# Patient Record
Sex: Female | Born: 1935 | Race: Black or African American | Hispanic: No | Smoking: Never smoker
Health system: Southern US, Community
[De-identification: ages and names within clinical notes are randomized; demographics above are authoritative.]

## PROBLEM LIST (undated history)

## (undated) DIAGNOSIS — Z8719 Personal history of other diseases of the digestive system: Secondary | ICD-10-CM

## (undated) DIAGNOSIS — J40 Bronchitis, not specified as acute or chronic: Secondary | ICD-10-CM

## (undated) DIAGNOSIS — R51 Headache: Secondary | ICD-10-CM

## (undated) DIAGNOSIS — F419 Anxiety disorder, unspecified: Secondary | ICD-10-CM

## (undated) DIAGNOSIS — J45909 Unspecified asthma, uncomplicated: Secondary | ICD-10-CM

## (undated) DIAGNOSIS — E119 Type 2 diabetes mellitus without complications: Secondary | ICD-10-CM

## (undated) DIAGNOSIS — D649 Anemia, unspecified: Secondary | ICD-10-CM

## (undated) DIAGNOSIS — I10 Essential (primary) hypertension: Secondary | ICD-10-CM

## (undated) DIAGNOSIS — I499 Cardiac arrhythmia, unspecified: Secondary | ICD-10-CM

## (undated) DIAGNOSIS — M1990 Unspecified osteoarthritis, unspecified site: Secondary | ICD-10-CM

## (undated) DIAGNOSIS — K219 Gastro-esophageal reflux disease without esophagitis: Secondary | ICD-10-CM

## (undated) DIAGNOSIS — E785 Hyperlipidemia, unspecified: Secondary | ICD-10-CM

## (undated) DIAGNOSIS — H269 Unspecified cataract: Secondary | ICD-10-CM

## (undated) DIAGNOSIS — G709 Myoneural disorder, unspecified: Secondary | ICD-10-CM

## (undated) HISTORY — PX: TUBAL LIGATION: SHX77

## (undated) HISTORY — PX: NECK SURGERY: SHX720

## (undated) HISTORY — PX: CARDIAC CATHETERIZATION: SHX172

## (undated) HISTORY — PX: DOPPLER ECHOCARDIOGRAPHY: SHX263

## (undated) HISTORY — PX: OTHER SURGICAL HISTORY: SHX169

---

## 1999-08-11 ENCOUNTER — Encounter: Payer: Self-pay | Admitting: Cardiology

## 1999-08-11 ENCOUNTER — Encounter: Admission: RE | Admit: 1999-08-11 | Discharge: 1999-08-11 | Payer: Self-pay | Admitting: Cardiology

## 1999-08-14 ENCOUNTER — Encounter: Payer: Self-pay | Admitting: Cardiology

## 1999-08-14 ENCOUNTER — Encounter: Admission: RE | Admit: 1999-08-14 | Discharge: 1999-08-14 | Payer: Self-pay | Admitting: Cardiology

## 1999-12-30 ENCOUNTER — Encounter (INDEPENDENT_AMBULATORY_CARE_PROVIDER_SITE_OTHER): Payer: Self-pay | Admitting: *Deleted

## 1999-12-30 ENCOUNTER — Ambulatory Visit (HOSPITAL_COMMUNITY): Admission: RE | Admit: 1999-12-30 | Discharge: 1999-12-30 | Payer: Self-pay | Admitting: Gastroenterology

## 2000-10-11 ENCOUNTER — Encounter: Payer: Self-pay | Admitting: Otolaryngology

## 2000-10-13 ENCOUNTER — Inpatient Hospital Stay (HOSPITAL_COMMUNITY): Admission: RE | Admit: 2000-10-13 | Discharge: 2000-10-14 | Payer: Self-pay | Admitting: Otolaryngology

## 2000-10-13 ENCOUNTER — Encounter (INDEPENDENT_AMBULATORY_CARE_PROVIDER_SITE_OTHER): Payer: Self-pay | Admitting: *Deleted

## 2001-05-10 ENCOUNTER — Encounter: Payer: Self-pay | Admitting: Cardiology

## 2001-05-10 ENCOUNTER — Encounter: Admission: RE | Admit: 2001-05-10 | Discharge: 2001-05-10 | Payer: Self-pay | Admitting: Cardiology

## 2002-10-06 ENCOUNTER — Ambulatory Visit: Admission: RE | Admit: 2002-10-06 | Discharge: 2002-10-06 | Payer: Self-pay | Admitting: Cardiology

## 2002-12-22 ENCOUNTER — Other Ambulatory Visit: Admission: RE | Admit: 2002-12-22 | Discharge: 2002-12-22 | Payer: Self-pay | Admitting: Obstetrics and Gynecology

## 2003-05-01 ENCOUNTER — Encounter: Admission: RE | Admit: 2003-05-01 | Discharge: 2003-05-01 | Payer: Self-pay | Admitting: Cardiology

## 2003-07-31 ENCOUNTER — Emergency Department (HOSPITAL_COMMUNITY): Admission: EM | Admit: 2003-07-31 | Discharge: 2003-07-31 | Payer: Self-pay | Admitting: Emergency Medicine

## 2003-08-13 ENCOUNTER — Ambulatory Visit (HOSPITAL_COMMUNITY): Admission: RE | Admit: 2003-08-13 | Discharge: 2003-08-13 | Payer: Self-pay | Admitting: Gastroenterology

## 2003-08-13 ENCOUNTER — Encounter (INDEPENDENT_AMBULATORY_CARE_PROVIDER_SITE_OTHER): Payer: Self-pay | Admitting: *Deleted

## 2003-08-30 ENCOUNTER — Ambulatory Visit (HOSPITAL_COMMUNITY): Admission: RE | Admit: 2003-08-30 | Discharge: 2003-08-30 | Payer: Self-pay | Admitting: Cardiology

## 2004-02-22 ENCOUNTER — Ambulatory Visit (HOSPITAL_COMMUNITY): Admission: RE | Admit: 2004-02-22 | Discharge: 2004-02-22 | Payer: Self-pay | Admitting: Gastroenterology

## 2004-09-17 ENCOUNTER — Ambulatory Visit (HOSPITAL_COMMUNITY): Admission: RE | Admit: 2004-09-17 | Discharge: 2004-09-17 | Payer: Self-pay | Admitting: Cardiology

## 2006-07-12 ENCOUNTER — Encounter: Admission: RE | Admit: 2006-07-12 | Discharge: 2006-07-12 | Payer: Self-pay | Admitting: Cardiology

## 2006-11-17 ENCOUNTER — Ambulatory Visit (HOSPITAL_COMMUNITY): Admission: RE | Admit: 2006-11-17 | Discharge: 2006-11-17 | Payer: Self-pay | Admitting: Gastroenterology

## 2007-02-10 ENCOUNTER — Encounter: Admission: RE | Admit: 2007-02-10 | Discharge: 2007-02-10 | Payer: Self-pay | Admitting: Cardiology

## 2007-06-08 ENCOUNTER — Ambulatory Visit (HOSPITAL_COMMUNITY): Admission: RE | Admit: 2007-06-08 | Discharge: 2007-06-08 | Payer: Self-pay | Admitting: Cardiology

## 2007-10-25 ENCOUNTER — Ambulatory Visit (HOSPITAL_COMMUNITY): Admission: RE | Admit: 2007-10-25 | Discharge: 2007-10-25 | Payer: Self-pay | Admitting: Cardiology

## 2007-10-27 ENCOUNTER — Encounter (INDEPENDENT_AMBULATORY_CARE_PROVIDER_SITE_OTHER): Payer: Self-pay | Admitting: Cardiology

## 2007-10-27 ENCOUNTER — Ambulatory Visit: Payer: Self-pay | Admitting: Vascular Surgery

## 2007-10-27 ENCOUNTER — Ambulatory Visit (HOSPITAL_COMMUNITY): Admission: RE | Admit: 2007-10-27 | Discharge: 2007-10-27 | Payer: Self-pay | Admitting: Cardiology

## 2009-07-12 ENCOUNTER — Encounter: Admission: RE | Admit: 2009-07-12 | Discharge: 2009-07-12 | Payer: Self-pay | Admitting: Cardiology

## 2010-03-13 ENCOUNTER — Encounter (INDEPENDENT_AMBULATORY_CARE_PROVIDER_SITE_OTHER): Payer: Self-pay | Admitting: Cardiology

## 2010-03-13 ENCOUNTER — Ambulatory Visit
Admission: RE | Admit: 2010-03-13 | Discharge: 2010-03-13 | Payer: Self-pay | Source: Home / Self Care | Admitting: Cardiology

## 2010-03-13 ENCOUNTER — Ambulatory Visit: Payer: Self-pay | Admitting: Vascular Surgery

## 2010-04-03 ENCOUNTER — Ambulatory Visit (HOSPITAL_COMMUNITY): Admission: RE | Admit: 2010-04-03 | Discharge: 2010-04-03 | Payer: Self-pay | Admitting: Gastroenterology

## 2010-06-29 ENCOUNTER — Encounter: Payer: Self-pay | Admitting: Cardiology

## 2010-10-09 ENCOUNTER — Inpatient Hospital Stay (HOSPITAL_BASED_OUTPATIENT_CLINIC_OR_DEPARTMENT_OTHER)
Admission: RE | Admit: 2010-10-09 | Discharge: 2010-10-09 | Disposition: A | Discharge status: Home / Self Care | Payer: Medicare Other | Source: Ambulatory Visit | Attending: Cardiology | Admitting: Cardiology

## 2010-10-09 DIAGNOSIS — I10 Essential (primary) hypertension: Secondary | ICD-10-CM | POA: Insufficient documentation

## 2010-10-09 DIAGNOSIS — K219 Gastro-esophageal reflux disease without esophagitis: Secondary | ICD-10-CM | POA: Insufficient documentation

## 2010-10-09 DIAGNOSIS — R42 Dizziness and giddiness: Secondary | ICD-10-CM | POA: Insufficient documentation

## 2010-10-09 DIAGNOSIS — I2510 Atherosclerotic heart disease of native coronary artery without angina pectoris: Secondary | ICD-10-CM | POA: Insufficient documentation

## 2010-10-09 DIAGNOSIS — E7800 Pure hypercholesterolemia, unspecified: Secondary | ICD-10-CM | POA: Insufficient documentation

## 2010-10-09 DIAGNOSIS — Z8673 Personal history of transient ischemic attack (TIA), and cerebral infarction without residual deficits: Secondary | ICD-10-CM | POA: Insufficient documentation

## 2010-10-09 DIAGNOSIS — R079 Chest pain, unspecified: Secondary | ICD-10-CM | POA: Insufficient documentation

## 2010-10-21 NOTE — Op Note (Signed)
Paula Pollard, Paula Pollard             ACCOUNT NO.:  [id number]   MEDICAL RECORD NO.:  [id number]          PATIENT TYPE:  AMB   LOCATION:  ENDO                         FACILITY:  [facility]   PHYSICIAN:  Anselmo Rod, M.D.  DATE OF BIRTH:  [birth year]   DATE OF PROCEDURE:  11/17/2006  DATE OF DISCHARGE:                               OPERATIVE REPORT   PROCEDURE PERFORMED:  Flexible sigmoidoscopy up to 90 cm.   ENDOSCOPIST:  Anselmo Rod, M.D.   INSTRUMENT USED:  Pentax video colonoscope.   INDICATION FOR PROCEDURE:  A 77 year old African-American female  undergoing a flexible sigmoidoscopy for acute abdominal pain.  Rule out  ischemic colitis, masses, polyps, etc.  The patient also has a family  history of colon cancer in her mother.   PREPROCEDURE PREPARATION:  Informed consent was procured from the  patient.  The patient was fasted for 8 hours prior to the procedure  after being prepped with a bottle of Miralax the night prior to the  procedure.  The risks and benefits of the procedure were discussed with  the patient in great detail.   PREPROCEDURE PHYSICAL:  VITAL SIGNS:  The patient had stable vital  signs.  NECK:  Supple.  CHEST:  Clear to auscultation.  S1, S2 regular.  ABDOMEN:  Soft with normal bowel sounds.   DESCRIPTION OF PROCEDURE:  The patient was placed in the left lateral  decubitus position and sedated with an additional 25 mcg of fentanyl and  2.5 mg of Versed given intravenously in slow incremental doses.  Once  the patient was adequately sedate and maintained on low-flow oxygen and  continuous cardiac monitoring, the Pentax video colonoscope was advanced  from the rectum to 90 cm without difficulty.  The patient had a fairly  good prep.  A few scattered diverticula were seen.  Small internal  hemorrhoids were appreciated.  No masses or polyps were identified.  The  procedure was done up to the mid-transverse colon.  The patient  tolerated the procedure well  without immediate complication.   IMPRESSION:  1. Few scattered diverticula seen up to 90 cm.  No other masses or      polyps were identified.  2. No evidence of ischemic colitis.  3. Small internal hemorrhoids seen on retroflexion in the rectum.   RECOMMENDATIONS:  1. Continue a high-fiber diet.  2. Avoid all nonsteroidals for now.  3. Outpatient follow-up as the need arises in the future.      Anselmo Rod, M.D.  Electronically Signed     JNM/MEDQ  D:  11/18/2006  T:  11/19/2006  Job:  [id number]   cc:   Osvaldo Shipper. Spruill, M.D.  Fax: [phone]

## 2010-10-24 NOTE — Op Note (Signed)
[place]  Patient:    Paula Pollard, Paula Pollard                    MRN: [id number] Proc. Date: 10/13/00 Adm. Date:  [id number] Attending:  Barbee Cough                           Operative Report  PREOPERATIVE DIAGNOSIS:  Right deep lobe parotid mass.  POSTOPERATIVE DIAGNOSIS:  Right deep lobe parotid mass.  OPERATION PERFORMED:  Right deep lobe parotidectomy with facial nerve dissection and continuous facial nerve monitoring.  SURGEON:  Kinnie Scales. Annalee Genta, M.D.  ASSISTANT:  Veverly Fells. Arletha Grippe, M.D.  ANESTHESIA:  General endotracheal.  COMPLICATIONS:  None.  ESTIMATED BLOOD LOSS:  Approximately 100 cc.  The patient was transferred from the operating room to the recovery room in stable condition.  INDICATIONS FOR PROCEDURE:  Ms. Hackley is a 77 year old white female who was referred for evaluation of a gradually enlarging mass in the right superior neck.  Evaluation revealed a firm, mobile mass posterior to the angle of the mandible.  MRI scanning was performed.  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  The patient had a past medical history which included mild hypertension and coronary artery disease.  Prior to surgery she was cleared for general anesthesia by her primary care physician, Dr. Shana Chute, given the patients findings of a gradually enlarging mass involving the right parotid gland and the MRI scan, I recommended to undertake parotidectomy under general anesthesia with nerve monitoring.  The risks, benefits and possible complications of this procedure were discussed in detail with the patient and family prior to surgery.  They understood and concurred with our plan for surgery which was scheduled as above.  DESCRIPTION OF PROCEDURE:  The patient was brought to the operating room at [facility] on Oct 13, 2000 and placed in supine position on the operating table.  General endotracheal anesthesia was established without difficulty.  When the patient had adequately been anesthetized, she was injected with 4 cc of 1% lidocaine, 1:100,000 epinephrine injected in the pre-existing skin crease along the anterior preauricular sulcus and extending into the superior aspect of the neck.  The patient was then prepped and draped in a sterile fashion and the nerve integrity monitoring system (NIMS) was placed and tested.  The procedure was begun by using a 15 scalpel to incise the skin along the preauricular sulcus extending below the right ear lobe and into the superior aspect of the right neck in a horizontal fashion in the pre-existing skin crease.  A musculocutaneous flap was then elevated over the anterior superficial periparotid fascia with skin and muscle reflected anteriorly.  The parotid gland was then delineated from the preauricular sulcus and auricular tragus.  Dissection was carried from superficial to deep along the tragal cartilage and then extended into the superior aspect of the neck.  The anterior belly of the digastric muscle was identified and mobilized posteriorly.  The gland was mobilized anterior and superficially.  This allowed access to the deep structures of the neck including the anterior and posterior bellies of the digastric muscle which were identified.  The overlying soft tissue was divided and elevated.  The greater auricular nerve was identified and divided and the common facial vein was divided in the deep  aspect of the neck.  The tumorous mass was identified in the deep lobe of the parotid extending from the deep lobe and elevating the inferior division of the facial nerve superiorly.  Attention was then turned to the preauricular sulcus and the common trunk of the facial nerve was identified as it exited the thyromastoid foramen.  Dissection was then carried out into the  right parotid gland.  Superior inferior divisions of the facial nerve were identified and the inferior division was followed throughout its course. Multiple branches were identified and preserved including cervical, marginal mandibular and buccal branches.  Using the NIMS monitor throughout this portion of the case, nerves were stimulated, followed and preserved.  The overlying parotid gland was dissected out laterally and large bulky posterior deep lobe parotid tumor was reflected inferiorly.  The mass was then resected from the deep aspect of the parotid gland and sent to pathology for gross and microscopic evaluation, common facial vein was again identified at the superior aspect of the dissection, divided and suture ligated, inferior and posterior aspects of the parotid gland were resected along with the deep lobe tumor.  The wound was then thoroughly irrigated with saline solution and the wound was then approximated in multiple layers after placement of a 7 mm Blake drain at the base of the incision which was carried out through a separate stab incision and sutured in position with a 2-0 silk suture.  Closure was achieved by reapproximating the periparotid fascia with the anterior aspect of the sternocleidomastoid muscle and preauricular fascia.  The deep skin closure was achieved with a 4-0 and 5-0 Vicryl suture in interrupted fashion.  Final skin closure achieved with a 4-0 and 5-0 Ethilon suture in a running locked fashion.  The patients wound was then cleaned and dressed with bacitracin ointment.  She was then awakened from her anesthetic, extubated without difficulty and was transferred from the operating room to the recovery room in stable condition.  There were no complications.  Estimated blood loss was 100 cc. DD:  10/13/00 TD:  10/13/00 Job: [id number]

## 2010-10-24 NOTE — Op Note (Signed)
Paula Pollard, Paula Pollard             ACCOUNT NO.:  [id number]   MEDICAL RECORD NO.:  [id number]          PATIENT TYPE:  AMB   LOCATION:  ENDO                         FACILITY:  [facility]   PHYSICIAN:  Anselmo Rod, M.D.  DATE OF BIRTH:  07-15-1929   DATE OF PROCEDURE:  11/18/2006  DATE OF DISCHARGE:                               OPERATIVE REPORT   PROCEDURE PERFORMED:  Esophagogastroduodenoscopy with biopsies.   ENDOSCOPIST:  Anselmo Rod, M.D.   INSTRUMENT USED:  Pentax video panendoscope.   INDICATIONS FOR PROCEDURE:  Guaiac positive stool and abdominal pain in  a 77 year old African American female, rule out peptic ulcer disease,  esophagitis, gastritis, gastroparesis.   PREPROCEDURE PREPARATION:  Informed consent was procured from the  patient.  The patient fasted for eight hours prior to the procedure.  Risks and benefits of the procedure were discussed with her in great  detail.   PREPROCEDURE PHYSICAL EXAMINATION:  VITAL SIGNS:  Stable.  NECK:  Supple.  CHEST:  Clear to auscultation.  CARDIOVASCULAR:  S1 and S2 regular.  ABDOMEN:  Soft with normal bowel sounds.   DESCRIPTION OF PROCEDURE:  The patient was placed in left lateral  decubitus position, sedated with 50 mcg of Fentanyl and 5 mg of Versed  given intravenously in slow incremental doses.  Once the patient was  adequately sedated and maintained on low flow oxygen and continuous  cardiac monitoring, the Pentax video panendoscope was advanced through  the mouthpiece, over the tongue and into the esophagus under direct  vision.  The entire esophagus was widely patent with no evidence of  ring, stricture, masses, esophagitis or Barrett's mucosa.  A small  hiatal hernia was seen on high retroflexion.  The entire gastric mucosa  of the proximal small-bowel appeared normal.   IMPRESSION:  Normal esophagogastroduodenoscopy except for a small hiatal  hernia.  No ulcers, erosions, masses or polyps seen.   RECOMMENDATIONS:  Proceed with flexible sigmoidoscopy at this time.  Further recommendations will be made in follow-up.  Avoid all  nonsteroidals for now.      Anselmo Rod, M.D.  Electronically Signed     JNM/MEDQ  D:  11/18/2006  T:  11/19/2006  Job:  [id number]   cc:   Osvaldo Shipper. Spruill, M.D.  Fax: [phone]

## 2010-10-24 NOTE — Op Note (Signed)
NAME:  Paula Pollard, Paula Pollard                       ACCOUNT NO.:  [id number]   MEDICAL RECORD NO.:  [id number]                   PATIENT TYPE:  AMB   LOCATION:  ENDO                                 FACILITY:  [facility]   PHYSICIAN:  Anselmo Rod, M.D.               DATE OF BIRTH:  12-26-1929   DATE OF PROCEDURE:  02/22/2004  DATE OF DISCHARGE:                                 OPERATIVE REPORT   PROCEDURE:  Esophagogastroduodenoscopy.   ENDOSCOPIST:  Anselmo Rod, M.D.   INSTRUMENT USED:  Olympus video pan endoscope.   INDICATIONS FOR PROCEDURE:  This is a 77 year old African-American female  with a history of epigastric pain and rectal bleeding.  Rule out peptic  ulcer disease, esophagitis, gastritis, etc.   PRE-PROCEDURE PREPARATION:  Informed consent was procured from the patient.  The patient fasted for eight hours prior to the procedure and prepped with a  bottle of magnesium citrate and a gallon of GoLYTELY the night prior to the  procedure.   PRE-PROCEDURE PHYSICAL:  VITAL SIGNS: Stable.  NECK: Supple.  CHEST:  Clear to auscultation.  CARDIOVASCULAR:  S1 and S2 are regular.  ABDOMEN:  Soft with normal bowel sounds.   DESCRIPTION OF PROCEDURE:  The patient was placed in the left lateral  decubitus position and sedated with 50 mg of Demerol and 5 mg of Versed in  slow incremental doses.  Once the patient was adequately sedated and  maintained on low flow oxygen and continuous cardiac monitoring, the Olympus  pan endoscope was advanced through the mouth piece, over the tongue and into  the esophagus under direct vision.  The entire esophagus appeared normal  with no evidence of ring, stricture, masses, esophagitis or Barrett's  mucosa.  The scope was then advanced into the stomach.  A small hiatal  hernia was seen on high retroflexion.  The entire gastric mucosa of the  proximal small bowel appeared normal.   IMPRESSION:  Normal esophagogastroduodenoscopy except for a small  hiatal  hernia.  No ulcers, masses or polyps seen.   RECOMMENDATIONS:  1.  Continue proton pump inhibitor.  2.  Avoid non-steroidals including aspirin for now.  3.  Outpatient follow-up within the next two weeks for further      recommendations.      JNM/MEDQ  D:  02/22/2004  T:  02/23/2004  Job:  [id number]   cc:   Osvaldo Shipper. Spruill, M.D.  [address] [address]  [place]  [place] [address]  Fax: [phone]

## 2010-10-24 NOTE — Op Note (Signed)
NAME:  Paula Pollard, Paula Pollard                       ACCOUNT NO.:  [id number]   MEDICAL RECORD NO.:  [id number]                   PATIENT TYPE:  AMB   LOCATION:  ENDO                                 FACILITY:  [facility]   PHYSICIAN:  Anselmo Rod, M.D.               DATE OF BIRTH:  1930-05-06   DATE OF PROCEDURE:  08/13/2003  DATE OF DISCHARGE:                                 OPERATIVE REPORT   PROCEDURE PERFORMED:  Colonoscopy with snare polypectomy x1.   ENDOSCOPIST:  Anselmo Rod, M.D.   INSTRUMENT USED:  Olympus video colonoscope.   INDICATION FOR PROCEDURE:  A 77 year old African-American female with a  history of colon cancer in her mother and a personal history of adenomatous  polyps with occasional rectal bleeding, undergoing a repeat colonoscopy to  rule out adenomas, masses, etc.   PREPROCEDURE PREPARATION:  Informed consent was procured from the patient.  The patient was fasted for eight hours prior to the procedure and prepped  with a bottle of magnesium citrate and a gallon of GoLYTELY the night prior  to the procedure.   PREPROCEDURE PHYSICAL:  VITAL SIGNS:  The patient had stable vital signs.  NECK:  Supple.  CHEST:  Clear to auscultation.  S1, S2 regular.  ABDOMEN:  Soft with normal bowel sounds.   DESCRIPTION OF PROCEDURE:  The patient was placed in the left lateral  decubitus position and sedated with 50 mg of Demerol and 5 mg of Versed  intravenously.  Once the patient was adequately sedate and maintained on low-  flow oxygen and continuous cardiac monitoring, the Olympus video colonoscope  was advanced from the rectum to the cecum.  A flat polyp was snared from the  proximal right colon.  There were scattered diverticula seen throughout the  colon.  The patient had small internal hemorrhoids seen on retroflexion.  There was a large amount of residual stool in the colon.  Multiple washes  were done.  The patient's position was changed from the left lateral to  the  supine and the right lateral position with gentle application of abdominal  pressure to reach the cecum.  The patient tolerated the procedure well  without complications.  Small lesions could have been missed.   IMPRESSION:  1. Flat polyp snared from proximal right colon.  2. Scattered diverticulosis.  3. Small internal hemorrhoids.   RECOMMENDATIONS:  1. Await pathology results.  2. Avoid nonsteroidals including aspirin for the next four weeks.  3. Outpatient follow-up in the next two weeks for further recommendations.                                               Anselmo Rod, M.D.    JNM/MEDQ  D:  08/13/2003  T:  08/14/2003  Job:  [id number]  cc:   Osvaldo Shipper. Spruill, M.D.  [address] [address]  [place]  [place] [address]  Fax: [phone]

## 2010-10-24 NOTE — Procedures (Signed)
[place]. [facility]  Patient:    Paula Pollard, Paula Pollard                    MRN: [id number] Proc. Date: 12/30/99 Attending:  Anselmo Rod, M.D. Dictator:   Anselmo Rod, M.D. CC:         Osvaldo Shipper. Spruill, M.D.                           Procedure Report  DATE OF BIRTH: June 28, 2029  PROCEDURE PERFORMED:  Colonoscopy with hot biopsies.  ENDOSCOPIST:  Anselmo Rod, M.D.  INSTRUMENT USED:  Olympus video colonoscope.  INDICATIONS:  Personal history of polyps, and a family history of colon cancer in a 77 year old black female, rule out recurrent polyps.  PREPROCEDURE PREPARATION:  Informed consent was received from the patient. The patient was fasted for eight hours prior to the procedure and prepped with a bottle of magnesium citrate and a gallon of NuLYTELY the night prior to the procedure.  PREPROCEDURE PHYSICAL EXAMINATION:  VITAL SIGNS:  Stable.  NECK:  Supple.  CHEST:  Clear to auscultation.  S1 and S2 regular.  ABDOMEN:  Soft, normal abdominal bowel sounds.  DESCRIPTION OF PROCEDURE:  The patient was placed in the left lateral decubitus position and sedated with 70 mg of Demerol and 7 mg of Versed intravenously.  Once the patient was adequately sedated and maintained on low flow oxygen and continuous cardiac monitoring, the Olympus video colonoscope was advanced in the rectum to the cecum without difficulty.  There were two small flat polyps seen in the right colon just distal to the cecum.  These were biopsied with by hot biopsy forceps for pathology.  No other masses or polyps were present.  There was some red stool still in the colon, and some left-sided diverticulosis noticed on examination.  The patient tolerated the procedure well without complication.  IMPRESSION: 1. Left-sided diverticulosis. 2. No large masses or polyps present. 3. Flat polyps present at right colon just distal to the cecum, biopsied with    hot biopsy forceps  for pathology. 4. Some residual stool in the colon, very small lesion.  RECOMMENDATIONS: 1. Avoid nonsteroidals. 2. Await pathology results. 3. Outpatient follow up in the next two weeks. DD:  12/30/99 TD:  12/31/99 Job: [id number]

## 2010-10-24 NOTE — Discharge Summary (Signed)
. [facility]  Patient:    Paula Pollard, Paula Pollard                    MRN: [id number] Adm. Date:  [id number] Disc. Date: [id number] Attending:  Barbee Cough CC:         Osvaldo Shipper. Spruill, M.D.   Discharge Summary  CONDITION AT DISCHARGE:  The patient is discharged to home in stable condition.  ADMISSION DIAGNOSIS:  Right parotid tumor.  DISCHARGE DIAGNOSIS:  Right parotid tumor.  SURGICAL PROCEDURE:  Right total parotidectomy with facial nerve dissection on Oct 13, 2000.  DISPOSITION:  The patient is discharged to home in stable condition in the company of her family.  DISCHARGE MEDICATIONS: Include her preoperative medications: 1. Dyazide one tablet p.o. q.d. 2. Tenormin 50 mg p.o. q.d. 3. Ultram one p.o. q.p.m. p.r.n. 4. Elavil 25 mg p.o. q.h.s. 5. Prevacid 30 mg p.o. q.d. 6. Prevacid 10 mg p.o. q.d.  Additional discharge medications include: 1. Augmentin 500 mg p.o. b.i.d. 2. Percocet 5/325 one to two tablets q.4-6h. p.r.n. for pain control,    dispensed #30 without refills.  DISCHARGE INSTRUCTIONS:  Activity:  Limited.  No lifting or straining.  Diet: No restrictions.  Wound care:  Half strength hydrogen peroxide followed by bacitracin ointment b.i.d.  The patient may bathe on Oct 16, 2000.  DISCHARGE FOLLOWUP:  She will follow up in my office on Oct 19, 2000, for additional postoperative care or sooner if warranted.  BRIEF ADMISSION HISTORY:  Paula Pollard is a 77 year old black female who was referred for evaluation of a gradually enlarging right superior neck mass. Evaluation including MRI scan showed an intraparotid tumor measuring approximately 2 x 3 cm in the deep posterior aspect of the right parotid gland.  Given the progressive nature of the patients parotid tumor, I recommended that we undertake right total parotidectomy.  The risks, benefits, and possible complications of this procedure were discussed in detail with  Paula Pollard and her family, they understood, and concurred with our plan.  Prior to surgery, medical evaluation and preoperative clearance was undertaken by the patients primary care physician, Dr. Donia Guiles.  HOSPITAL COURSE:  The patient was admitted to the ENT service on Oct 13, 2000. She was taken to the main operating room at [facility] and underwent a right total parotidectomy with facial nerve dissection under general anesthesia.  The patient was transferred from the operating room to the recovery room and from recovery to unit 5700 for postoperative care.  There were no intraoperative or postoperative complications or problems, and the patient tolerated her surgical procedure and general anesthesia without difficulty.  She was monitored for the first 23 hours after surgery and doing very well.  The patients JP drain output was monitored, and this fell rapidly after the first 8-hour period.  JP is removed on the first postoperative morning, Oct 14, 2000, and the patient is discharged to home in stable condition.  Examination reveals the parotid suture line to be intact with minimal crusting; no erythema, swelling, or discharge; and the patients facial nerve function is normal.  Vital signs are stable.  The patient has normal bowel and bladder function.  Pain control is adequate with Percocet, and she is tolerating a soft oral diet without difficulty.  She is discharged to home in stable condition with the above Discharge Instructions.  She is comfortable with this discharge planning and will follow up in my office in approximately  one week for further postoperative care. DD:  10/14/00 TD:  10/15/00 Job: [id number]

## 2010-10-26 NOTE — Cardiovascular Report (Signed)
Paula Pollard, Paula Pollard              ACCOUNT NO.:  [id number]  MEDICAL RECORD NO.:  [id number]          PATIENT TYPE:  LOCATION:                                 FACILITY:  PHYSICIAN:  Halina Asano N. Sharyn Lull, M.D. DATE OF BIRTH:  [birth year]  DATE OF PROCEDURE:  10/09/2010 DATE OF DISCHARGE:                           CARDIAC CATHETERIZATION   __________ left and right coronary angiography, LV graphy via right groin using Judkins technique.  INDICATIONS FOR PROCEDURE:  Paula Pollard is a 77 year old black female with past medical history significant for hypertension, hypercholesteremia, GERD, history of lacunar infarct in the past, complains of vague retrosternal chest pain radiating to the throat associated with feeling tired and weak and dizzy.  Denies any palpitation, lightheadedness, or syncope.  Denies nausea, vomiting, or diaphoresis.  States chest pain relieves with rest.  Denies relation of chest pain to food, breathing, or movement.  Denies any recent cardiac workup.  PAST MEDICAL HISTORY:  As above.  PAST SURGICAL HISTORY: 1. She had neck surgery in the past. 2. She had tubal ligation in the past.  MEDICATIONS AT HOME:  She is on: 1. Triamterene/hydrochlorothiazide 37.5/25 mg p.o. daily. 2. Losartan 100 mg p.o. daily. 3. Crestor 10 mg half tablet daily. 4. Enteric-coated aspirin 81 mg p.o. daily. 5. Dexilant 60 mg p.o. daily. 6. Vitamin D3, 1000 units daily.  ALLERGIES:  She is allergic to CODEINE.  SOCIAL HISTORY:  She is divorced, 4 children.  No history of alcohol or tobacco abuse.  She is retired.  FAMILY HISTORY:  Positive for coronary artery disease.  Father died of MI at the age of 87.  Mother died of old age at the age of 25, she also had MI.  One brother had cancer.  PHYSICAL EXAMINATION:  GENERAL:  She is alert, awake, oriented x3, in no acute distress. VITAL SIGNS:  Blood pressure was 136/90, pulse was 72 and regular. HEENT:  Conjunctivae were pink. NECK:   Supple.  No JVD, no bruit. LUNGS:  Clear to auscultation without rhonchi or rales. CARDIOVASCULAR:  S1, S2 was normal.  There was soft systolic murmur. ABDOMEN:  Soft.  Bowel sounds were present, nontender. EXTREMITIES:  There was no clubbing, cyanosis, or edema.  IMPRESSION:  Chest pain, rule out coronary insufficiency, hypertension, hypercholesteremia, gastroesophageal reflux disease, history of lacunar infarct.  Discussed with the patient regarding noninvasive stress testing versus left cath, its risks and benefits, i.e., death, MI, stroke, need for emergency coronary artery bypass graft, local vascular complications, etc., and consented for left catheterization.  PROCEDURE:  After obtaining the informed consent, the patient was brought to the cath lab and was placed on fluoroscopy table.  Right groin was prepped and draped in usual fashion.  Xylocaine 1% was used for local anesthesia in the right groin.  With the help of thin-wall needle, 4-French arterial sheath was placed.  The sheath was aspirated and flushed.  Next, 4-French left Judkins catheter was advanced over the wire under fluoroscopic guidance into the ascending aorta.  Wire was pulled out, the catheter was aspirated and connected to the manifold. Catheter was further advanced and engaged into left  coronary ostium. Multiple views of the left system were taken.  Next, catheter was disengaged and was pulled out over the wire and was replaced with 4- [place] 3-D right diagnostic catheter which was advanced over the wire under fluoroscopic guidance up to the ascending aorta.  Wire was pulled out, the catheter was aspirated and connected to the manifold.  Catheter was further advanced across the aortic valve into the LV.  LV pressures were recorded.  Next, LV graphy was done in 30-degree RAO position. Postangiographic pressures were recorded from LV and then pullback pressures were recorded from the aorta.  There was no  gradient across the aortic valve.  Next, the pigtail catheter was pulled out over the wire.  Sheaths were aspirated and flushed.  FINDINGS:  LV showed good LV systolic function, EF of 55-60%.  Left main was patent.  LAD has 10-15% proximal and mid stenosis.  Diagonal 1 is small which is patent.  Diagonal 2 is very very small.  Ramus is long which is patent.  Left circumflex is small which is patent.  OM1 is small which is patent.  RCA is patent.  PDA and PLV branches were also patent.  The patient tolerated the procedure well.  There were no complications.  The patient was transferred to recovery room in stable condition.     Eduardo Osier. Sharyn Lull, M.D.     MNH/MEDQ  D:  10/09/2010  T:  10/09/2010  Job:  [id number]  Electronically Signed by Rinaldo Cloud M.D. on 10/26/2010 08:56:10 PM

## 2011-05-22 ENCOUNTER — Other Ambulatory Visit: Payer: Self-pay | Admitting: Cardiology

## 2011-07-28 DIAGNOSIS — I10 Essential (primary) hypertension: Secondary | ICD-10-CM | POA: Diagnosis not present

## 2011-07-28 DIAGNOSIS — E7800 Pure hypercholesterolemia, unspecified: Secondary | ICD-10-CM | POA: Diagnosis not present

## 2011-07-28 DIAGNOSIS — I2510 Atherosclerotic heart disease of native coronary artery without angina pectoris: Secondary | ICD-10-CM | POA: Diagnosis not present

## 2011-09-17 DIAGNOSIS — Z01419 Encounter for gynecological examination (general) (routine) without abnormal findings: Secondary | ICD-10-CM | POA: Diagnosis not present

## 2011-09-17 DIAGNOSIS — Z124 Encounter for screening for malignant neoplasm of cervix: Secondary | ICD-10-CM | POA: Diagnosis not present

## 2011-10-23 DIAGNOSIS — H26019 Infantile and juvenile cortical, lamellar, or zonular cataract, unspecified eye: Secondary | ICD-10-CM | POA: Diagnosis not present

## 2011-10-23 DIAGNOSIS — H2510 Age-related nuclear cataract, unspecified eye: Secondary | ICD-10-CM | POA: Diagnosis not present

## 2011-10-27 DIAGNOSIS — K219 Gastro-esophageal reflux disease without esophagitis: Secondary | ICD-10-CM | POA: Diagnosis not present

## 2011-10-27 DIAGNOSIS — I2510 Atherosclerotic heart disease of native coronary artery without angina pectoris: Secondary | ICD-10-CM | POA: Diagnosis not present

## 2011-10-27 DIAGNOSIS — I10 Essential (primary) hypertension: Secondary | ICD-10-CM | POA: Diagnosis not present

## 2011-10-27 DIAGNOSIS — E7800 Pure hypercholesterolemia, unspecified: Secondary | ICD-10-CM | POA: Diagnosis not present

## 2011-11-23 DIAGNOSIS — M949 Disorder of cartilage, unspecified: Secondary | ICD-10-CM | POA: Diagnosis not present

## 2011-11-23 DIAGNOSIS — M899 Disorder of bone, unspecified: Secondary | ICD-10-CM | POA: Diagnosis not present

## 2011-11-23 DIAGNOSIS — Z1231 Encounter for screening mammogram for malignant neoplasm of breast: Secondary | ICD-10-CM | POA: Diagnosis not present

## 2011-12-07 DIAGNOSIS — E559 Vitamin D deficiency, unspecified: Secondary | ICD-10-CM | POA: Diagnosis not present

## 2012-01-26 DIAGNOSIS — I10 Essential (primary) hypertension: Secondary | ICD-10-CM | POA: Diagnosis not present

## 2012-01-26 DIAGNOSIS — E7800 Pure hypercholesterolemia, unspecified: Secondary | ICD-10-CM | POA: Diagnosis not present

## 2012-01-26 DIAGNOSIS — I2510 Atherosclerotic heart disease of native coronary artery without angina pectoris: Secondary | ICD-10-CM | POA: Diagnosis not present

## 2012-02-22 DIAGNOSIS — M545 Low back pain, unspecified: Secondary | ICD-10-CM | POA: Diagnosis not present

## 2012-02-22 DIAGNOSIS — M169 Osteoarthritis of hip, unspecified: Secondary | ICD-10-CM | POA: Diagnosis not present

## 2012-02-22 DIAGNOSIS — M25569 Pain in unspecified knee: Secondary | ICD-10-CM | POA: Diagnosis not present

## 2012-02-29 DIAGNOSIS — M25569 Pain in unspecified knee: Secondary | ICD-10-CM | POA: Diagnosis not present

## 2012-03-07 DIAGNOSIS — M169 Osteoarthritis of hip, unspecified: Secondary | ICD-10-CM | POA: Diagnosis not present

## 2012-03-07 DIAGNOSIS — M25569 Pain in unspecified knee: Secondary | ICD-10-CM | POA: Diagnosis not present

## 2012-03-09 DIAGNOSIS — M545 Low back pain, unspecified: Secondary | ICD-10-CM | POA: Diagnosis not present

## 2012-03-14 DIAGNOSIS — M545 Low back pain, unspecified: Secondary | ICD-10-CM | POA: Diagnosis not present

## 2012-03-16 DIAGNOSIS — M545 Low back pain, unspecified: Secondary | ICD-10-CM | POA: Diagnosis not present

## 2012-03-21 DIAGNOSIS — M25569 Pain in unspecified knee: Secondary | ICD-10-CM | POA: Diagnosis not present

## 2012-03-21 DIAGNOSIS — M169 Osteoarthritis of hip, unspecified: Secondary | ICD-10-CM | POA: Diagnosis not present

## 2012-03-21 DIAGNOSIS — M545 Low back pain, unspecified: Secondary | ICD-10-CM | POA: Diagnosis not present

## 2012-04-26 DIAGNOSIS — E7800 Pure hypercholesterolemia, unspecified: Secondary | ICD-10-CM | POA: Diagnosis not present

## 2012-04-26 DIAGNOSIS — I10 Essential (primary) hypertension: Secondary | ICD-10-CM | POA: Diagnosis not present

## 2012-04-26 DIAGNOSIS — I2510 Atherosclerotic heart disease of native coronary artery without angina pectoris: Secondary | ICD-10-CM | POA: Diagnosis not present

## 2012-05-09 DIAGNOSIS — E7800 Pure hypercholesterolemia, unspecified: Secondary | ICD-10-CM | POA: Diagnosis not present

## 2012-05-09 DIAGNOSIS — I10 Essential (primary) hypertension: Secondary | ICD-10-CM | POA: Diagnosis not present

## 2012-05-09 DIAGNOSIS — I2510 Atherosclerotic heart disease of native coronary artery without angina pectoris: Secondary | ICD-10-CM | POA: Diagnosis not present

## 2012-05-13 DIAGNOSIS — M19079 Primary osteoarthritis, unspecified ankle and foot: Secondary | ICD-10-CM | POA: Diagnosis not present

## 2012-05-13 DIAGNOSIS — M25579 Pain in unspecified ankle and joints of unspecified foot: Secondary | ICD-10-CM | POA: Diagnosis not present

## 2012-05-13 DIAGNOSIS — M1710 Unilateral primary osteoarthritis, unspecified knee: Secondary | ICD-10-CM | POA: Diagnosis not present

## 2012-05-13 DIAGNOSIS — M25569 Pain in unspecified knee: Secondary | ICD-10-CM | POA: Diagnosis not present

## 2012-06-06 DIAGNOSIS — M1710 Unilateral primary osteoarthritis, unspecified knee: Secondary | ICD-10-CM | POA: Diagnosis not present

## 2012-06-16 DIAGNOSIS — M169 Osteoarthritis of hip, unspecified: Secondary | ICD-10-CM | POA: Diagnosis not present

## 2012-06-16 DIAGNOSIS — M1710 Unilateral primary osteoarthritis, unspecified knee: Secondary | ICD-10-CM | POA: Diagnosis not present

## 2012-07-22 ENCOUNTER — Encounter (HOSPITAL_COMMUNITY): Payer: Self-pay | Admitting: Pharmacy Technician

## 2012-07-25 ENCOUNTER — Other Ambulatory Visit: Payer: Self-pay | Admitting: Orthopedic Surgery

## 2012-07-25 ENCOUNTER — Encounter (HOSPITAL_COMMUNITY): Payer: Self-pay

## 2012-07-25 ENCOUNTER — Ambulatory Visit (HOSPITAL_COMMUNITY)
Admission: RE | Admit: 2012-07-25 | Discharge: 2012-07-25 | Disposition: A | Discharge status: Home / Self Care | Payer: Medicare Other | Source: Ambulatory Visit | Attending: Anesthesiology | Admitting: Anesthesiology

## 2012-07-25 ENCOUNTER — Encounter (HOSPITAL_COMMUNITY)
Admission: RE | Admit: 2012-07-25 | Discharge: 2012-07-25 | Disposition: A | Discharge status: Home / Self Care | Payer: Medicare Other | Source: Ambulatory Visit | Attending: Orthopedic Surgery | Admitting: Orthopedic Surgery

## 2012-07-25 DIAGNOSIS — I4460 Unspecified fascicular block: Secondary | ICD-10-CM | POA: Diagnosis not present

## 2012-07-25 DIAGNOSIS — Z01810 Encounter for preprocedural cardiovascular examination: Secondary | ICD-10-CM | POA: Insufficient documentation

## 2012-07-25 DIAGNOSIS — R9431 Abnormal electrocardiogram [ECG] [EKG]: Secondary | ICD-10-CM | POA: Insufficient documentation

## 2012-07-25 DIAGNOSIS — Z01812 Encounter for preprocedural laboratory examination: Secondary | ICD-10-CM | POA: Diagnosis not present

## 2012-07-25 DIAGNOSIS — Z01818 Encounter for other preprocedural examination: Secondary | ICD-10-CM | POA: Diagnosis not present

## 2012-07-25 DIAGNOSIS — I10 Essential (primary) hypertension: Secondary | ICD-10-CM | POA: Insufficient documentation

## 2012-07-25 HISTORY — DX: Bronchitis, not specified as acute or chronic: J40

## 2012-07-25 HISTORY — DX: Essential (primary) hypertension: I10

## 2012-07-25 HISTORY — DX: Anemia, unspecified: D64.9

## 2012-07-25 HISTORY — DX: Cardiac arrhythmia, unspecified: I49.9

## 2012-07-25 HISTORY — DX: Hyperlipidemia, unspecified: E78.5

## 2012-07-25 HISTORY — DX: Headache: R51

## 2012-07-25 HISTORY — DX: Anxiety disorder, unspecified: F41.9

## 2012-07-25 HISTORY — DX: Myoneural disorder, unspecified: G70.9

## 2012-07-25 HISTORY — DX: Unspecified cataract: H26.9

## 2012-07-25 HISTORY — DX: Gastro-esophageal reflux disease without esophagitis: K21.9

## 2012-07-25 HISTORY — DX: Unspecified osteoarthritis, unspecified site: M19.90

## 2012-07-25 LAB — BASIC METABOLIC PANEL
CO2: 30 mEq/L (ref 19–32)
Chloride: 96 mEq/L (ref 96–112)
Creatinine, Ser: 0.8 mg/dL (ref 0.50–1.10)
Potassium: 3.3 mEq/L — ABNORMAL LOW (ref 3.5–5.1)
Sodium: 137 mEq/L (ref 135–145)

## 2012-07-25 LAB — CBC
HCT: 39.6 % (ref 36.0–46.0)
Hemoglobin: 13 g/dL (ref 12.0–15.0)
MCV: 89.6 fL (ref 78.0–100.0)
RBC: 4.42 MIL/uL (ref 3.87–5.11)
WBC: 4.9 10*3/uL (ref 4.0–10.5)

## 2012-07-25 LAB — SURGICAL PCR SCREEN: MRSA, PCR: NEGATIVE

## 2012-07-25 LAB — PROTIME-INR: Prothrombin Time: 12.9 seconds (ref 11.6–15.2)

## 2012-07-25 LAB — TYPE AND SCREEN: Antibody Screen: NEGATIVE

## 2012-07-25 LAB — APTT: aPTT: 32 seconds (ref 24–37)

## 2012-07-25 NOTE — Progress Notes (Signed)
Faxed request to Dr. Annitta Jersey office, requesting EKG, Echo, Stress and consult note for upcoming visit with patient on 07/26/12.  Forwarded to anesthesia for review.

## 2012-07-25 NOTE — Pre-Procedure Instructions (Signed)
Paula Pollard  07/25/2012   Your procedure is scheduled on:  Monday August 08, 2012  Report to [facility] at8:10AM.  Call this number if you have problems the morning of surgery: [phone]   Remember:   Do not eat food or drink liquids after midnight.   Take these medicines the morning of surgery with A SIP OF WATER: bystolic, tramadol meclizine, omeprazole, pain medicine(if needed), lorazepam   Do not wear jewelry, make-up or nail polish.  Do not wear lotions, powders, or perfumes.  Do not shave 48 hours prior to surgery.  Do not bring valuables to the hospital.  Contacts, dentures or bridgework may not be worn into surgery.  Leave suitcase in the car. After surgery it may be brought to your room.  For patients admitted to the hospital, checkout time is 11:00 AM the day of  discharge.   Patients discharged the day of surgery will not be allowed to drive  home.  Name and phone number of your driver: family / friend  Special Instructions: Shower using CHG 2 nights before surgery and the night before surgery.  If you shower the day of surgery use CHG.  Use special wash - you have one bottle of CHG for all showers.  You should use approximately 1/3 of the bottle for each shower.   Please read over the following fact sheets that you were given: Pain Booklet, Coughing and Deep Breathing, Blood Transfusion Information, Total Joint Packet, MRSA Information and Surgical Site Infection Prevention

## 2012-07-26 DIAGNOSIS — E7800 Pure hypercholesterolemia, unspecified: Secondary | ICD-10-CM | POA: Diagnosis not present

## 2012-07-26 DIAGNOSIS — K219 Gastro-esophageal reflux disease without esophagitis: Secondary | ICD-10-CM | POA: Diagnosis not present

## 2012-07-26 DIAGNOSIS — I10 Essential (primary) hypertension: Secondary | ICD-10-CM | POA: Diagnosis not present

## 2012-07-26 DIAGNOSIS — I2510 Atherosclerotic heart disease of native coronary artery without angina pectoris: Secondary | ICD-10-CM | POA: Diagnosis not present

## 2012-07-26 NOTE — Consult Note (Addendum)
Anesthesia chart review: Patient is an 79 year old female scheduled for right THA by Dr. Turner Daniels on 08/08/12.  History includes non-smoker, dysrhythmia (not specified), HTN, HLD, anxiety, GERD, remote history of anemia, OA, migraines, neck surgery for cyst removal '02.  BMI 31.  She had only mild CAD by cath in 2012 (10-15% LAD).  PAT RN notes indicate patient is seeing Dr. Sharyn Lull on 07/26/12, records requested to be faxed when available. .   EKG on 07/25/12 showed NSR, LAFB, minimal voltage criteria for LVH.  Cardiac cath on 10/09/10 showed: LV showed good LV systolic function, EF of 55-60%. Left main was patent. LAD has 10-15% proximal and mid stenosis. Diagonal 1 is  small which is patent. Diagonal 2 is very very small. Ramus is long which is patent. Left circumflex is small which is patent. OM1 is small which is patent. RCA is patent. PDA and PLV branches were also patent. (Performed by Dr. Sharyn Lull due to complaints of chest pain.)  CXR on 07/25/12 showed no acute cardiopulmonary abnormality.  Labs from 07/25/12 noted.  Orders were not available at her PAT visit, so any additional labs ordered will have to be done on the day of surgery.  I'll follow-up cardiology records when available.  Shonna Chock, PA-C 07/26/12 1600  Addendum: 07/27/12 1100 Received a copy of Dr. Annitta Jersey office note from yesterday.  He felt patient was "acceptable risk for surgery."

## 2012-08-06 NOTE — H&P (Signed)
Paula Pollard is an 79 y.o. female.   Chief Complaint: Right Hip Pain HPI: Patient Paula Pollard who is here to discuss the advanced arthritis she has in her right hip and both knees.  Her worse pain is in her hip along the groin region traveling down the leg.  Previous x-rays of shown her to be bone-on-bone and she is here to discuss hip replacement and possible knee replacement in the future.  X-rays of her knees and shown windswept deformity, valgus on the right varus on the left.  She's been treated with exercise, anti-inflammatory medicines and most recently got Percocet, which really did not touch the right hip pain at all.  Pain wakes her up at night, makes her feel a little unstable when she is walking and at age 4.  This is certainly a concern.  Past Medical History  Diagnosis Date  . Dysrhythmia   . Anxiety   . Bronchitis     hx of  . GERD (gastroesophageal reflux disease)   . Headache     hx of migraines  . Neuromuscular disorder     hx of carpal tunnel "never had surgery for"  . Arthritis   . Anemia     "in past as a young girl"  . Cataract     bilaterally, "no surgery at this time"  . Hyperlipidemia   . Hypertension     sees Paula Pollard    Past Surgical History  Procedure Laterality Date  . Cardiac catheterization    . Doppler echocardiography      hx of  . Neck surgery      2002, cyst removed,   . Tubal ligation      1970    No family history on file. Social History:  reports that she has never smoked. She does not have any smokeless tobacco history on file. She reports that she does not drink alcohol or use illicit drugs.  Allergies:  Allergies  Allergen Reactions  . Codeine Nausea Only and Other (See Comments)    Reaction:Dizziness and hallucinations "makes me climb walls"    No prescriptions prior to admission    No results found for this or any previous visit (from the past 48 hour(s)). No results found.  Review of Systems  Constitutional:  Negative.   Eyes: Negative.   Respiratory: Negative.   Cardiovascular: Negative.   Gastrointestinal: Negative.   Genitourinary: Negative.   Musculoskeletal: Positive for joint pain and falls.  Skin: Negative.   Neurological: Positive for headaches.  Endo/Heme/Allergies: Negative.   Psychiatric/Behavioral: Negative.     There were no vitals taken for this visit. Physical Exam  Constitutional: She is oriented to person, place, and time. She appears well-developed and well-nourished.  HENT:  Head: Normocephalic.  Eyes: Pupils are equal, round, and reactive to light.  Cardiovascular: Normal heart sounds.   Respiratory: Breath sounds normal.  GI: Bowel sounds are normal.  Musculoskeletal:       Right hip: She exhibits decreased range of motion and tenderness.  Neurological: She is alert and oriented to person, place, and time.     Assessment/Plan Assess: End-stage arthritis of right hip, right knee and left knee  Plan:.  Options were discussed at length with Paula Pollard we'll get her set up for right total up arthroplasty using DePuy hybrid design with a Summit basic stem, cemented, Pinnacle cup polyethylene liner and metal head.  I will see her back at the time of surgical or mentioned.  She is otherwise relatively healthy.  She does not have diabetes, but does have a little bit of high blood pressure.  She wants to try to go home after surgery, but admits she may have trouble getting help at the house.  If so, we'll have to her set up for a stay in rehabilitation.  Percocet 5 mg by mouth every 6-8 hours when necessary spent 60 no refills.  Paula Fruge M. 08/06/2012, 12:40 PM

## 2012-08-07 MED ORDER — CHLORHEXIDINE GLUCONATE 4 % EX LIQD: 60.0000 mL | Freq: Once | CUTANEOUS | Status: DC

## 2012-08-07 MED ORDER — CEFAZOLIN SODIUM-DEXTROSE 2-3 GM-% IV SOLR
2.0000 g | INTRAVENOUS | Status: AC
  Administered 2012-08-08: 2 g via INTRAVENOUS
  Filled 2012-08-07: qty 50

## 2012-08-08 ENCOUNTER — Inpatient Hospital Stay (HOSPITAL_COMMUNITY)
Admission: RE | Admit: 2012-08-08 | Discharge: 2012-08-11 | DRG: 470 | Disposition: A | Discharge status: Skilled Nursing Facility | Payer: Medicare Other | Source: Ambulatory Visit | Attending: Orthopedic Surgery | Admitting: Orthopedic Surgery

## 2012-08-08 ENCOUNTER — Encounter (HOSPITAL_COMMUNITY): Payer: Self-pay | Admitting: Vascular Surgery

## 2012-08-08 ENCOUNTER — Inpatient Hospital Stay (HOSPITAL_COMMUNITY): Payer: Medicare Other

## 2012-08-08 ENCOUNTER — Encounter (HOSPITAL_COMMUNITY): Payer: Self-pay | Admitting: *Deleted

## 2012-08-08 ENCOUNTER — Encounter (HOSPITAL_COMMUNITY)
Admission: RE | Disposition: A | Discharge status: Skilled Nursing Facility | Payer: Self-pay | Source: Ambulatory Visit | Attending: Orthopedic Surgery

## 2012-08-08 ENCOUNTER — Inpatient Hospital Stay (HOSPITAL_COMMUNITY): Payer: Medicare Other | Admitting: Certified Registered Nurse Anesthetist

## 2012-08-08 DIAGNOSIS — M1710 Unilateral primary osteoarthritis, unspecified knee: Secondary | ICD-10-CM | POA: Diagnosis present

## 2012-08-08 DIAGNOSIS — E785 Hyperlipidemia, unspecified: Secondary | ICD-10-CM | POA: Diagnosis present

## 2012-08-08 DIAGNOSIS — M1610 Unilateral primary osteoarthritis, unspecified hip: Secondary | ICD-10-CM | POA: Diagnosis not present

## 2012-08-08 DIAGNOSIS — M6281 Muscle weakness (generalized): Secondary | ICD-10-CM | POA: Diagnosis not present

## 2012-08-08 DIAGNOSIS — G5600 Carpal tunnel syndrome, unspecified upper limb: Secondary | ICD-10-CM | POA: Diagnosis present

## 2012-08-08 DIAGNOSIS — R42 Dizziness and giddiness: Secondary | ICD-10-CM | POA: Diagnosis not present

## 2012-08-08 DIAGNOSIS — Z7982 Long term (current) use of aspirin: Secondary | ICD-10-CM

## 2012-08-08 DIAGNOSIS — D62 Acute posthemorrhagic anemia: Secondary | ICD-10-CM | POA: Diagnosis not present

## 2012-08-08 DIAGNOSIS — M169 Osteoarthritis of hip, unspecified: Secondary | ICD-10-CM | POA: Diagnosis not present

## 2012-08-08 DIAGNOSIS — S79919A Unspecified injury of unspecified hip, initial encounter: Secondary | ICD-10-CM | POA: Diagnosis not present

## 2012-08-08 DIAGNOSIS — M25559 Pain in unspecified hip: Secondary | ICD-10-CM | POA: Diagnosis not present

## 2012-08-08 DIAGNOSIS — Z471 Aftercare following joint replacement surgery: Secondary | ICD-10-CM | POA: Diagnosis not present

## 2012-08-08 DIAGNOSIS — I10 Essential (primary) hypertension: Secondary | ICD-10-CM | POA: Diagnosis present

## 2012-08-08 DIAGNOSIS — M1990 Unspecified osteoarthritis, unspecified site: Secondary | ICD-10-CM | POA: Diagnosis not present

## 2012-08-08 DIAGNOSIS — K219 Gastro-esophageal reflux disease without esophagitis: Secondary | ICD-10-CM | POA: Diagnosis present

## 2012-08-08 DIAGNOSIS — R269 Unspecified abnormalities of gait and mobility: Secondary | ICD-10-CM | POA: Diagnosis not present

## 2012-08-08 DIAGNOSIS — M1611 Unilateral primary osteoarthritis, right hip: Secondary | ICD-10-CM | POA: Diagnosis present

## 2012-08-08 DIAGNOSIS — R279 Unspecified lack of coordination: Secondary | ICD-10-CM | POA: Diagnosis not present

## 2012-08-08 DIAGNOSIS — Z79899 Other long term (current) drug therapy: Secondary | ICD-10-CM | POA: Diagnosis not present

## 2012-08-08 DIAGNOSIS — F411 Generalized anxiety disorder: Secondary | ICD-10-CM | POA: Diagnosis present

## 2012-08-08 DIAGNOSIS — Z96649 Presence of unspecified artificial hip joint: Secondary | ICD-10-CM | POA: Diagnosis not present

## 2012-08-08 DIAGNOSIS — K2100 Gastro-esophageal reflux disease with esophagitis, without bleeding: Secondary | ICD-10-CM | POA: Diagnosis not present

## 2012-08-08 DIAGNOSIS — H269 Unspecified cataract: Secondary | ICD-10-CM | POA: Diagnosis present

## 2012-08-08 HISTORY — PX: TOTAL HIP ARTHROPLASTY: SHX124

## 2012-08-08 LAB — CBC WITH DIFFERENTIAL/PLATELET
Basophils Absolute: 0 10*3/uL (ref 0.0–0.1)
Basophils Relative: 1 % (ref 0–1)
Eosinophils Absolute: 0.1 10*3/uL (ref 0.0–0.7)
HCT: 34.7 % — ABNORMAL LOW (ref 36.0–46.0)
MCH: 29.5 pg (ref 26.0–34.0)
MCHC: 33.1 g/dL (ref 30.0–36.0)
Monocytes Absolute: 0.4 10*3/uL (ref 0.1–1.0)
Monocytes Relative: 9 % (ref 3–12)
Neutro Abs: 2.3 10*3/uL (ref 1.7–7.7)
Neutrophils Relative %: 54 % (ref 43–77)
RDW: 12.8 % (ref 11.5–15.5)

## 2012-08-08 LAB — URINALYSIS, ROUTINE W REFLEX MICROSCOPIC
Bilirubin Urine: NEGATIVE
Hgb urine dipstick: NEGATIVE
Ketones, ur: NEGATIVE mg/dL
Nitrite: NEGATIVE
Protein, ur: NEGATIVE mg/dL
Specific Gravity, Urine: 1.017 (ref 1.005–1.030)
Urobilinogen, UA: 0.2 mg/dL (ref 0.0–1.0)

## 2012-08-08 LAB — URINE MICROSCOPIC-ADD ON

## 2012-08-08 SURGERY — ARTHROPLASTY, HIP, TOTAL,POSTERIOR APPROACH
Anesthesia: General | Site: Hip | Laterality: Right | Wound class: Clean

## 2012-08-08 MED ORDER — ONDANSETRON HCL 4 MG PO TABS: 4.0000 mg | ORAL_TABLET | Freq: Four times a day (QID) | ORAL | Status: DC | PRN

## 2012-08-08 MED ORDER — PHENYLEPHRINE HCL 10 MG/ML IJ SOLN
INTRAMUSCULAR | Status: DC | PRN
  Administered 2012-08-08: 80 ug via INTRAVENOUS
  Administered 2012-08-08: 40 ug via INTRAVENOUS

## 2012-08-08 MED ORDER — OXYCODONE HCL 5 MG PO TABS
ORAL_TABLET | ORAL | Status: AC
  Filled 2012-08-08: qty 1

## 2012-08-08 MED ORDER — KCL IN DEXTROSE-NACL 20-5-0.45 MEQ/L-%-% IV SOLN
INTRAVENOUS | Status: DC
  Administered 2012-08-08 – 2012-08-09 (×2): via INTRAVENOUS
  Filled 2012-08-08 (×12): qty 1000

## 2012-08-08 MED ORDER — OXYCODONE HCL 5 MG PO TABS
5.0000 mg | ORAL_TABLET | Freq: Once | ORAL | Status: AC | PRN
  Administered 2012-08-08: 5 mg via ORAL

## 2012-08-08 MED ORDER — NEOSTIGMINE METHYLSULFATE 1 MG/ML IJ SOLN
INTRAMUSCULAR | Status: DC | PRN
  Administered 2012-08-08: 4 mg via INTRAVENOUS

## 2012-08-08 MED ORDER — ACETAMINOPHEN 650 MG RE SUPP: 650.0000 mg | Freq: Four times a day (QID) | RECTAL | Status: DC | PRN

## 2012-08-08 MED ORDER — METHOCARBAMOL 100 MG/ML IJ SOLN
500.0000 mg | INTRAVENOUS | Status: DC
  Filled 2012-08-08: qty 5

## 2012-08-08 MED ORDER — ATORVASTATIN CALCIUM 40 MG PO TABS
40.0000 mg | ORAL_TABLET | Freq: Every day | ORAL | Status: DC
  Administered 2012-08-08 – 2012-08-10 (×3): 40 mg via ORAL
  Filled 2012-08-08 (×4): qty 1

## 2012-08-08 MED ORDER — MIDAZOLAM HCL 5 MG/5ML IJ SOLN
INTRAMUSCULAR | Status: DC | PRN
  Administered 2012-08-08: 1 mg via INTRAVENOUS

## 2012-08-08 MED ORDER — MECLIZINE HCL 12.5 MG PO TABS
12.5000 mg | ORAL_TABLET | Freq: Every morning | ORAL | Status: DC
  Administered 2012-08-09 – 2012-08-11 (×3): 12.5 mg via ORAL
  Filled 2012-08-08 (×3): qty 1

## 2012-08-08 MED ORDER — PROPOFOL 10 MG/ML IV BOLUS
INTRAVENOUS | Status: DC | PRN
  Administered 2012-08-08: 100 mg via INTRAVENOUS

## 2012-08-08 MED ORDER — DEXTROSE-NACL 5-0.45 % IV SOLN: INTRAVENOUS | Status: DC

## 2012-08-08 MED ORDER — FENTANYL CITRATE 0.05 MG/ML IJ SOLN
INTRAMUSCULAR | Status: DC | PRN
  Administered 2012-08-08: 100 ug via INTRAVENOUS

## 2012-08-08 MED ORDER — LACTATED RINGERS IV SOLN
INTRAVENOUS | Status: DC | PRN
  Administered 2012-08-08 (×2): via INTRAVENOUS

## 2012-08-08 MED ORDER — METOCLOPRAMIDE HCL 5 MG/ML IJ SOLN: 5.0000 mg | Freq: Three times a day (TID) | INTRAMUSCULAR | Status: DC | PRN

## 2012-08-08 MED ORDER — PHENOL 1.4 % MT LIQD: 1.0000 | OROMUCOSAL | Status: DC | PRN

## 2012-08-08 MED ORDER — PANTOPRAZOLE SODIUM 40 MG PO TBEC
40.0000 mg | DELAYED_RELEASE_TABLET | Freq: Every day | ORAL | Status: DC
  Administered 2012-08-09 – 2012-08-11 (×3): 40 mg via ORAL
  Filled 2012-08-08 (×3): qty 1

## 2012-08-08 MED ORDER — TRIAMTERENE-HCTZ 37.5-25 MG PO TABS
1.0000 | ORAL_TABLET | Freq: Every morning | ORAL | Status: DC
  Administered 2012-08-10: 1 via ORAL
  Filled 2012-08-08 (×3): qty 1

## 2012-08-08 MED ORDER — HYDROMORPHONE HCL PF 1 MG/ML IJ SOLN
0.2500 mg | INTRAMUSCULAR | Status: DC | PRN
  Administered 2012-08-08 (×3): 0.5 mg via INTRAVENOUS

## 2012-08-08 MED ORDER — FLEET ENEMA 7-19 GM/118ML RE ENEM: 1.0000 | ENEMA | Freq: Once | RECTAL | Status: AC | PRN

## 2012-08-08 MED ORDER — MENTHOL 3 MG MT LOZG
1.0000 | LOZENGE | OROMUCOSAL | Status: DC | PRN
  Filled 2012-08-08: qty 9

## 2012-08-08 MED ORDER — DOCUSATE SODIUM 100 MG PO CAPS: 200.0000 mg | ORAL_CAPSULE | Freq: Every day | ORAL | Status: DC | PRN

## 2012-08-08 MED ORDER — ACETAMINOPHEN 10 MG/ML IV SOLN
1000.0000 mg | Freq: Four times a day (QID) | INTRAVENOUS | Status: AC
  Administered 2012-08-08 – 2012-08-09 (×4): 1000 mg via INTRAVENOUS
  Filled 2012-08-08 (×4): qty 100

## 2012-08-08 MED ORDER — GLYCOPYRROLATE 0.2 MG/ML IJ SOLN
INTRAMUSCULAR | Status: DC | PRN
  Administered 2012-08-08: 0.6 mg via INTRAVENOUS

## 2012-08-08 MED ORDER — ROCURONIUM BROMIDE 100 MG/10ML IV SOLN
INTRAVENOUS | Status: DC | PRN
  Administered 2012-08-08: 50 mg via INTRAVENOUS

## 2012-08-08 MED ORDER — LIDOCAINE HCL (CARDIAC) 20 MG/ML IV SOLN
INTRAVENOUS | Status: DC | PRN
  Administered 2012-08-08: 40 mg via INTRAVENOUS

## 2012-08-08 MED ORDER — BISACODYL 5 MG PO TBEC: 5.0000 mg | DELAYED_RELEASE_TABLET | Freq: Every day | ORAL | Status: DC | PRN

## 2012-08-08 MED ORDER — METOCLOPRAMIDE HCL 10 MG PO TABS: 5.0000 mg | ORAL_TABLET | Freq: Three times a day (TID) | ORAL | Status: DC | PRN

## 2012-08-08 MED ORDER — BUPIVACAINE-EPINEPHRINE 0.5% -1:200000 IJ SOLN
INTRAMUSCULAR | Status: DC | PRN
  Administered 2012-08-08: 19 mL

## 2012-08-08 MED ORDER — LORAZEPAM 0.5 MG PO TABS
0.5000 mg | ORAL_TABLET | Freq: Three times a day (TID) | ORAL | Status: DC | PRN
  Administered 2012-08-09 – 2012-08-11 (×3): 0.5 mg via ORAL
  Filled 2012-08-08 (×3): qty 1

## 2012-08-08 MED ORDER — OXYCODONE HCL 5 MG/5ML PO SOLN: 5.0000 mg | Freq: Once | ORAL | Status: AC | PRN

## 2012-08-08 MED ORDER — BUPIVACAINE-EPINEPHRINE PF 0.5-1:200000 % IJ SOLN
INTRAMUSCULAR | Status: AC
  Filled 2012-08-08: qty 30

## 2012-08-08 MED ORDER — METHOCARBAMOL 500 MG PO TABS
500.0000 mg | ORAL_TABLET | Freq: Four times a day (QID) | ORAL | Status: DC | PRN
  Administered 2012-08-08 – 2012-08-11 (×6): 500 mg via ORAL
  Filled 2012-08-08 (×5): qty 1

## 2012-08-08 MED ORDER — ASPIRIN EC 325 MG PO TBEC
325.0000 mg | DELAYED_RELEASE_TABLET | Freq: Two times a day (BID) | ORAL | Status: DC
  Administered 2012-08-08 – 2012-08-11 (×6): 325 mg via ORAL
  Filled 2012-08-08 (×7): qty 1

## 2012-08-08 MED ORDER — HYDROMORPHONE HCL PF 1 MG/ML IJ SOLN
INTRAMUSCULAR | Status: AC
  Filled 2012-08-08: qty 1

## 2012-08-08 MED ORDER — LOSARTAN POTASSIUM 50 MG PO TABS
50.0000 mg | ORAL_TABLET | Freq: Every morning | ORAL | Status: DC
  Administered 2012-08-10: 50 mg via ORAL
  Filled 2012-08-08 (×3): qty 1

## 2012-08-08 MED ORDER — HYDROMORPHONE HCL PF 1 MG/ML IJ SOLN
1.0000 mg | INTRAMUSCULAR | Status: DC | PRN
  Administered 2012-08-10: 0.5 mg via INTRAVENOUS
  Filled 2012-08-08: qty 1

## 2012-08-08 MED ORDER — SODIUM CHLORIDE 0.9 % IR SOLN
Status: DC | PRN
  Administered 2012-08-08: 1000 mL

## 2012-08-08 MED ORDER — METHOCARBAMOL 100 MG/ML IJ SOLN
500.0000 mg | Freq: Four times a day (QID) | INTRAVENOUS | Status: DC | PRN
  Filled 2012-08-08: qty 5

## 2012-08-08 MED ORDER — OXYCODONE HCL 5 MG PO TABS
5.0000 mg | ORAL_TABLET | ORAL | Status: DC | PRN
  Administered 2012-08-08 – 2012-08-10 (×7): 10 mg via ORAL
  Administered 2012-08-10: 5 mg via ORAL
  Administered 2012-08-10 – 2012-08-11 (×5): 10 mg via ORAL
  Filled 2012-08-08 (×13): qty 2

## 2012-08-08 MED ORDER — ALUM & MAG HYDROXIDE-SIMETH 200-200-20 MG/5ML PO SUSP: 30.0000 mL | ORAL | Status: DC | PRN

## 2012-08-08 MED ORDER — ACETAMINOPHEN 10 MG/ML IV SOLN
1000.0000 mg | Freq: Once | INTRAVENOUS | Status: AC
  Administered 2012-08-08: 1000 mg via INTRAVENOUS

## 2012-08-08 MED ORDER — ONDANSETRON HCL 4 MG/2ML IJ SOLN
4.0000 mg | Freq: Four times a day (QID) | INTRAMUSCULAR | Status: DC | PRN
  Administered 2012-08-09: 4 mg via INTRAVENOUS
  Filled 2012-08-08: qty 2

## 2012-08-08 MED ORDER — EPHEDRINE SULFATE 50 MG/ML IJ SOLN
INTRAMUSCULAR | Status: DC | PRN
  Administered 2012-08-08: 5 mg via INTRAVENOUS
  Administered 2012-08-08: 15 mg via INTRAVENOUS
  Administered 2012-08-08 (×3): 10 mg via INTRAVENOUS

## 2012-08-08 MED ORDER — DIPHENHYDRAMINE HCL 12.5 MG/5ML PO ELIX: 12.5000 mg | ORAL_SOLUTION | ORAL | Status: DC | PRN

## 2012-08-08 MED ORDER — ONDANSETRON HCL 4 MG/2ML IJ SOLN
INTRAMUSCULAR | Status: DC | PRN
  Administered 2012-08-08: 4 mg via INTRAVENOUS

## 2012-08-08 MED ORDER — CELECOXIB 200 MG PO CAPS
200.0000 mg | ORAL_CAPSULE | Freq: Two times a day (BID) | ORAL | Status: DC
  Administered 2012-08-08 – 2012-08-11 (×7): 200 mg via ORAL
  Filled 2012-08-08 (×8): qty 1

## 2012-08-08 MED ORDER — CELECOXIB 200 MG PO CAPS
ORAL_CAPSULE | ORAL | Status: AC
  Filled 2012-08-08: qty 1

## 2012-08-08 MED ORDER — ACETAMINOPHEN 10 MG/ML IV SOLN
INTRAVENOUS | Status: AC
  Filled 2012-08-08: qty 100

## 2012-08-08 MED ORDER — MAGNESIUM HYDROXIDE 400 MG/5ML PO SUSP: 30.0000 mL | Freq: Every day | ORAL | Status: DC | PRN

## 2012-08-08 MED ORDER — METHOCARBAMOL 500 MG PO TABS
ORAL_TABLET | ORAL | Status: AC
  Filled 2012-08-08: qty 1

## 2012-08-08 MED ORDER — NEBIVOLOL HCL 5 MG PO TABS
5.0000 mg | ORAL_TABLET | Freq: Every day | ORAL | Status: DC
  Administered 2012-08-10: 5 mg via ORAL
  Filled 2012-08-08 (×3): qty 1

## 2012-08-08 MED ORDER — ACETAMINOPHEN 325 MG PO TABS: 650.0000 mg | ORAL_TABLET | Freq: Four times a day (QID) | ORAL | Status: DC | PRN

## 2012-08-08 SURGICAL SUPPLY — 53 items
BLADE SAW SAG 73X25 THK (BLADE) ×1
BLADE SAW SGTL 18X1.27X75 (BLADE) IMPLANT
BLADE SAW SGTL 73X25 THK (BLADE) ×1 IMPLANT
BLADE SAW SGTL MED 73X18.5 STR (BLADE) IMPLANT
BRUSH FEMORAL CANAL (MISCELLANEOUS) IMPLANT
CLOTH BEACON ORANGE TIMEOUT ST (SAFETY) ×2 IMPLANT
COVER BACK TABLE 24X17X13 BIG (DRAPES) IMPLANT
COVER SURGICAL LIGHT HANDLE (MISCELLANEOUS) ×4 IMPLANT
DRAPE ORTHO SPLIT 77X108 STRL (DRAPES) ×2
DRAPE PROXIMA HALF (DRAPES) ×2 IMPLANT
DRAPE SURG ORHT 6 SPLT 77X108 (DRAPES) ×1 IMPLANT
DRAPE U-SHAPE 47X51 STRL (DRAPES) ×2 IMPLANT
DRILL BIT 7/64X5 (BIT) ×2 IMPLANT
DRSG MEPILEX BORDER 4X12 (GAUZE/BANDAGES/DRESSINGS) ×2 IMPLANT
DRSG MEPILEX BORDER 4X8 (GAUZE/BANDAGES/DRESSINGS) ×2 IMPLANT
DURAPREP 26ML APPLICATOR (WOUND CARE) ×2 IMPLANT
ELECT BLADE 4.0 EZ CLEAN MEGAD (MISCELLANEOUS) ×2
ELECT REM PT RETURN 9FT ADLT (ELECTROSURGICAL) ×2
ELECTRODE BLDE 4.0 EZ CLN MEGD (MISCELLANEOUS) ×1 IMPLANT
ELECTRODE REM PT RTRN 9FT ADLT (ELECTROSURGICAL) ×1 IMPLANT
GAUZE XEROFORM 1X8 LF (GAUZE/BANDAGES/DRESSINGS) ×2 IMPLANT
GLOVE BIO SURGEON STRL SZ7 (GLOVE) ×2 IMPLANT
GLOVE BIO SURGEON STRL SZ7.5 (GLOVE) ×2 IMPLANT
GLOVE BIOGEL PI IND STRL 7.0 (GLOVE) ×1 IMPLANT
GLOVE BIOGEL PI IND STRL 8 (GLOVE) ×1 IMPLANT
GLOVE BIOGEL PI INDICATOR 7.0 (GLOVE) ×1
GLOVE BIOGEL PI INDICATOR 8 (GLOVE) ×1
GOWN PREVENTION PLUS XLARGE (GOWN DISPOSABLE) ×2 IMPLANT
GOWN STRL NON-REIN LRG LVL3 (GOWN DISPOSABLE) ×4 IMPLANT
HANDPIECE INTERPULSE COAX TIP (DISPOSABLE)
HOOD PEEL AWAY FACE SHEILD DIS (HOOD) ×4 IMPLANT
KIT BASIN OR (CUSTOM PROCEDURE TRAY) ×2 IMPLANT
KIT ROOM TURNOVER OR (KITS) ×2 IMPLANT
MANIFOLD NEPTUNE II (INSTRUMENTS) ×2 IMPLANT
NEEDLE 22X1 1/2 (OR ONLY) (NEEDLE) ×2 IMPLANT
NS IRRIG 1000ML POUR BTL (IV SOLUTION) ×2 IMPLANT
PACK TOTAL JOINT (CUSTOM PROCEDURE TRAY) ×2 IMPLANT
PAD ARMBOARD 7.5X6 YLW CONV (MISCELLANEOUS) ×4 IMPLANT
PASSER SUT SWANSON 36MM LOOP (INSTRUMENTS) ×2 IMPLANT
PRESSURIZER FEMORAL UNIV (MISCELLANEOUS) IMPLANT
SET HNDPC FAN SPRY TIP SCT (DISPOSABLE) IMPLANT
SUT ETHIBOND 2 V 37 (SUTURE) ×2 IMPLANT
SUT ETHILON 3 0 FSL (SUTURE) ×2 IMPLANT
SUT VIC AB 0 CTB1 27 (SUTURE) ×2 IMPLANT
SUT VIC AB 1 CTX 36 (SUTURE) ×1
SUT VIC AB 1 CTX36XBRD ANBCTR (SUTURE) ×1 IMPLANT
SUT VIC AB 2-0 CTB1 (SUTURE) ×2 IMPLANT
SYR CONTROL 10ML LL (SYRINGE) ×2 IMPLANT
TOWEL OR 17X24 6PK STRL BLUE (TOWEL DISPOSABLE) ×2 IMPLANT
TOWEL OR 17X26 10 PK STRL BLUE (TOWEL DISPOSABLE) ×2 IMPLANT
TOWER CARTRIDGE SMART MIX (DISPOSABLE) IMPLANT
TRAY FOLEY CATH 14FR (SET/KITS/TRAYS/PACK) ×2 IMPLANT
WATER STERILE IRR 1000ML POUR (IV SOLUTION) ×2 IMPLANT

## 2012-08-08 NOTE — Anesthesia Postprocedure Evaluation (Signed)
  Anesthesia Post-op Note  Patient: Paula Pollard  Procedure(s) Performed: Procedure(s): TOTAL HIP ARTHROPLASTY (Right)  Patient Location: PACU  Anesthesia Type:General  Level of Consciousness: awake  Airway and Oxygen Therapy: Patient Spontanous Breathing and Patient connected to nasal cannula oxygen  Post-op Pain: mild  Post-op Assessment: Post-op Vital signs reviewed, Patient's Cardiovascular Status Stable, Respiratory Function Stable, Patent Airway and No signs of Nausea or vomiting  Post-op Vital Signs: Reviewed and stable  Complications: No apparent anesthesia complications

## 2012-08-08 NOTE — Transfer of Care (Signed)
Immediate Anesthesia Transfer of Care Note  Patient: Paula Pollard  Procedure(s) Performed: Procedure(s): TOTAL HIP ARTHROPLASTY (Right)  Patient Location: PACU  Anesthesia Type:General  Level of Consciousness: awake, alert , oriented and patient cooperative  Airway & Oxygen Therapy: Patient Spontanous Breathing and Patient connected to nasal cannula oxygen  Post-op Assessment: Report given to PACU RN, Post -op Vital signs reviewed and stable and Patient moving all extremities X 4  Post vital signs: Reviewed and stable  Complications: No apparent anesthesia complications

## 2012-08-08 NOTE — Preoperative (Signed)
Beta Blockers   Reason not to administer Beta Blockers:Not Applicable 

## 2012-08-08 NOTE — Anesthesia Preprocedure Evaluation (Addendum)
Anesthesia Evaluation  Patient identified by MRN, date of birth, ID band Patient awake    Reviewed: Allergy & Precautions, H&P , NPO status , Patient's Chart, lab work & pertinent test results, reviewed documented beta blocker date and time   Airway Mallampati: II TM Distance: >3 FB Neck ROM: Full    Dental no notable dental hx. (+) Teeth Intact, Edentulous Lower and Edentulous Upper   Pulmonary neg pulmonary ROS,  breath sounds clear to auscultation  Pulmonary exam normal       Cardiovascular hypertension, On Home Beta Blockers and On Medications Rhythm:Regular Rate:Normal     Neuro/Psych Anxiety negative neurological ROS  negative psych ROS   GI/Hepatic Neg liver ROS, GERD-  Medicated and Controlled,  Endo/Other  negative endocrine ROS  Renal/GU negative Renal ROS  negative genitourinary   Musculoskeletal  (+) Arthritis -, Osteoarthritis,    Abdominal   Peds  Hematology negative hematology ROS (+)   Anesthesia Other Findings   Reproductive/Obstetrics negative OB ROS                          Anesthesia Physical Anesthesia Plan  ASA: II  Anesthesia Plan: General   Post-op Pain Management:    Induction: Intravenous  Airway Management Planned: Oral ETT  Additional Equipment:   Intra-op Plan:   Post-operative Plan: Extubation in OR  Informed Consent: I have reviewed the patients History and Physical, chart, labs and discussed the procedure including the risks, benefits and alternatives for the proposed anesthesia with the patient or authorized representative who has indicated his/her understanding and acceptance.   Dental advisory given  Plan Discussed with: CRNA and Anesthesiologist  Anesthesia Plan Comments:        Anesthesia Quick Evaluation

## 2012-08-08 NOTE — Interval H&P Note (Signed)
History and Physical Interval Note:  08/08/2012 9:37 AM  Paula Pollard  has presented today for surgery, with the diagnosis of RIGHT HIP OSTEOARTHRITIS   The various methods of treatment have been discussed with the patient and family. After consideration of risks, benefits and other options for treatment, the patient has consented to  Procedure(s): TOTAL HIP ARTHROPLASTY (Right) as a surgical intervention .  The patient's history has been reviewed, patient examined, no change in status, stable for surgery.  I have reviewed the patient's chart and labs.  Questions were answered to the patient's satisfaction.     Nestor Lewandowsky
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
## 2012-08-08 NOTE — Plan of Care (Signed)
Problem: Consults Goal: Diagnosis- Total Joint Replacement Primary Total Hip RIGHT

## 2012-08-08 NOTE — Progress Notes (Signed)
0910   Pt had to have UA ( which we are attempting to get at this moment) and additional  CBC with Diff (last cbc without diff drawn 2/17) .Marland Kitchen Had new order for differential.   DA

## 2012-08-08 NOTE — Progress Notes (Signed)
UR COMPLETED  

## 2012-08-08 NOTE — Evaluation (Signed)
Physical Therapy Evaluation Patient Details Name: Paula Pollard MRN: [id number] DOB: 1930-01-09 Today's Date: 08/08/2012 Time: 7829-5621 PT Time Calculation (min): 28 min  PT Assessment / Plan / Recommendation Clinical Impression  Patient is an 79 yo female s/p Rt. THA.  Will benefit from acute PT to maximize independence prior to discharge.  Patient lives alone.  Recommend ST-SNF for continued therapy at discharge.    PT Assessment  Patient needs continued PT services    Follow Up Recommendations  SNF    Does the patient have the potential to tolerate intense rehabilitation      Barriers to Discharge Decreased caregiver support Lives alone    Equipment Recommendations  None recommended by PT    Recommendations for Other Services     Frequency 7X/week    Precautions / Restrictions Precautions Precautions: Posterior Hip Precaution Booklet Issued: Yes (comment) Precaution Comments: Reviewed precautions with patient. Restrictions Weight Bearing Restrictions: Yes RLE Weight Bearing: Weight bearing as tolerated LLE Weight Bearing: Weight bearing as tolerated   Pertinent Vitals/Pain       Mobility  Bed Mobility Bed Mobility: Supine to Sit;Sitting - Scoot to Edge of Bed;Sit to Supine Supine to Sit: 3: Mod assist;With rails;HOB elevated Sitting - Scoot to Edge of Bed: 4: Min assist;With rail Sit to Supine: 3: Mod assist;HOB elevated Details for Bed Mobility Assistance: Verbal cues for technique to maintain hip precautions.  Assist to move RLE off of bed, and steady trunk.  Patient able to sit at EOB x 10 minutes with good balance.  Returned to supine with assist to bring LE's onto bed. Transfers Transfers: Not assessed    Exercises     PT Diagnosis: Difficulty walking;Acute pain  PT Problem List: Decreased strength;Decreased range of motion;Decreased activity tolerance;Decreased balance;Decreased mobility;Decreased knowledge of use of DME;Decreased knowledge of  precautions;Pain PT Treatment Interventions: DME instruction;Gait training;Functional mobility training;Therapeutic exercise;Patient/family education   PT Goals Acute Rehab PT Goals PT Goal Formulation: With patient Time For Goal Achievement: 08/15/12 Potential to Achieve Goals: Good Pt will go Supine/Side to Sit: with supervision;with HOB 0 degrees PT Goal: Supine/Side to Sit - Progress: Goal set today Pt will go Sit to Supine/Side: with supervision;with HOB 0 degrees PT Goal: Sit to Supine/Side - Progress: Goal set today Pt will go Sit to Stand: with min assist;with upper extremity assist PT Goal: Sit to Stand - Progress: Goal set today Pt will go Stand to Sit: with supervision;with upper extremity assist PT Goal: Stand to Sit - Progress: Goal set today Pt will Ambulate: 51 - 150 feet;with supervision;with rolling walker PT Goal: Ambulate - Progress: Goal set today Pt will Perform Home Exercise Program: with supervision, verbal cues required/provided PT Goal: Perform Home Exercise Program - Progress: Goal set today  Visit Information  Last PT Received On: 08/08/12 Assistance Needed: +2    Subjective Data  Subjective: "I live by myself, so I'm going to rehab for a while" Patient Stated Goal: To walk   Prior Functioning  Home Living Lives With: Alone Available Help at Discharge: Skilled Nursing Facility Home Adaptive Equipment: Walker - rolling;Bedside commode/3-in-1 Prior Function Level of Independence: Independent Able to Take Stairs?: Yes Driving: Yes Vocation: Retired Musician: No difficulties    Copywriter, advertising Overall Cognitive Status: Appears within functional limits for tasks assessed/performed Arousal/Alertness: Awake/alert Orientation Level: Appears intact for tasks assessed Behavior During Session: [facility] for tasks performed    Extremity/Trunk Assessment Right Upper Extremity Assessment RUE ROM/Strength/Tone: [facility] for tasks  assessed  RUE Sensation: WFL - Light Touch Left Upper Extremity Assessment LUE ROM/Strength/Tone: WFL for tasks assessed LUE Sensation: WFL - Light Touch Right Lower Extremity Assessment RLE ROM/Strength/Tone: Deficits;Unable to fully assess;Due to pain;Due to precautions RLE ROM/Strength/Tone Deficits: Able to assist with moving RLE off of bed.  Noted knee deformity Left Lower Extremity Assessment LLE ROM/Strength/Tone: WFL for tasks assessed (Noted knee deformity) LLE Sensation: WFL - Light Touch   Balance Balance Balance Assessed: Yes Static Sitting Balance Static Sitting - Balance Support: Right upper extremity supported;Feet supported Static Sitting - Level of Assistance: 5: Stand by assistance Static Sitting - Comment/# of Minutes: 10  End of Session PT - End of Session Equipment Utilized During Treatment: Oxygen Activity Tolerance: Patient limited by fatigue Patient left: in bed;with call bell/phone within reach;with family/visitor present;with nursing in room Nurse Communication: Mobility status  GP     Vena Austria 08/08/2012, 5:40 PM Durenda Hurt. Renaldo Fiddler, [facility] Acute Rehab Services Pager [phone]

## 2012-08-08 NOTE — Anesthesia Procedure Notes (Addendum)
Procedure Name: Intubation Date/Time: 08/08/2012 10:02 AM Performed by: Sherie Don Pre-anesthesia Checklist: Patient identified, Emergency Drugs available, Suction available, Patient being monitored and Timeout performed Patient Re-evaluated:Patient Re-evaluated prior to inductionOxygen Delivery Method: Circle system utilized Preoxygenation: Pre-oxygenation with 100% oxygen Intubation Type: IV induction Ventilation: Mask ventilation without difficulty Laryngoscope Size: Miller and 2 Grade View: Grade I Tube size: 7.5 mm Number of attempts: 1 Airway Equipment and Method: Stylet Placement Confirmation: ETT inserted through vocal cords under direct vision,  positive ETCO2 and breath sounds checked- equal and bilateral Secured at: 23 cm Tube secured with: Tape Dental Injury: Teeth and Oropharynx as per pre-operative assessment     Performed by: Rogelia Boga

## 2012-08-09 LAB — BASIC METABOLIC PANEL
CO2: 30 mEq/L (ref 19–32)
Calcium: 7.9 mg/dL — ABNORMAL LOW (ref 8.4–10.5)
Chloride: 103 mEq/L (ref 96–112)
Glucose, Bld: 116 mg/dL — ABNORMAL HIGH (ref 70–99)
Sodium: 137 mEq/L (ref 135–145)

## 2012-08-09 LAB — CBC
Hemoglobin: 8.5 g/dL — ABNORMAL LOW (ref 12.0–15.0)
MCH: 29.1 pg (ref 26.0–34.0)
MCV: 89 fL (ref 78.0–100.0)
Platelets: 154 10*3/uL (ref 150–400)
RBC: 2.92 MIL/uL — ABNORMAL LOW (ref 3.87–5.11)
WBC: 4.2 10*3/uL (ref 4.0–10.5)

## 2012-08-09 NOTE — Clinical Social Work Psychosocial (Signed)
Clinical Social Work Department BRIEF PSYCHOSOCIAL ASSESSMENT 08/09/2012  Patient:  Paula Pollard, Paula Pollard     Account Number:  [id number]     Admit date:  08/08/2012  Clinical Social Worker:  Johnsie Cancel  Date/Time:  08/09/2012 04:27 PM  Referred by:  RN  Date Referred:  08/08/2012 Referred for  SNF Placement   Other Referral:   Interview type:  Patient Other interview type:    PSYCHOSOCIAL DATA Living Status:  ALONE Primary support name:  Rinaldo Cloud [phone]) Primary support relationship to patient:  CHILD, ADULT Degree of support available:   Adequate, at patient's bedside.    CURRENT CONCERNS Current Concerns  Post-Acute Placement   Other Concerns:    SOCIAL WORK ASSESSMENT / PLAN CSW consulted re: SNF placement. CSW met with patient at bedside. CSW introduced self, explained role of CSW and provided support on current hospital stay. Patient stated she pre-registered and toured [facility] prior to surgery. CSW faxed [facility] the patient's FL2 to confirm d/c plan. CSW will continue to follow patient for d/c.   Assessment/plan status:  Information/Referral to [facility] Other assessment/ plan:   Information/referral to community resources:    PATIENT'S/FAMILY'S RESPONSE TO PLAN OF CARE: Patient thanked CSW for assisting in d/c planning.   Lia Foyer, LCSWA [facility] Clinical Social Worker Contact #: [phone]

## 2012-08-09 NOTE — Progress Notes (Signed)
CARE MANAGEMENT NOTE 08/09/2012  Patient:  Paula Pollard, Paula Pollard   Account Number:  [id number]  Date Initiated:  08/09/2012  Documentation initiated by:  Vance Peper  Subjective/Objective Assessment:   79 yr old female s/p right total hip arthroplasty.     Action/Plan:   Patient is for shortterm rehab at [facility]. Social worker is aware.   Anticipated DC Date:  08/11/2012   Anticipated DC Plan:  SKILLED NURSING FACILITY  In-house referral  Clinical Social Worker      DC Planning Services  CM consult      Choice offered to / List presented to:             Status of service:  Completed, signed off Medicare Important Message given?   (If response is "NO", the following Medicare IM given date fields will be blank) Date Medicare IM given:   Date Additional Medicare IM given:    Discharge Disposition:  SKILLED NURSING FACILITY  Per UR Regulation:    If discussed at Long Length of Stay Meetings, dates discussed:    Comments:

## 2012-08-09 NOTE — Progress Notes (Signed)
Patient ID: Paula Pollard, female   DOB: June 02, 1930, 79 y.o.   MRN: [id number] PATIENT ID: Paula Pollard  MRN: [id number]  DOB/AGE:  29-Dec-1929 / 79 y.o.  1 Day Post-Op Procedure(s) (LRB): TOTAL HIP ARTHROPLASTY (Right)    PROGRESS NOTE Subjective: Patient is alert, oriented,no Nausea, no Vomiting, yes passing gas, no Bowel Movement. Taking PO well. Denies SOB, Chest or Calf Pain. Using Incentive Spirometer, PAS in place. Ambulate WBAT Patient reports pain as 1 on 0-10 scale  .    Objective: Vital signs in last 24 hours: Filed Vitals:   08/08/12 2154 08/08/12 2338 08/09/12 0228 08/09/12 0554  BP: 107/45  109/46 97/42  Pulse: 77  84 73  Temp: 97.6 F (36.4 C)  98.2 F (36.8 C) 97.9 F (36.6 C)  TempSrc: Oral     Resp: 18 18 18 18   SpO2: 100% 100% 100% 100%      Intake/Output from previous day: I/O last 3 completed shifts: In: 2360 [P.O.:960; I.V.:1400] Out: 3025 [Urine:2675; Blood:350]   Intake/Output this shift: Total I/O In: -  Out: 250 [Urine:250]   LABORATORY DATA:  Recent Labs  08/08/12 0905 08/09/12 0610  WBC 4.2 4.2  HGB 11.5* 8.5*  HCT 34.7* 26.0*  PLT 195 154  NA  --  137  K  --  3.9  CL  --  103  CO2  --  30  BUN  --  8  CREATININE  --  0.77  GLUCOSE  --  116*  CALCIUM  --  7.9*    Examination: Neurologically intact ABD soft Neurovascular intact Sensation intact distally Intact pulses distally Dorsiflexion/Plantar flexion intact Incision: scant drainage No cellulitis present Compartment soft} XR AP&Lat of hip shows well placed\fixed THA  Assessment:   1 Day Post-Op Procedure(s) (LRB): TOTAL HIP ARTHROPLASTY (Right) ADDITIONAL DIAGNOSIS:  Hypertension  Plan: PT/OT WBAT, THA  posterior precautions  DVT Prophylaxis: SCDx72 hrs, ASA 325 mg BID x 2 weeks  DISCHARGE PLAN: Skilled Nursing Facility/Rehab, [facility]  DISCHARGE NEEDS: HHPT, HHRN, CPM, Walker and 3-in-1 comode seat

## 2012-08-09 NOTE — Progress Notes (Signed)
Physical Therapy Treatment Patient Details Name: Paula Pollard MRN: [id number] DOB: Oct 22, 1929 Today's Date: 08/09/2012 Time: 7829-5621 PT Time Calculation (min): 23 min  PT Assessment / Plan / Recommendation Comments on Treatment Session  Patient s/p R THA wqith posterior precautions. Patient progressing well with the ability to ambulate this morning. Patient lives alone and awaiting SNF to increase functional independence prior to returning home    Follow Up Recommendations  SNF     Does the patient have the potential to tolerate intense rehabilitation     Barriers to Discharge        Equipment Recommendations  None recommended by PT    Recommendations for Other Services    Frequency 7X/week   Plan Discharge plan remains appropriate;Frequency remains appropriate    Precautions / Restrictions Precautions Precautions: Posterior Hip Precaution Booklet Issued: Yes (comment) Restrictions RLE Weight Bearing: Weight bearing as tolerated   Pertinent Vitals/Pain     Mobility  Bed Mobility Bed Mobility: Not assessed Transfers Transfers: Stand to Sit Sit to Stand: 4: Min assist;With upper extremity assist;From chair/3-in-1 Stand to Sit: 4: Min assist;With upper extremity assist;To chair/3-in-1 Details for Transfer Assistance: Cues for technique and safe hand placement and R LE positioning. A to ensure balance with stand and to control descent back onto recliner Ambulation/Gait Ambulation/Gait Assistance: 4: Min assist Ambulation Distance (Feet): 120 Feet Assistive device: Rolling walker Ambulation/Gait Assistance Details: A to ensure stability. Cues for gait sequence and RW management. Cues to increase heel strike and to bend knee with swing through phase Gait Pattern: Step-to pattern;Decreased step length - left;Decreased step length - right;Left foot flat;Right foot flat;Trunk flexed    Exercises Total Joint Exercises Ankle Circles/Pumps: AROM;Right;10 reps Quad Sets:  AROM;10 reps;Right Gluteal Sets: AROM;Both;10 reps Heel Slides: AAROM;Right;10 reps Hip ABduction/ADduction: AROM;Right;10 reps   PT Diagnosis:    PT Problem List:   PT Treatment Interventions:     PT Goals Acute Rehab PT Goals PT Goal: Sit to Stand - Progress: Progressing toward goal PT Goal: Stand to Sit - Progress: Progressing toward goal PT Goal: Ambulate - Progress: Progressing toward goal PT Goal: Perform Home Exercise Program - Progress: Progressing toward goal  Visit Information  Last PT Received On: 08/09/12 Assistance Needed: +1    Subjective Data      Cognition  Cognition Overall Cognitive Status: Appears within functional limits for tasks assessed/performed Arousal/Alertness: Awake/alert Orientation Level: Appears intact for tasks assessed Behavior During Session: [facility] for tasks performed    Balance     End of Session PT - End of Session Equipment Utilized During Treatment: Gait belt Activity Tolerance: Patient tolerated treatment well Patient left: in chair;with call bell/phone within reach;with family/visitor present Nurse Communication: Mobility status   GP     Fredrich Birks 08/09/2012, 11:11 AM 08/09/2012 Fredrich Birks PTA [phone] pager [phone] office

## 2012-08-09 NOTE — Clinical Social Work Placement (Addendum)
Clinical Social Work Department CLINICAL SOCIAL WORK PLACEMENT NOTE 08/09/2012  Patient:  Paula Pollard, Paula Pollard  Account Number:  [id number] Admit date:  08/08/2012  Clinical Social Worker:  Johnsie Cancel  Date/time:  08/09/2012 04:25 PM  Clinical Social Work is seeking post-discharge placement for this patient at the following level of care:   SKILLED NURSING   (*CSW will update this form in Epic as items are completed)   08/09/2012  Patient/family provided with [facility] Department of Clinical Social Work's list of facilities offering this level of care within the geographic area requested by the patient (or if unable, by the patient's family).  08/09/2012  Patient/family informed of their freedom to choose among providers that offer the needed level of care, that participate in Medicare, Medicaid or managed care program needed by the patient, have an available bed and are willing to accept the patient.  08/09/2012  Patient/family informed of [facility]' ownership interest in [facility], as well as of the fact that they are under no obligation to receive care at this facility.  PASARR submitted to EDS on 08/11/2012 PASARR number received from EDS on 08/11/2012   FL2 transmitted to all facilities in geographic area requested by pt/family on  08/09/2012 FL2 transmitted to all facilities within larger geographic area on N/A  Patient informed that his/her managed care company has contracts with or will negotiate with  certain facilities, including the following:     Patient/family informed of bed offers received:  08/09/2012 Patient chooses bed at [facility] Physician recommends and patient chooses bed at    Patient to be transferred to [facility] on  08/11/2012 Patient to be transferred to facility by [facility]  The following physician request were entered in Epic:   Additional Comments:  Lia Foyer, LCSWA [facility] Clinical Social  Worker Contact #: [phone]

## 2012-08-09 NOTE — Evaluation (Signed)
Occupational Therapy Evaluation Patient Details Name: Paula Pollard MRN: [id number] DOB: 10-23-1929 Today's Date: 08/09/2012 Time: 9528-4132 OT Time Calculation (min): 35 min  OT Assessment / Plan / Recommendation Clinical Impression  Pt demos decline in function with ADLs, strength, balance, safety and activity tolerance following R THA. Pt would benefit from OT services to address these impairments to help maximize Independence and safety    OT Assessment  Patient needs continued OT Services    Follow Up Recommendations  SNF    Barriers to Discharge Decreased caregiver support Pt lives at home alone  Equipment Recommendations  Other (comment);3 in 1 bedside comode;Tub/shower bench (TBD at SNF level of care)    Recommendations for Other Services    Frequency  Min 2X/week    Precautions / Restrictions Precautions Precautions: Posterior Hip Precaution Booklet Issued: Yes (comment) Precaution Comments: Pt able to verbalize 2/3 hip precautions, reviewed all hip precautions with pt Restrictions Weight Bearing Restrictions: Yes RLE Weight Bearing: Weight bearing as tolerated LLE Weight Bearing: Weight bearing as tolerated   Pertinent Vitals/Pain     ADL  Grooming: Performed;Wash/dry hands;Wash/dry face;Min guard Where Assessed - Grooming: Supported standing Upper Body Bathing: Supervision/safety;Simulated;Set up Lower Body Bathing: Simulated;Maximal assistance Upper Body Dressing: Performed;Supervision/safety;Set up Lower Body Dressing: Maximal assistance;Performed Toilet Transfer: Performed;Minimal assistance Toilet Transfer Method: Sit to stand;Other (comment) (ambulating with RW) Toilet Transfer Equipment: Raised toilet seat with arms (or 3-in-1 over toilet) Toileting - Clothing Manipulation and Hygiene: Performed;Minimal assistance Where Assessed - Toileting Clothing Manipulation and Hygiene: Standing Equipment Used: Long-handled shoe horn;Long-handled  sponge;Reacher;Rolling walker;Sock aid;Gait belt;Other (comment) (3 in 1) Transfers/Ambulation Related to ADLs: verbal cues required for correct hand placement and control of descent onto 3 in 1 and reciner ADL Comments: Pt provided with education and demo of ADL A/E for home use    OT Diagnosis: Generalized weakness;Acute pain  OT Problem List: Decreased strength;Decreased knowledge of use of DME or AE;Decreased knowledge of precautions;Decreased safety awareness;Impaired balance (sitting and/or standing);Pain;Decreased activity tolerance OT Treatment Interventions: Self-care/ADL training;Balance training;Therapeutic exercise;Neuromuscular education;Therapeutic activities;DME and/or AE instruction;Patient/family education   OT Goals Acute Rehab OT Goals OT Goal Formulation: With patient Time For Goal Achievement: 08/16/12 Potential to Achieve Goals: Good ADL Goals Pt Will Perform Grooming: with set-up;with supervision;Standing at sink ADL Goal: Grooming - Progress: Goal set today Pt Will Perform Lower Body Bathing: with mod assist;with min assist;with adaptive equipment ADL Goal: Lower Body Bathing - Progress: Goal set today Pt Will Perform Lower Body Dressing: with min assist;with mod assist;with adaptive equipment ADL Goal: Lower Body Dressing - Progress: Goal set today Pt Will Transfer to Toilet: with supervision;with DME;Grab bars ADL Goal: Toilet Transfer - Progress: Goal set today Pt Will Perform Toileting - Clothing Manipulation: with min assist;with supervision ADL Goal: Toileting - Clothing Manipulation - Progress: Goal set today Pt Will Perform Tub/Shower Transfer: with supervision;with min assist;with DME;Grab bars ADL Goal: Tub/Shower Transfer - Progress: Goal set today  Visit Information  Last OT Received On: 08/09/12 Assistance Needed: +1    Subjective Data  Subjective: " I am going to [facility] for rehab " Patient Stated Goal: To return home after rehab at SNF    Prior Functioning     Home Living Lives With: Alone Available Help at Discharge: Skilled Nursing Facility Home Adaptive Equipment: Walker - rolling;Bedside commode/3-in-1 Prior Function Level of Independence: Independent Able to Take Stairs?: Yes Driving: Yes Vocation: Retired Musician: No difficulties Dominant Hand: Right  Vision/Perception Vision - History Baseline Vision: Wears glasses all the time Patient Visual Report: No change from baseline Perception Perception: Within Functional Limits   Cognition  Cognition Overall Cognitive Status: Appears within functional limits for tasks assessed/performed Arousal/Alertness: Awake/alert Orientation Level: Oriented X4 / Intact Behavior During Session: [facility] for tasks performed    Extremity/Trunk Assessment Right Upper Extremity Assessment RUE ROM/Strength/Tone: [facility] for tasks assessed Left Upper Extremity Assessment LUE ROM/Strength/Tone: WFL for tasks assessed     Mobility Bed Mobility Bed Mobility: Not assessed Transfers Sit to Stand: 4: Min assist;With upper extremity assist;From chair/3-in-1 Stand to Sit: 4: Min assist;With upper extremity assist;To chair/3-in-1 Details for Transfer Assistance: Cues for technique and safe hand placement and R LE positioning. A to ensure balance with stand and to control descent back onto recliner        Balance Balance Balance Assessed: No   End of Session OT - End of Session Equipment Utilized During Treatment: Gait belt;Other (comment) (RW, 3 in 1, ADL A/E) Activity Tolerance: Patient tolerated treatment well Patient left: in chair;with call bell/phone within reach;with family/visitor present  GO     Galen Manila 08/09/2012, 1:56 PM

## 2012-08-10 ENCOUNTER — Encounter (HOSPITAL_COMMUNITY): Payer: Self-pay | Admitting: General Practice

## 2012-08-10 DIAGNOSIS — M1611 Unilateral primary osteoarthritis, right hip: Secondary | ICD-10-CM | POA: Diagnosis present

## 2012-08-10 LAB — CBC
HCT: 24.8 % — ABNORMAL LOW (ref 36.0–46.0)
MCH: 29.3 pg (ref 26.0–34.0)
MCV: 88.6 fL (ref 78.0–100.0)
Platelets: 130 10*3/uL — ABNORMAL LOW (ref 150–400)
RBC: 2.8 MIL/uL — ABNORMAL LOW (ref 3.87–5.11)
WBC: 5.2 10*3/uL (ref 4.0–10.5)

## 2012-08-10 MED ORDER — ASPIRIN 325 MG PO TBEC: 325.0000 mg | DELAYED_RELEASE_TABLET | Freq: Two times a day (BID) | ORAL | Status: DC

## 2012-08-10 MED ORDER — BISACODYL 5 MG PO TBEC: 5.0000 mg | DELAYED_RELEASE_TABLET | Freq: Every day | ORAL | Status: DC | PRN

## 2012-08-10 MED ORDER — OXYCODONE-ACETAMINOPHEN 5-325 MG PO TABS: 1.0000 | ORAL_TABLET | ORAL | Status: DC | PRN

## 2012-08-10 NOTE — Progress Notes (Signed)
Physical Therapy Treatment Patient Details Name: Paula Pollard MRN: [id number] DOB: Sep 14, 1929 Today's Date: 08/10/2012 Time: 4098-1191 PT Time Calculation (min): 14 min  PT Assessment / Plan / Recommendation Comments on Treatment Session  Patient s/p R THA wqith posterior precautions. Patient lives alone and awaiting SNF to increase functional independence prior to returning home. Continue with current POC    Follow Up Recommendations  SNF     Does the patient have the potential to tolerate intense rehabilitation     Barriers to Discharge        Equipment Recommendations  None recommended by PT    Recommendations for Other Services    Frequency 7X/week   Plan Discharge plan remains appropriate;Frequency remains appropriate    Precautions / Restrictions Precautions Precautions: Posterior Hip Precaution Comments: Patient able to verbalize all precautions Restrictions RLE Weight Bearing: Weight bearing as tolerated   Pertinent Vitals/Pain     Mobility  Bed Mobility Bed Mobility: Not assessed Transfers Sit to Stand: 5: Supervision;With upper extremity assist;From chair/3-in-1 Stand to Sit: 5: Supervision;With upper extremity assist;To chair/3-in-1 Details for Transfer Assistance: Cues for R LE and hand placement Ambulation/Gait Ambulation/Gait Assistance: 4: Min guard Ambulation Distance (Feet): 120 Feet Assistive device: Rolling walker Ambulation/Gait Assistance Details: Cues to relax shoulders and increase weight through LEs. Patient working into step through Gait Pattern: Step-to pattern Gait velocity: decreased    Exercises     PT Diagnosis:    PT Problem List:   PT Treatment Interventions:     PT Goals Acute Rehab PT Goals PT Goal: Stand to Sit - Progress: Progressing toward goal PT Goal: Ambulate - Progress: Progressing toward goal  Visit Information  Last PT Received On: 08/10/12 Assistance Needed: +1    Subjective Data      Cognition  Cognition Overall Cognitive Status: Appears within functional limits for tasks assessed/performed Arousal/Alertness: Awake/alert Orientation Level: Appears intact for tasks assessed Behavior During Session: [facility] for tasks performed    Balance     End of Session PT - End of Session Equipment Utilized During Treatment: Gait belt Activity Tolerance: Patient tolerated treatment well Patient left: in chair;with call bell/phone within reach;with family/visitor present Nurse Communication: Mobility status   GP     Fredrich Birks 08/10/2012, 2:57 PM 08/10/2012 Fredrich Birks PTA [phone] pager [phone] office

## 2012-08-10 NOTE — Progress Notes (Signed)
Physical Therapy Treatment Patient Details Name: Paula Pollard MRN: [id number] DOB: [birth year] Today's Date: 08/10/2012 Time: 8413-2440 PT Time Calculation (min): 24 min  PT Assessment / Plan / Recommendation Comments on Treatment Session  Patient s/p R THA wqith posterior precautions. Patient lives alone and awaiting SNF to increase functional independence prior to returning home. Continue with current POC    Follow Up Recommendations  SNF     Does the patient have the potential to tolerate intense rehabilitation     Barriers to Discharge        Equipment Recommendations  None recommended by PT    Recommendations for Other Services    Frequency 7X/week   Plan Discharge plan remains appropriate;Frequency remains appropriate    Precautions / Restrictions Precautions Precautions: Posterior Hip Precaution Comments: Patient able to verbalize all precautions Restrictions RLE Weight Bearing: Weight bearing as tolerated   Pertinent Vitals/Pain     Mobility  Bed Mobility Bed Mobility: Not assessed Transfers Sit to Stand: 4: Min guard;With upper extremity assist;From chair/3-in-1 Stand to Sit: 4: Min guard;With upper extremity assist;To chair/3-in-1 Ambulation/Gait Ambulation/Gait Assistance: 4: Min guard Ambulation Distance (Feet): 120 Feet Assistive device: Rolling walker Ambulation/Gait Assistance Details: Cues for posture and to relax shoulders Gait Pattern: Step-to pattern Gait velocity: decreased    Exercises Total Joint Exercises Quad Sets: AROM;Right;15 reps Gluteal Sets: AROM;Both;15 reps Heel Slides: AAROM;Right;15 reps Hip ABduction/ADduction: AROM;Right;15 reps Long Arc Quad: AROM;15 reps;Right   PT Diagnosis:    PT Problem List:   PT Treatment Interventions:     PT Goals Acute Rehab PT Goals PT Goal: Sit to Stand - Progress: Progressing toward goal PT Goal: Stand to Sit - Progress: Progressing toward goal PT Goal: Ambulate - Progress: Progressing  toward goal PT Goal: Perform Home Exercise Program - Progress: Progressing toward goal  Visit Information  Last PT Received On: 08/10/12    Subjective Data      Cognition  Cognition Overall Cognitive Status: Appears within functional limits for tasks assessed/performed Arousal/Alertness: Awake/alert Orientation Level: Appears intact for tasks assessed Behavior During Session: [facility] for tasks performed    Balance     End of Session PT - End of Session Equipment Utilized During Treatment: Gait belt Activity Tolerance: Patient tolerated treatment well Patient left: in chair;with call bell/phone within reach;with family/visitor present Nurse Communication: Mobility status   GP     Fredrich Birks 08/10/2012, 9:00 AM 08/10/2012 Fredrich Birks PTA [phone] pager [phone] office

## 2012-08-10 NOTE — Discharge Summary (Signed)
Patient ID: Paula Pollard MRN: [id number] DOB/AGE: [birth year] 79 y.o.  Admit date: 08/08/2012 Discharge date: 08/11/12  Admission Diagnoses:  Principal Problem:   Osteoarthritis of right hip   Discharge Diagnoses:  Same  Past Medical History  Diagnosis Date  . Dysrhythmia   . Anxiety   . Bronchitis     hx of  . GERD (gastroesophageal reflux disease)   . Headache     hx of migraines  . Neuromuscular disorder     hx of carpal tunnel "never had surgery for"  . Arthritis   . Anemia     "in past as a young girl"  . Cataract     bilaterally, "no surgery at this time"  . Hyperlipidemia   . Hypertension     sees Dr. Sharyn Lull    Surgeries: Procedure(s): TOTAL HIP ARTHROPLASTY on 08/08/2012   Consultants:    Discharged Condition: Improved  Hospital Course: Paula Pollard is an 79 y.o. female who was admitted 08/08/2012 for operative treatment ofOsteoarthritis of right hip. Patient has severe unremitting pain that affects sleep, daily activities, and work/hobbies. After pre-op clearance the patient was taken to the operating room on 08/08/2012 and underwent  Procedure(s): TOTAL HIP ARTHROPLASTY.    Patient was given perioperative antibiotics: Anti-infectives   Start     Dose/Rate Route Frequency Ordered Stop   08/08/12 0600  ceFAZolin (ANCEF) IVPB 2 g/50 mL premix     2 g 100 mL/hr over 30 Minutes Intravenous On call to O.R. 08/07/12 1336 08/08/12 1009       Patient was given sequential compression devices, early ambulation, and chemoprophylaxis to prevent DVT.  Patient benefited maximally from hospital stay and there were no complications.    Recent vital signs: Patient Vitals for the past 24 hrs:  BP Temp Pulse Resp SpO2  08/10/12 0548 109/54 mmHg 98.3 F (36.8 C) 87 18 97 %  08/09/12 2205 106/58 mmHg - - - -  08/09/12 2119 102/48 mmHg 98.1 F (36.7 C) 79 18 97 %  08/09/12 1600 - - - 18 -  08/09/12 1302 119/62 mmHg 97.9 F (36.6 C) 68 18 94 %  08/09/12 1151 - -  - 18 -  08/09/12 1058 111/52 mmHg - 71 - -     Recent laboratory studies:  Recent Labs  08/09/12 0610 08/10/12 0655  WBC 4.2 5.2  HGB 8.5* 8.2*  HCT 26.0* 24.8*  PLT 154 130*  NA 137  --   K 3.9  --   CL 103  --   CO2 30  --   BUN 8  --   CREATININE 0.77  --   GLUCOSE 116*  --   CALCIUM 7.9*  --      Discharge Medications:     Medication List    STOP taking these medications       HYDROcodone-acetaminophen 5-325 MG per tablet  Commonly known as:  NORCO/VICODIN     ibuprofen 200 MG tablet  Commonly known as:  ADVIL,MOTRIN     nabumetone 500 MG tablet  Commonly known as:  RELAFEN     traMADol 50 MG tablet  Commonly known as:  ULTRAM      TAKE these medications       aspirin 325 MG EC tablet  Take 1 tablet (325 mg total) by mouth 2 (two) times daily.     bisacodyl 5 MG EC tablet  Commonly known as:  DULCOLAX  Take 1 tablet (5 mg total) by mouth  daily as needed.     CALCIUM-VITAMIN D3 PO  Take 1 tablet by mouth every morning. 1200mg  of calcium     docusate sodium 100 MG capsule  Commonly known as:  COLACE  Take 200 mg by mouth daily as needed for constipation. For constipation     ECHINACEA PO  Take 1 capsule by mouth daily as needed. For cold symptoms     GLUCOSAMINE-CHONDROITIN PO  Take 1 capsule by mouth daily.     LORazepam 1 MG tablet  Commonly known as:  ATIVAN  Take 0.5 mg by mouth every 8 (eight) hours as needed for anxiety. anxiety     losartan 50 MG tablet  Commonly known as:  COZAAR  Take 50 mg by mouth every morning.     meclizine 25 MG tablet  Commonly known as:  ANTIVERT  Take 12.5 mg by mouth every morning.     nebivolol 5 MG tablet  Commonly known as:  BYSTOLIC  Take 5 mg by mouth daily.     omeprazole 20 MG capsule  Commonly known as:  PRILOSEC  Take 20 mg by mouth daily as needed. For heartburn     oxyCODONE-acetaminophen 5-325 MG per tablet  Commonly known as:  ROXICET  Take 1-2 tablets by mouth every 4 (four) hours  as needed for pain.     rosuvastatin 20 MG tablet  Commonly known as:  CRESTOR  Take 10 mg by mouth every morning.     triamterene-hydrochlorothiazide 37.5-25 MG per tablet  Commonly known as:  MAXZIDE-25  Take 1 tablet by mouth every morning.     VITAMIN B-12 PO  Take 1 tablet by mouth daily as needed. For energy     VITAMIN E PO  Take 1 capsule by mouth daily as needed. For cold symptoms        Diagnostic Studies: Dg Chest 2 View  07/25/2012  *RADIOLOGY REPORT*  Clinical Data: 79 year old female preoperative study for hip surgery.  Hypertension.  CHEST - 2 VIEW  Comparison: 07/12/2009.  Findings: Stable lung volumes.  Tortuous descending thoracic aorta contour is stable. Other mediastinal contours are within normal limits.  No pneumothorax, pulmonary edema, pleural effusion or acute pulmonary opacity. Stable visualized osseous structures. Multilevel chronic disc and endplate degeneration in the spine.  IMPRESSION: No acute cardiopulmonary abnormality.   Original Report Authenticated By: Erskine Speed, M.D.    Dg Pelvis Portable  08/08/2012  *RADIOLOGY REPORT*  Clinical Data: Status post right hip arthroplasty.  PORTABLE PELVIS  Comparison: None.  Findings: A right total hip arthroplasty demonstrates normal alignment in the frontal projection.  No fracture is identified. There is some gas in the adjacent soft tissues.  IMPRESSION: Normal alignment of right hip arthroplasty.   Original Report Authenticated By: Irish Lack, M.D.    Dg Hip Portable 1 View Right  08/08/2012  *RADIOLOGY REPORT*  Clinical Data: 79 year old female status post right hip surgery.  PORTABLE RIGHT HIP - 1 VIEW  Comparison: 07/12/2009.  Findings: AP portable view of the right hip at 1438 hours. Sequelae of right hip arthroplasty.  Hardware components appear intact normally aligned.  Chronic subchondral sclerosis of the acetabulum.  No unexpected osseous changes identified. Postoperative changes to the surrounding soft  tissues including subcutaneous gas.  IMPRESSION: Right hip arthroplasty with no adverse features identified.   Original Report Authenticated By: Erskine Speed, M.D.     Disposition: 01-Home or Self Care      Discharge Orders   Future  Orders Complete By Expires     Call MD for:  redness, tenderness, or signs of infection (pain, swelling, redness, odor or green/yellow discharge around incision site)  As directed     Call MD for:  severe uncontrolled pain  As directed     Call MD for:  temperature >100.4  As directed     Change dressing (specify)  As directed     Comments:      Dressing change as needed.    Discharge instructions  As directed     Comments:      F/U with Dr. Turner Daniels as scheduled.    Driving Restrictions  As directed     Comments:      No driving for 2 weeks.    Increase activity slowly  As directed     May shower / Bathe  As directed     Walker   As directed           Signed: WILLIAMS,JENNIFER M. 08/10/2012, 8:08 AM

## 2012-08-10 NOTE — Progress Notes (Signed)
PATIENT ID: Paula Pollard  MRN: [id number]  DOB/AGE:  1929-09-06 / 79 y.o.  2 Days Post-Op Procedure(s) (LRB): TOTAL HIP ARTHROPLASTY (Right)    PROGRESS NOTE Subjective: Patient is alert, oriented,no Nausea, no Vomiting, yes passing gas, no Bowel Movement. Taking PO well. Denies SOB, Chest or Calf Pain. Using Incentive Spirometer, PAS in place. Ambulating slowly with PT. Patient reports pain as moderate  .    Objective: Vital signs in last 24 hours: Filed Vitals:   08/09/12 1600 08/09/12 2119 08/09/12 2205 08/10/12 0548  BP:  102/48 106/58 109/54  Pulse:  79  87  Temp:  98.1 F (36.7 C)  98.3 F (36.8 C)  TempSrc:      Resp: 18 18  18   SpO2:  97%  97%      Intake/Output from previous day: I/O last 3 completed shifts: In: 2220 [P.O.:720; I.V.:1500] Out: 1651 [Urine:1651]   Intake/Output this shift:     LABORATORY DATA:  Recent Labs  08/09/12 0610 08/10/12 0655  WBC 4.2 5.2  HGB 8.5* 8.2*  HCT 26.0* 24.8*  PLT 154 130*  NA 137  --   K 3.9  --   CL 103  --   CO2 30  --   BUN 8  --   CREATININE 0.77  --   GLUCOSE 116*  --   CALCIUM 7.9*  --     Examination: Neurologically intact ABD soft Neurovascular intact Sensation intact distally Intact pulses distally Dorsiflexion/Plantar flexion intact Incision: scant drainage} XR AP&Lat of hip shows well placed\fixed THA  Assessment:   2 Days Post-Op Procedure(s) (LRB): TOTAL HIP ARTHROPLASTY (Right) ADDITIONAL DIAGNOSIS:  Acute Blood Loss Anemia - asymptomatic  Plan: PT/OT WBAT, THA  posterior precautions  DVT Prophylaxis: SCDx72 hrs, ASA 325 mg BID x 2 weeks  DISCHARGE PLAN: Skilled Nursing Facility/Rehab today if bed available  DISCHARGE NEEDS: HHPT, HHRN, Walker and 3-in-1 comode seat

## 2012-08-11 DIAGNOSIS — M169 Osteoarthritis of hip, unspecified: Secondary | ICD-10-CM | POA: Diagnosis not present

## 2012-08-11 DIAGNOSIS — M25559 Pain in unspecified hip: Secondary | ICD-10-CM | POA: Diagnosis not present

## 2012-08-11 DIAGNOSIS — H8110 Benign paroxysmal vertigo, unspecified ear: Secondary | ICD-10-CM | POA: Diagnosis not present

## 2012-08-11 DIAGNOSIS — R42 Dizziness and giddiness: Secondary | ICD-10-CM | POA: Diagnosis not present

## 2012-08-11 DIAGNOSIS — K2100 Gastro-esophageal reflux disease with esophagitis, without bleeding: Secondary | ICD-10-CM | POA: Diagnosis not present

## 2012-08-11 DIAGNOSIS — S79919A Unspecified injury of unspecified hip, initial encounter: Secondary | ICD-10-CM | POA: Diagnosis not present

## 2012-08-11 DIAGNOSIS — R279 Unspecified lack of coordination: Secondary | ICD-10-CM | POA: Diagnosis not present

## 2012-08-11 DIAGNOSIS — Z96649 Presence of unspecified artificial hip joint: Secondary | ICD-10-CM | POA: Diagnosis not present

## 2012-08-11 DIAGNOSIS — Z471 Aftercare following joint replacement surgery: Secondary | ICD-10-CM | POA: Diagnosis not present

## 2012-08-11 DIAGNOSIS — M1990 Unspecified osteoarthritis, unspecified site: Secondary | ICD-10-CM | POA: Diagnosis not present

## 2012-08-11 DIAGNOSIS — M6281 Muscle weakness (generalized): Secondary | ICD-10-CM | POA: Diagnosis not present

## 2012-08-11 DIAGNOSIS — I10 Essential (primary) hypertension: Secondary | ICD-10-CM | POA: Diagnosis not present

## 2012-08-11 DIAGNOSIS — M1610 Unilateral primary osteoarthritis, unspecified hip: Secondary | ICD-10-CM | POA: Diagnosis not present

## 2012-08-11 DIAGNOSIS — D62 Acute posthemorrhagic anemia: Secondary | ICD-10-CM | POA: Diagnosis not present

## 2012-08-11 DIAGNOSIS — F411 Generalized anxiety disorder: Secondary | ICD-10-CM | POA: Diagnosis not present

## 2012-08-11 DIAGNOSIS — R269 Unspecified abnormalities of gait and mobility: Secondary | ICD-10-CM | POA: Diagnosis not present

## 2012-08-11 DIAGNOSIS — K219 Gastro-esophageal reflux disease without esophagitis: Secondary | ICD-10-CM | POA: Diagnosis not present

## 2012-08-11 DIAGNOSIS — E785 Hyperlipidemia, unspecified: Secondary | ICD-10-CM | POA: Diagnosis not present

## 2012-08-11 LAB — CBC
Hemoglobin: 7.8 g/dL — ABNORMAL LOW (ref 12.0–15.0)
MCH: 29.8 pg (ref 26.0–34.0)
Platelets: 143 10*3/uL — ABNORMAL LOW (ref 150–400)
RBC: 2.62 MIL/uL — ABNORMAL LOW (ref 3.87–5.11)
WBC: 4.6 10*3/uL (ref 4.0–10.5)

## 2012-08-11 NOTE — Clinical Social Work Note (Signed)
CSW was consulted to complete discharge of patient. Pt to transfer to [facility] today via [facility]. Facility and family are aware of d/c. D/C packet complete with chart copy, signed FL2, and signed hard Rx.  CSW signing off as no other CSW needs identified at this time.  Caitlin Mayton, LCSWA [facility] Clinical Social Worker Contact #: [phone]  

## 2012-08-11 NOTE — Progress Notes (Signed)
Physical Therapy Treatment Patient Details Name: ROSHANNA CIMINO MRN: [id number] DOB: [birth year] Today's Date: 08/11/2012 Time: 1308-6578 PT Time Calculation (min): 26 min  PT Assessment / Plan / Recommendation Comments on Treatment Session  Patient s/p R THA wqith posterior precautions. Patient lives alone and awaiting SNF to increase functional independence prior to returning home. Continue with current POC    Follow Up Recommendations  SNF     Does the patient have the potential to tolerate intense rehabilitation     Barriers to Discharge        Equipment Recommendations  None recommended by PT    Recommendations for Other Services    Frequency 7X/week   Plan Discharge plan remains appropriate;Frequency remains appropriate    Precautions / Restrictions Precautions Precautions: Posterior Hip Precaution Comments: Patient able to verbalize all precautions Restrictions RLE Weight Bearing: Weight bearing as tolerated   Pertinent Vitals/Pain no apparent distress     Mobility  Bed Mobility Bed Mobility: Not assessed Transfers Sit to Stand: 5: Supervision Stand to Sit: 5: Supervision Details for Transfer Assistance: Cues for R LE and hand placement Ambulation/Gait Ambulation/Gait Assistance: 5: Supervision Ambulation Distance (Feet): 120 Feet Assistive device: Rolling walker Gait Pattern: Step-through pattern;Decreased stride length Gait velocity: decreased    Exercises Total Joint Exercises Hip ABduction/ADduction: AROM;Left;15 reps Long Arc Quad: AROM;15 reps;Right   PT Diagnosis:    PT Problem List:   PT Treatment Interventions:     PT Goals Acute Rehab PT Goals PT Goal: Sit to Stand - Progress: Met PT Goal: Stand to Sit - Progress: Met PT Goal: Ambulate - Progress: Progressing toward goal PT Goal: Perform Home Exercise Program - Progress: Progressing toward goal  Visit Information  Last PT Received On: 08/11/12 Assistance Needed: +1    Subjective  Data      Cognition  Cognition Overall Cognitive Status: Appears within functional limits for tasks assessed/performed Arousal/Alertness: Awake/alert Orientation Level: Appears intact for tasks assessed Behavior During Session: [facility] for tasks performed    Balance     End of Session PT - End of Session Equipment Utilized During Treatment: Gait belt Activity Tolerance: Patient tolerated treatment well Patient left: in chair;with call bell/phone within reach;with family/visitor present Nurse Communication: Mobility status   GP     Fredrich Birks 08/11/2012, 10:15 AM 08/11/2012 Fredrich Birks PTA [phone] pager [phone] office

## 2012-08-11 NOTE — Progress Notes (Signed)
Patient d/c to [facility] : [facility] this afternoon.  Report called to nurse.

## 2012-08-11 NOTE — Progress Notes (Signed)
PATIENT ID: Paula Pollard  MRN: [id number]  DOB/AGE:  77/01/1930 / 77 y.o.  3 Days Post-Op Procedure(s) (LRB): TOTAL HIP ARTHROPLASTY (Right)    PROGRESS NOTE Subjective: Patient is alert, oriented,no Nausea, no Vomiting, yes passing gas, no Bowel Movement. Taking PO well. Denies SOB, Chest or Calf Pain. Using Incentive Spirometer, PAS in place. Ambulating well with PT. Patient reports pain as moderate  .    Objective: Vital signs in last 24 hours: Filed Vitals:   08/10/12 1511 08/10/12 2111 08/11/12 0603 08/11/12 0809  BP: 97/48 109/44 93/46 102/50  Pulse: 82 80 77   Temp: 98.2 F (36.8 C) 98.4 F (36.9 C) 98.8 F (37.1 C)   TempSrc:      Resp: 18 18 20    SpO2: 100% 98% 95%       Intake/Output from previous day: I/O last 3 completed shifts: In: 720 [P.O.:480; I.V.:240] Out: 2 [Urine:2]   Intake/Output this shift:     LABORATORY DATA:  Recent Labs  08/09/12 0610 08/10/12 0655 08/11/12 0400  WBC 4.2 5.2 4.6  HGB 8.5* 8.2* 7.8*  HCT 26.0* 24.8* 23.1*  PLT 154 130* 143*  NA 137  --   --   K 3.9  --   --   CL 103  --   --   CO2 30  --   --   BUN 8  --   --   CREATININE 0.77  --   --   GLUCOSE 116*  --   --   CALCIUM 7.9*  --   --     Examination: Neurologically intact ABD soft Neurovascular intact Sensation intact distally Intact pulses distally Dorsiflexion/Plantar flexion intact Incision: scant drainage} XR AP&Lat of hip shows well placed\fixed THA  Assessment:   3 Days Post-Op Procedure(s) (LRB): TOTAL HIP ARTHROPLASTY (Right) ADDITIONAL DIAGNOSIS:  Acute Blood Loss Anemia - asymptomatic  Plan: PT/OT WBAT, THA  posterior precautions  DVT Prophylaxis: SCDx72 hrs, ASA 325 mg BID x 2 weeks  DISCHARGE PLAN: Skilled Nursing Facility/Rehab today  DISCHARGE NEEDS: HHPT, HHRN, Walker and 3-in-1 comode seat

## 2012-08-16 DIAGNOSIS — K219 Gastro-esophageal reflux disease without esophagitis: Secondary | ICD-10-CM | POA: Diagnosis not present

## 2012-08-16 DIAGNOSIS — M1610 Unilateral primary osteoarthritis, unspecified hip: Secondary | ICD-10-CM | POA: Diagnosis not present

## 2012-08-16 DIAGNOSIS — I10 Essential (primary) hypertension: Secondary | ICD-10-CM | POA: Diagnosis not present

## 2012-08-16 DIAGNOSIS — D62 Acute posthemorrhagic anemia: Secondary | ICD-10-CM | POA: Diagnosis not present

## 2012-08-24 DIAGNOSIS — M169 Osteoarthritis of hip, unspecified: Secondary | ICD-10-CM | POA: Diagnosis not present

## 2012-08-25 ENCOUNTER — Non-Acute Institutional Stay (SKILLED_NURSING_FACILITY): Payer: Medicare Other | Admitting: Adult Health

## 2012-08-25 ENCOUNTER — Encounter: Payer: Self-pay | Admitting: Adult Health

## 2012-08-25 DIAGNOSIS — E785 Hyperlipidemia, unspecified: Secondary | ICD-10-CM | POA: Insufficient documentation

## 2012-08-25 DIAGNOSIS — K219 Gastro-esophageal reflux disease without esophagitis: Secondary | ICD-10-CM

## 2012-08-25 DIAGNOSIS — H8110 Benign paroxysmal vertigo, unspecified ear: Secondary | ICD-10-CM

## 2012-08-25 DIAGNOSIS — F411 Generalized anxiety disorder: Secondary | ICD-10-CM

## 2012-08-25 DIAGNOSIS — K5900 Constipation, unspecified: Secondary | ICD-10-CM

## 2012-08-25 DIAGNOSIS — M1611 Unilateral primary osteoarthritis, right hip: Secondary | ICD-10-CM

## 2012-08-25 DIAGNOSIS — M169 Osteoarthritis of hip, unspecified: Secondary | ICD-10-CM

## 2012-08-25 DIAGNOSIS — I10 Essential (primary) hypertension: Secondary | ICD-10-CM | POA: Diagnosis not present

## 2012-08-25 MED ORDER — LOSARTAN POTASSIUM 50 MG PO TABS: 50.0000 mg | ORAL_TABLET | Freq: Every morning | ORAL | Status: DC

## 2012-08-25 MED ORDER — DOCUSATE SODIUM 100 MG PO CAPS: 200.0000 mg | ORAL_CAPSULE | Freq: Every day | ORAL | Status: DC | PRN

## 2012-08-25 MED ORDER — MECLIZINE HCL 25 MG PO TABS: 12.5000 mg | ORAL_TABLET | Freq: Every morning | ORAL | Status: DC

## 2012-08-25 MED ORDER — OXYCODONE-ACETAMINOPHEN 5-325 MG PO TABS: 1.0000 | ORAL_TABLET | ORAL | Status: DC | PRN

## 2012-08-25 MED ORDER — ASPIRIN 325 MG PO TBEC: 325.0000 mg | DELAYED_RELEASE_TABLET | Freq: Two times a day (BID) | ORAL | Status: DC

## 2012-08-25 MED ORDER — TRIAMTERENE-HCTZ 37.5-25 MG PO TABS: 1.0000 | ORAL_TABLET | Freq: Every morning | ORAL | Status: DC

## 2012-08-25 MED ORDER — LORAZEPAM 1 MG PO TABS: 0.5000 mg | ORAL_TABLET | Freq: Three times a day (TID) | ORAL | Status: DC | PRN

## 2012-08-25 MED ORDER — BISACODYL 5 MG PO TBEC: 5.0000 mg | DELAYED_RELEASE_TABLET | Freq: Every day | ORAL | Status: DC | PRN

## 2012-08-25 MED ORDER — ATORVASTATIN CALCIUM 40 MG PO TABS: 40.0000 mg | ORAL_TABLET | Freq: Every evening | ORAL | Status: DC

## 2012-08-25 MED ORDER — NEBIVOLOL HCL 5 MG PO TABS: 5.0000 mg | ORAL_TABLET | Freq: Every day | ORAL | Status: DC

## 2012-08-25 MED ORDER — CALCIUM-VITAMIN D3 600-125 MG-UNIT PO TABS: 2.0000 | ORAL_TABLET | Freq: Every day | ORAL | Status: DC

## 2012-08-25 MED ORDER — OMEPRAZOLE 20 MG PO CPDR: 20.0000 mg | DELAYED_RELEASE_CAPSULE | Freq: Every day | ORAL | Status: DC | PRN

## 2012-08-25 NOTE — Progress Notes (Signed)
  Subjective:    Patient ID: Paula Pollard, female    DOB: [birth year], 79 y.o.   MRN: [id number]  HPI This is an 79 year old female who is for discharge home with Home health PT, OT and Nursing. She has been admitted to [facility] on 08/11/12 from [facility] with osteoarthritis S/P right total hip arthroplasty. She had a short-term rehabilitation at [facility] and is now for discharge home.   Review of Systems  Constitutional: Negative.   HENT: Negative.   Eyes: Negative.   Respiratory: Negative.   Cardiovascular: Negative.   Gastrointestinal: Negative.   Endocrine: Negative for cold intolerance.  Genitourinary: Negative.   Neurological: Negative.   Psychiatric/Behavioral: Negative.        Objective:   Physical Exam  Constitutional: She is oriented to person, place, and time. She appears well-developed and well-nourished.  HENT:  Head: Normocephalic.  Right Ear: External ear normal.  Left Ear: External ear normal.  Eyes: Conjunctivae are normal. Pupils are equal, round, and reactive to light.  Neck: Normal range of motion. Neck supple.  Cardiovascular: Normal rate, regular rhythm and normal heart sounds.   Pulmonary/Chest: Effort normal and breath sounds normal.  Abdominal: Soft. Bowel sounds are normal.  Musculoskeletal: Normal range of motion. She exhibits edema.  RLE Edema, 2+  Neurological: She is alert and oriented to person, place, and time. No cranial nerve deficit.  Skin: Skin is warm and dry.  Psychiatric: She has a normal mood and affect.  LABS: 3/13  Wbc 5.8  hgb 9.0  hct 27.7  Bmp nl        Assessment & Plan:  Osteoarthritis of right hip S/P Right Total Hip Arthroplasty - stable; had short-term rehabilitation and will be followed-up by Home health PT, OT and Nursing  Essential hypertension, benign - well-controlled  Other and unspecified hyperlipidemia - stable.  Benign paroxysmal positional vertigo - stable.  Unspecified constipation -  stable  Generalized anxiety disorder - stable  GERD (gastroesophageal reflux disease) - stable

## 2012-08-26 DIAGNOSIS — Z96649 Presence of unspecified artificial hip joint: Secondary | ICD-10-CM | POA: Diagnosis not present

## 2012-08-26 DIAGNOSIS — Z471 Aftercare following joint replacement surgery: Secondary | ICD-10-CM | POA: Diagnosis not present

## 2012-08-26 DIAGNOSIS — R42 Dizziness and giddiness: Secondary | ICD-10-CM | POA: Diagnosis not present

## 2012-08-26 DIAGNOSIS — R269 Unspecified abnormalities of gait and mobility: Secondary | ICD-10-CM | POA: Diagnosis not present

## 2012-08-26 DIAGNOSIS — I10 Essential (primary) hypertension: Secondary | ICD-10-CM | POA: Diagnosis not present

## 2012-08-26 DIAGNOSIS — F411 Generalized anxiety disorder: Secondary | ICD-10-CM | POA: Diagnosis not present

## 2012-08-29 DIAGNOSIS — I10 Essential (primary) hypertension: Secondary | ICD-10-CM | POA: Diagnosis not present

## 2012-08-29 DIAGNOSIS — Z96649 Presence of unspecified artificial hip joint: Secondary | ICD-10-CM | POA: Diagnosis not present

## 2012-08-29 DIAGNOSIS — F411 Generalized anxiety disorder: Secondary | ICD-10-CM | POA: Diagnosis not present

## 2012-08-29 DIAGNOSIS — R42 Dizziness and giddiness: Secondary | ICD-10-CM | POA: Diagnosis not present

## 2012-08-29 DIAGNOSIS — R269 Unspecified abnormalities of gait and mobility: Secondary | ICD-10-CM | POA: Diagnosis not present

## 2012-08-29 DIAGNOSIS — Z471 Aftercare following joint replacement surgery: Secondary | ICD-10-CM | POA: Diagnosis not present

## 2012-08-30 DIAGNOSIS — Z471 Aftercare following joint replacement surgery: Secondary | ICD-10-CM | POA: Diagnosis not present

## 2012-08-30 DIAGNOSIS — Z96649 Presence of unspecified artificial hip joint: Secondary | ICD-10-CM | POA: Diagnosis not present

## 2012-08-30 DIAGNOSIS — R42 Dizziness and giddiness: Secondary | ICD-10-CM | POA: Diagnosis not present

## 2012-08-30 DIAGNOSIS — F411 Generalized anxiety disorder: Secondary | ICD-10-CM | POA: Diagnosis not present

## 2012-08-30 DIAGNOSIS — R269 Unspecified abnormalities of gait and mobility: Secondary | ICD-10-CM | POA: Diagnosis not present

## 2012-08-30 DIAGNOSIS — I10 Essential (primary) hypertension: Secondary | ICD-10-CM | POA: Diagnosis not present

## 2012-09-01 DIAGNOSIS — I10 Essential (primary) hypertension: Secondary | ICD-10-CM | POA: Diagnosis not present

## 2012-09-01 DIAGNOSIS — R42 Dizziness and giddiness: Secondary | ICD-10-CM | POA: Diagnosis not present

## 2012-09-01 DIAGNOSIS — F411 Generalized anxiety disorder: Secondary | ICD-10-CM | POA: Diagnosis not present

## 2012-09-01 DIAGNOSIS — Z96649 Presence of unspecified artificial hip joint: Secondary | ICD-10-CM | POA: Diagnosis not present

## 2012-09-01 DIAGNOSIS — R269 Unspecified abnormalities of gait and mobility: Secondary | ICD-10-CM | POA: Diagnosis not present

## 2012-09-01 DIAGNOSIS — Z471 Aftercare following joint replacement surgery: Secondary | ICD-10-CM | POA: Diagnosis not present

## 2012-09-02 DIAGNOSIS — R42 Dizziness and giddiness: Secondary | ICD-10-CM | POA: Diagnosis not present

## 2012-09-02 DIAGNOSIS — I10 Essential (primary) hypertension: Secondary | ICD-10-CM | POA: Diagnosis not present

## 2012-09-02 DIAGNOSIS — R269 Unspecified abnormalities of gait and mobility: Secondary | ICD-10-CM | POA: Diagnosis not present

## 2012-09-02 DIAGNOSIS — F411 Generalized anxiety disorder: Secondary | ICD-10-CM | POA: Diagnosis not present

## 2012-09-02 DIAGNOSIS — Z471 Aftercare following joint replacement surgery: Secondary | ICD-10-CM | POA: Diagnosis not present

## 2012-09-02 DIAGNOSIS — Z96649 Presence of unspecified artificial hip joint: Secondary | ICD-10-CM | POA: Diagnosis not present

## 2012-09-05 DIAGNOSIS — R269 Unspecified abnormalities of gait and mobility: Secondary | ICD-10-CM | POA: Diagnosis not present

## 2012-09-05 DIAGNOSIS — I10 Essential (primary) hypertension: Secondary | ICD-10-CM | POA: Diagnosis not present

## 2012-09-05 DIAGNOSIS — Z471 Aftercare following joint replacement surgery: Secondary | ICD-10-CM | POA: Diagnosis not present

## 2012-09-05 DIAGNOSIS — Z96649 Presence of unspecified artificial hip joint: Secondary | ICD-10-CM | POA: Diagnosis not present

## 2012-09-05 DIAGNOSIS — R42 Dizziness and giddiness: Secondary | ICD-10-CM | POA: Diagnosis not present

## 2012-09-05 DIAGNOSIS — F411 Generalized anxiety disorder: Secondary | ICD-10-CM | POA: Diagnosis not present

## 2012-09-06 DIAGNOSIS — I10 Essential (primary) hypertension: Secondary | ICD-10-CM | POA: Diagnosis not present

## 2012-09-06 DIAGNOSIS — R269 Unspecified abnormalities of gait and mobility: Secondary | ICD-10-CM | POA: Diagnosis not present

## 2012-09-06 DIAGNOSIS — Z471 Aftercare following joint replacement surgery: Secondary | ICD-10-CM | POA: Diagnosis not present

## 2012-09-06 DIAGNOSIS — F411 Generalized anxiety disorder: Secondary | ICD-10-CM | POA: Diagnosis not present

## 2012-09-06 DIAGNOSIS — Z96649 Presence of unspecified artificial hip joint: Secondary | ICD-10-CM | POA: Diagnosis not present

## 2012-09-06 DIAGNOSIS — R42 Dizziness and giddiness: Secondary | ICD-10-CM | POA: Diagnosis not present

## 2012-09-08 DIAGNOSIS — R42 Dizziness and giddiness: Secondary | ICD-10-CM | POA: Diagnosis not present

## 2012-09-08 DIAGNOSIS — F411 Generalized anxiety disorder: Secondary | ICD-10-CM | POA: Diagnosis not present

## 2012-09-08 DIAGNOSIS — Z96649 Presence of unspecified artificial hip joint: Secondary | ICD-10-CM | POA: Diagnosis not present

## 2012-09-08 DIAGNOSIS — I10 Essential (primary) hypertension: Secondary | ICD-10-CM | POA: Diagnosis not present

## 2012-09-08 DIAGNOSIS — R269 Unspecified abnormalities of gait and mobility: Secondary | ICD-10-CM | POA: Diagnosis not present

## 2012-09-08 DIAGNOSIS — Z471 Aftercare following joint replacement surgery: Secondary | ICD-10-CM | POA: Diagnosis not present

## 2012-09-09 DIAGNOSIS — I10 Essential (primary) hypertension: Secondary | ICD-10-CM | POA: Diagnosis not present

## 2012-09-09 DIAGNOSIS — E7800 Pure hypercholesterolemia, unspecified: Secondary | ICD-10-CM | POA: Diagnosis not present

## 2012-09-09 DIAGNOSIS — I2510 Atherosclerotic heart disease of native coronary artery without angina pectoris: Secondary | ICD-10-CM | POA: Diagnosis not present

## 2012-09-12 DIAGNOSIS — R269 Unspecified abnormalities of gait and mobility: Secondary | ICD-10-CM | POA: Diagnosis not present

## 2012-09-12 DIAGNOSIS — R42 Dizziness and giddiness: Secondary | ICD-10-CM | POA: Diagnosis not present

## 2012-09-12 DIAGNOSIS — I10 Essential (primary) hypertension: Secondary | ICD-10-CM | POA: Diagnosis not present

## 2012-09-12 DIAGNOSIS — Z471 Aftercare following joint replacement surgery: Secondary | ICD-10-CM | POA: Diagnosis not present

## 2012-09-12 DIAGNOSIS — F411 Generalized anxiety disorder: Secondary | ICD-10-CM | POA: Diagnosis not present

## 2012-09-12 DIAGNOSIS — Z96649 Presence of unspecified artificial hip joint: Secondary | ICD-10-CM | POA: Diagnosis not present

## 2012-09-14 DIAGNOSIS — Z471 Aftercare following joint replacement surgery: Secondary | ICD-10-CM | POA: Diagnosis not present

## 2012-09-14 DIAGNOSIS — Z96649 Presence of unspecified artificial hip joint: Secondary | ICD-10-CM | POA: Diagnosis not present

## 2012-09-14 DIAGNOSIS — I10 Essential (primary) hypertension: Secondary | ICD-10-CM | POA: Diagnosis not present

## 2012-09-14 DIAGNOSIS — R269 Unspecified abnormalities of gait and mobility: Secondary | ICD-10-CM | POA: Diagnosis not present

## 2012-09-14 DIAGNOSIS — F411 Generalized anxiety disorder: Secondary | ICD-10-CM | POA: Diagnosis not present

## 2012-09-14 DIAGNOSIS — R42 Dizziness and giddiness: Secondary | ICD-10-CM | POA: Diagnosis not present

## 2012-09-15 DIAGNOSIS — Z96649 Presence of unspecified artificial hip joint: Secondary | ICD-10-CM | POA: Diagnosis not present

## 2012-09-15 DIAGNOSIS — R269 Unspecified abnormalities of gait and mobility: Secondary | ICD-10-CM | POA: Diagnosis not present

## 2012-09-15 DIAGNOSIS — I10 Essential (primary) hypertension: Secondary | ICD-10-CM | POA: Diagnosis not present

## 2012-09-15 DIAGNOSIS — Z471 Aftercare following joint replacement surgery: Secondary | ICD-10-CM | POA: Diagnosis not present

## 2012-09-15 DIAGNOSIS — F411 Generalized anxiety disorder: Secondary | ICD-10-CM | POA: Diagnosis not present

## 2012-09-15 DIAGNOSIS — R42 Dizziness and giddiness: Secondary | ICD-10-CM | POA: Diagnosis not present

## 2012-09-19 DIAGNOSIS — R42 Dizziness and giddiness: Secondary | ICD-10-CM | POA: Diagnosis not present

## 2012-09-19 DIAGNOSIS — F411 Generalized anxiety disorder: Secondary | ICD-10-CM | POA: Diagnosis not present

## 2012-09-19 DIAGNOSIS — I10 Essential (primary) hypertension: Secondary | ICD-10-CM | POA: Diagnosis not present

## 2012-09-19 DIAGNOSIS — Z96649 Presence of unspecified artificial hip joint: Secondary | ICD-10-CM | POA: Diagnosis not present

## 2012-09-19 DIAGNOSIS — R269 Unspecified abnormalities of gait and mobility: Secondary | ICD-10-CM | POA: Diagnosis not present

## 2012-09-19 DIAGNOSIS — Z471 Aftercare following joint replacement surgery: Secondary | ICD-10-CM | POA: Diagnosis not present

## 2012-09-21 DIAGNOSIS — R269 Unspecified abnormalities of gait and mobility: Secondary | ICD-10-CM | POA: Diagnosis not present

## 2012-09-21 DIAGNOSIS — Z471 Aftercare following joint replacement surgery: Secondary | ICD-10-CM | POA: Diagnosis not present

## 2012-09-21 DIAGNOSIS — Z96649 Presence of unspecified artificial hip joint: Secondary | ICD-10-CM | POA: Diagnosis not present

## 2012-09-21 DIAGNOSIS — I10 Essential (primary) hypertension: Secondary | ICD-10-CM | POA: Diagnosis not present

## 2012-09-21 DIAGNOSIS — R42 Dizziness and giddiness: Secondary | ICD-10-CM | POA: Diagnosis not present

## 2012-09-21 DIAGNOSIS — F411 Generalized anxiety disorder: Secondary | ICD-10-CM | POA: Diagnosis not present

## 2012-09-27 DIAGNOSIS — R42 Dizziness and giddiness: Secondary | ICD-10-CM | POA: Diagnosis not present

## 2012-09-27 DIAGNOSIS — Z471 Aftercare following joint replacement surgery: Secondary | ICD-10-CM | POA: Diagnosis not present

## 2012-09-27 DIAGNOSIS — I10 Essential (primary) hypertension: Secondary | ICD-10-CM | POA: Diagnosis not present

## 2012-09-27 DIAGNOSIS — F411 Generalized anxiety disorder: Secondary | ICD-10-CM | POA: Diagnosis not present

## 2012-09-27 DIAGNOSIS — R269 Unspecified abnormalities of gait and mobility: Secondary | ICD-10-CM | POA: Diagnosis not present

## 2012-09-27 DIAGNOSIS — Z96649 Presence of unspecified artificial hip joint: Secondary | ICD-10-CM | POA: Diagnosis not present

## 2012-09-29 DIAGNOSIS — R42 Dizziness and giddiness: Secondary | ICD-10-CM | POA: Diagnosis not present

## 2012-09-29 DIAGNOSIS — Z96649 Presence of unspecified artificial hip joint: Secondary | ICD-10-CM | POA: Diagnosis not present

## 2012-09-29 DIAGNOSIS — R269 Unspecified abnormalities of gait and mobility: Secondary | ICD-10-CM | POA: Diagnosis not present

## 2012-09-29 DIAGNOSIS — F411 Generalized anxiety disorder: Secondary | ICD-10-CM | POA: Diagnosis not present

## 2012-09-29 DIAGNOSIS — I10 Essential (primary) hypertension: Secondary | ICD-10-CM | POA: Diagnosis not present

## 2012-09-29 DIAGNOSIS — Z471 Aftercare following joint replacement surgery: Secondary | ICD-10-CM | POA: Diagnosis not present

## 2012-10-04 DIAGNOSIS — Z471 Aftercare following joint replacement surgery: Secondary | ICD-10-CM | POA: Diagnosis not present

## 2012-10-04 DIAGNOSIS — Z96649 Presence of unspecified artificial hip joint: Secondary | ICD-10-CM | POA: Diagnosis not present

## 2012-10-04 DIAGNOSIS — F411 Generalized anxiety disorder: Secondary | ICD-10-CM | POA: Diagnosis not present

## 2012-10-04 DIAGNOSIS — R42 Dizziness and giddiness: Secondary | ICD-10-CM | POA: Diagnosis not present

## 2012-10-04 DIAGNOSIS — R269 Unspecified abnormalities of gait and mobility: Secondary | ICD-10-CM | POA: Diagnosis not present

## 2012-10-04 DIAGNOSIS — I10 Essential (primary) hypertension: Secondary | ICD-10-CM | POA: Diagnosis not present

## 2012-10-05 DIAGNOSIS — M1710 Unilateral primary osteoarthritis, unspecified knee: Secondary | ICD-10-CM | POA: Diagnosis not present

## 2012-10-05 DIAGNOSIS — M169 Osteoarthritis of hip, unspecified: Secondary | ICD-10-CM | POA: Diagnosis not present

## 2012-10-06 DIAGNOSIS — R269 Unspecified abnormalities of gait and mobility: Secondary | ICD-10-CM | POA: Diagnosis not present

## 2012-10-06 DIAGNOSIS — Z96649 Presence of unspecified artificial hip joint: Secondary | ICD-10-CM | POA: Diagnosis not present

## 2012-10-06 DIAGNOSIS — I10 Essential (primary) hypertension: Secondary | ICD-10-CM | POA: Diagnosis not present

## 2012-10-06 DIAGNOSIS — R42 Dizziness and giddiness: Secondary | ICD-10-CM | POA: Diagnosis not present

## 2012-10-06 DIAGNOSIS — Z471 Aftercare following joint replacement surgery: Secondary | ICD-10-CM | POA: Diagnosis not present

## 2012-10-06 DIAGNOSIS — F411 Generalized anxiety disorder: Secondary | ICD-10-CM | POA: Diagnosis not present

## 2012-10-12 DIAGNOSIS — M1710 Unilateral primary osteoarthritis, unspecified knee: Secondary | ICD-10-CM | POA: Diagnosis not present

## 2012-10-25 DIAGNOSIS — M1710 Unilateral primary osteoarthritis, unspecified knee: Secondary | ICD-10-CM | POA: Diagnosis not present

## 2012-10-27 DIAGNOSIS — H26019 Infantile and juvenile cortical, lamellar, or zonular cataract, unspecified eye: Secondary | ICD-10-CM | POA: Diagnosis not present

## 2012-10-27 DIAGNOSIS — H2510 Age-related nuclear cataract, unspecified eye: Secondary | ICD-10-CM | POA: Diagnosis not present

## 2012-11-01 DIAGNOSIS — M1710 Unilateral primary osteoarthritis, unspecified knee: Secondary | ICD-10-CM | POA: Diagnosis not present

## 2012-11-08 DIAGNOSIS — M1710 Unilateral primary osteoarthritis, unspecified knee: Secondary | ICD-10-CM | POA: Diagnosis not present

## 2012-11-28 DIAGNOSIS — Z1231 Encounter for screening mammogram for malignant neoplasm of breast: Secondary | ICD-10-CM | POA: Diagnosis not present

## 2012-11-28 DIAGNOSIS — Z803 Family history of malignant neoplasm of breast: Secondary | ICD-10-CM | POA: Diagnosis not present

## 2012-12-26 DIAGNOSIS — I2510 Atherosclerotic heart disease of native coronary artery without angina pectoris: Secondary | ICD-10-CM | POA: Diagnosis not present

## 2012-12-26 DIAGNOSIS — E7800 Pure hypercholesterolemia, unspecified: Secondary | ICD-10-CM | POA: Diagnosis not present

## 2012-12-26 DIAGNOSIS — I10 Essential (primary) hypertension: Secondary | ICD-10-CM | POA: Diagnosis not present

## 2013-03-27 DIAGNOSIS — I10 Essential (primary) hypertension: Secondary | ICD-10-CM | POA: Diagnosis not present

## 2013-03-27 DIAGNOSIS — K219 Gastro-esophageal reflux disease without esophagitis: Secondary | ICD-10-CM | POA: Diagnosis not present

## 2013-03-27 DIAGNOSIS — I2510 Atherosclerotic heart disease of native coronary artery without angina pectoris: Secondary | ICD-10-CM | POA: Diagnosis not present

## 2013-05-02 DIAGNOSIS — Z01419 Encounter for gynecological examination (general) (routine) without abnormal findings: Secondary | ICD-10-CM | POA: Diagnosis not present

## 2013-05-02 DIAGNOSIS — Z124 Encounter for screening for malignant neoplasm of cervix: Secondary | ICD-10-CM | POA: Diagnosis not present

## 2013-06-06 DIAGNOSIS — M545 Low back pain, unspecified: Secondary | ICD-10-CM | POA: Diagnosis not present

## 2013-06-06 DIAGNOSIS — IMO0002 Reserved for concepts with insufficient information to code with codable children: Secondary | ICD-10-CM | POA: Diagnosis not present

## 2013-06-23 DIAGNOSIS — E7800 Pure hypercholesterolemia, unspecified: Secondary | ICD-10-CM | POA: Diagnosis not present

## 2013-06-23 DIAGNOSIS — I10 Essential (primary) hypertension: Secondary | ICD-10-CM | POA: Diagnosis not present

## 2013-06-26 DIAGNOSIS — I10 Essential (primary) hypertension: Secondary | ICD-10-CM | POA: Diagnosis not present

## 2013-06-26 DIAGNOSIS — I2510 Atherosclerotic heart disease of native coronary artery without angina pectoris: Secondary | ICD-10-CM | POA: Diagnosis not present

## 2013-06-26 DIAGNOSIS — K219 Gastro-esophageal reflux disease without esophagitis: Secondary | ICD-10-CM | POA: Diagnosis not present

## 2013-06-26 DIAGNOSIS — M47817 Spondylosis without myelopathy or radiculopathy, lumbosacral region: Secondary | ICD-10-CM | POA: Diagnosis not present

## 2013-06-29 DIAGNOSIS — IMO0002 Reserved for concepts with insufficient information to code with codable children: Secondary | ICD-10-CM | POA: Diagnosis not present

## 2013-07-04 DIAGNOSIS — Z800 Family history of malignant neoplasm of digestive organs: Secondary | ICD-10-CM | POA: Diagnosis not present

## 2013-07-04 DIAGNOSIS — K6289 Other specified diseases of anus and rectum: Secondary | ICD-10-CM | POA: Diagnosis not present

## 2013-07-04 DIAGNOSIS — K602 Anal fissure, unspecified: Secondary | ICD-10-CM | POA: Diagnosis not present

## 2013-07-04 DIAGNOSIS — Z86010 Personal history of colonic polyps: Secondary | ICD-10-CM | POA: Diagnosis not present

## 2013-07-04 DIAGNOSIS — K219 Gastro-esophageal reflux disease without esophagitis: Secondary | ICD-10-CM | POA: Diagnosis not present

## 2013-07-21 DIAGNOSIS — IMO0002 Reserved for concepts with insufficient information to code with codable children: Secondary | ICD-10-CM | POA: Diagnosis not present

## 2013-08-08 DIAGNOSIS — IMO0002 Reserved for concepts with insufficient information to code with codable children: Secondary | ICD-10-CM | POA: Diagnosis not present

## 2013-08-22 DIAGNOSIS — IMO0002 Reserved for concepts with insufficient information to code with codable children: Secondary | ICD-10-CM | POA: Diagnosis not present

## 2013-09-25 DIAGNOSIS — I2510 Atherosclerotic heart disease of native coronary artery without angina pectoris: Secondary | ICD-10-CM | POA: Diagnosis not present

## 2013-09-25 DIAGNOSIS — I10 Essential (primary) hypertension: Secondary | ICD-10-CM | POA: Diagnosis not present

## 2013-09-25 DIAGNOSIS — E7800 Pure hypercholesterolemia, unspecified: Secondary | ICD-10-CM | POA: Diagnosis not present

## 2013-11-02 DIAGNOSIS — H2510 Age-related nuclear cataract, unspecified eye: Secondary | ICD-10-CM | POA: Diagnosis not present

## 2013-11-02 DIAGNOSIS — H26019 Infantile and juvenile cortical, lamellar, or zonular cataract, unspecified eye: Secondary | ICD-10-CM | POA: Diagnosis not present

## 2013-11-29 DIAGNOSIS — M899 Disorder of bone, unspecified: Secondary | ICD-10-CM | POA: Diagnosis not present

## 2013-11-29 DIAGNOSIS — Z1231 Encounter for screening mammogram for malignant neoplasm of breast: Secondary | ICD-10-CM | POA: Diagnosis not present

## 2013-11-29 DIAGNOSIS — Z803 Family history of malignant neoplasm of breast: Secondary | ICD-10-CM | POA: Diagnosis not present

## 2013-11-29 DIAGNOSIS — M949 Disorder of cartilage, unspecified: Secondary | ICD-10-CM | POA: Diagnosis not present

## 2013-11-29 DIAGNOSIS — Z8262 Family history of osteoporosis: Secondary | ICD-10-CM | POA: Diagnosis not present

## 2013-12-25 DIAGNOSIS — R0789 Other chest pain: Secondary | ICD-10-CM | POA: Diagnosis not present

## 2013-12-25 DIAGNOSIS — E785 Hyperlipidemia, unspecified: Secondary | ICD-10-CM | POA: Diagnosis not present

## 2013-12-25 DIAGNOSIS — I10 Essential (primary) hypertension: Secondary | ICD-10-CM | POA: Diagnosis not present

## 2013-12-25 DIAGNOSIS — Z8679 Personal history of other diseases of the circulatory system: Secondary | ICD-10-CM | POA: Diagnosis not present

## 2013-12-25 DIAGNOSIS — I2510 Atherosclerotic heart disease of native coronary artery without angina pectoris: Secondary | ICD-10-CM | POA: Diagnosis not present

## 2014-02-13 ENCOUNTER — Other Ambulatory Visit (HOSPITAL_COMMUNITY): Payer: Self-pay | Admitting: Cardiology

## 2014-02-13 DIAGNOSIS — R079 Chest pain, unspecified: Secondary | ICD-10-CM

## 2014-02-13 DIAGNOSIS — M159 Polyosteoarthritis, unspecified: Secondary | ICD-10-CM | POA: Diagnosis not present

## 2014-02-13 DIAGNOSIS — I10 Essential (primary) hypertension: Secondary | ICD-10-CM | POA: Diagnosis not present

## 2014-02-13 DIAGNOSIS — K219 Gastro-esophageal reflux disease without esophagitis: Secondary | ICD-10-CM | POA: Diagnosis not present

## 2014-02-13 DIAGNOSIS — I2510 Atherosclerotic heart disease of native coronary artery without angina pectoris: Secondary | ICD-10-CM | POA: Diagnosis not present

## 2014-02-13 DIAGNOSIS — E785 Hyperlipidemia, unspecified: Secondary | ICD-10-CM | POA: Diagnosis not present

## 2014-02-13 DIAGNOSIS — E559 Vitamin D deficiency, unspecified: Secondary | ICD-10-CM | POA: Diagnosis not present

## 2014-02-13 DIAGNOSIS — R0789 Other chest pain: Secondary | ICD-10-CM | POA: Diagnosis not present

## 2014-02-13 DIAGNOSIS — Z8673 Personal history of transient ischemic attack (TIA), and cerebral infarction without residual deficits: Secondary | ICD-10-CM | POA: Diagnosis not present

## 2014-02-21 ENCOUNTER — Encounter (HOSPITAL_COMMUNITY)
Admission: RE | Admit: 2014-02-21 | Discharge: 2014-02-21 | Disposition: A | Discharge status: Home / Self Care | Payer: Medicare Other | Source: Ambulatory Visit | Attending: Cardiology | Admitting: Cardiology

## 2014-02-21 DIAGNOSIS — R079 Chest pain, unspecified: Secondary | ICD-10-CM | POA: Diagnosis not present

## 2014-02-21 DIAGNOSIS — R0789 Other chest pain: Secondary | ICD-10-CM | POA: Diagnosis not present

## 2014-02-21 MED ORDER — REGADENOSON 0.4 MG/5ML IV SOLN
INTRAVENOUS | Status: AC
  Filled 2014-02-21: qty 5

## 2014-02-21 MED ORDER — TECHNETIUM TC 99M SESTAMIBI GENERIC - CARDIOLITE
10.0000 | Freq: Once | INTRAVENOUS | Status: AC | PRN
  Administered 2014-02-21: 10 via INTRAVENOUS

## 2014-02-21 MED ORDER — REGADENOSON 0.4 MG/5ML IV SOLN
0.4000 mg | Freq: Once | INTRAVENOUS | Status: AC
Start: 2014-02-21 — End: 2014-02-21
  Administered 2014-02-21: 0.4 mg via INTRAVENOUS

## 2014-02-21 MED ORDER — TECHNETIUM TC 99M SESTAMIBI GENERIC - CARDIOLITE
30.0000 | Freq: Once | INTRAVENOUS | Status: AC | PRN
  Administered 2014-02-21: 30 via INTRAVENOUS

## 2014-03-01 DIAGNOSIS — M6740 Ganglion, unspecified site: Secondary | ICD-10-CM | POA: Diagnosis not present

## 2014-07-09 DIAGNOSIS — M1990 Unspecified osteoarthritis, unspecified site: Secondary | ICD-10-CM | POA: Diagnosis not present

## 2014-07-09 DIAGNOSIS — E785 Hyperlipidemia, unspecified: Secondary | ICD-10-CM | POA: Diagnosis not present

## 2014-07-09 DIAGNOSIS — I2510 Atherosclerotic heart disease of native coronary artery without angina pectoris: Secondary | ICD-10-CM | POA: Diagnosis not present

## 2014-07-09 DIAGNOSIS — E559 Vitamin D deficiency, unspecified: Secondary | ICD-10-CM | POA: Diagnosis not present

## 2014-07-09 DIAGNOSIS — I340 Nonrheumatic mitral (valve) insufficiency: Secondary | ICD-10-CM | POA: Diagnosis not present

## 2014-07-09 DIAGNOSIS — I10 Essential (primary) hypertension: Secondary | ICD-10-CM | POA: Diagnosis not present

## 2014-07-09 DIAGNOSIS — I639 Cerebral infarction, unspecified: Secondary | ICD-10-CM | POA: Diagnosis not present

## 2014-07-30 DIAGNOSIS — I10 Essential (primary) hypertension: Secondary | ICD-10-CM | POA: Diagnosis not present

## 2014-07-30 DIAGNOSIS — Z79899 Other long term (current) drug therapy: Secondary | ICD-10-CM | POA: Diagnosis not present

## 2014-08-02 DIAGNOSIS — N390 Urinary tract infection, site not specified: Secondary | ICD-10-CM | POA: Diagnosis not present

## 2014-08-09 DIAGNOSIS — M170 Bilateral primary osteoarthritis of knee: Secondary | ICD-10-CM | POA: Diagnosis not present

## 2014-08-09 DIAGNOSIS — Z96649 Presence of unspecified artificial hip joint: Secondary | ICD-10-CM | POA: Diagnosis not present

## 2014-08-09 DIAGNOSIS — Z471 Aftercare following joint replacement surgery: Secondary | ICD-10-CM | POA: Diagnosis not present

## 2014-08-14 DIAGNOSIS — L219 Seborrheic dermatitis, unspecified: Secondary | ICD-10-CM | POA: Diagnosis not present

## 2014-08-16 DIAGNOSIS — M170 Bilateral primary osteoarthritis of knee: Secondary | ICD-10-CM | POA: Diagnosis not present

## 2014-08-23 DIAGNOSIS — M170 Bilateral primary osteoarthritis of knee: Secondary | ICD-10-CM | POA: Diagnosis not present

## 2014-08-30 DIAGNOSIS — M170 Bilateral primary osteoarthritis of knee: Secondary | ICD-10-CM | POA: Diagnosis not present

## 2014-09-13 DIAGNOSIS — M1712 Unilateral primary osteoarthritis, left knee: Secondary | ICD-10-CM | POA: Diagnosis not present

## 2014-09-13 DIAGNOSIS — M1711 Unilateral primary osteoarthritis, right knee: Secondary | ICD-10-CM | POA: Diagnosis not present

## 2014-10-08 DIAGNOSIS — I2510 Atherosclerotic heart disease of native coronary artery without angina pectoris: Secondary | ICD-10-CM | POA: Diagnosis not present

## 2014-10-08 DIAGNOSIS — M1990 Unspecified osteoarthritis, unspecified site: Secondary | ICD-10-CM | POA: Diagnosis not present

## 2014-10-08 DIAGNOSIS — I639 Cerebral infarction, unspecified: Secondary | ICD-10-CM | POA: Diagnosis not present

## 2014-10-08 DIAGNOSIS — E785 Hyperlipidemia, unspecified: Secondary | ICD-10-CM | POA: Diagnosis not present

## 2014-10-08 DIAGNOSIS — E559 Vitamin D deficiency, unspecified: Secondary | ICD-10-CM | POA: Diagnosis not present

## 2014-10-08 DIAGNOSIS — I10 Essential (primary) hypertension: Secondary | ICD-10-CM | POA: Diagnosis not present

## 2014-10-08 DIAGNOSIS — I340 Nonrheumatic mitral (valve) insufficiency: Secondary | ICD-10-CM | POA: Diagnosis not present

## 2014-10-25 DIAGNOSIS — M170 Bilateral primary osteoarthritis of knee: Secondary | ICD-10-CM | POA: Diagnosis not present

## 2014-10-30 DIAGNOSIS — K5900 Constipation, unspecified: Secondary | ICD-10-CM | POA: Diagnosis not present

## 2014-10-30 DIAGNOSIS — Z1211 Encounter for screening for malignant neoplasm of colon: Secondary | ICD-10-CM | POA: Diagnosis not present

## 2014-10-30 DIAGNOSIS — Z800 Family history of malignant neoplasm of digestive organs: Secondary | ICD-10-CM | POA: Diagnosis not present

## 2014-10-30 DIAGNOSIS — K219 Gastro-esophageal reflux disease without esophagitis: Secondary | ICD-10-CM | POA: Diagnosis not present

## 2014-11-08 DIAGNOSIS — H2513 Age-related nuclear cataract, bilateral: Secondary | ICD-10-CM | POA: Diagnosis not present

## 2014-11-08 DIAGNOSIS — H43392 Other vitreous opacities, left eye: Secondary | ICD-10-CM | POA: Diagnosis not present

## 2014-12-18 DIAGNOSIS — M1990 Unspecified osteoarthritis, unspecified site: Secondary | ICD-10-CM | POA: Diagnosis not present

## 2014-12-18 DIAGNOSIS — E559 Vitamin D deficiency, unspecified: Secondary | ICD-10-CM | POA: Diagnosis not present

## 2014-12-18 DIAGNOSIS — I340 Nonrheumatic mitral (valve) insufficiency: Secondary | ICD-10-CM | POA: Diagnosis not present

## 2014-12-18 DIAGNOSIS — I10 Essential (primary) hypertension: Secondary | ICD-10-CM | POA: Diagnosis not present

## 2014-12-18 DIAGNOSIS — J209 Acute bronchitis, unspecified: Secondary | ICD-10-CM | POA: Diagnosis not present

## 2014-12-18 DIAGNOSIS — E785 Hyperlipidemia, unspecified: Secondary | ICD-10-CM | POA: Diagnosis not present

## 2014-12-18 DIAGNOSIS — J019 Acute sinusitis, unspecified: Secondary | ICD-10-CM | POA: Diagnosis not present

## 2014-12-18 DIAGNOSIS — I2510 Atherosclerotic heart disease of native coronary artery without angina pectoris: Secondary | ICD-10-CM | POA: Diagnosis not present

## 2014-12-26 ENCOUNTER — Encounter (HOSPITAL_COMMUNITY): Payer: Self-pay | Admitting: Emergency Medicine

## 2014-12-26 ENCOUNTER — Emergency Department (HOSPITAL_COMMUNITY)
Admission: EM | Admit: 2014-12-26 | Discharge: 2014-12-26 | Disposition: A | Discharge status: Home / Self Care | Payer: Medicare Other | Attending: Emergency Medicine | Admitting: Emergency Medicine

## 2014-12-26 ENCOUNTER — Emergency Department (HOSPITAL_COMMUNITY): Payer: Medicare Other

## 2014-12-26 DIAGNOSIS — Z862 Personal history of diseases of the blood and blood-forming organs and certain disorders involving the immune mechanism: Secondary | ICD-10-CM | POA: Diagnosis not present

## 2014-12-26 DIAGNOSIS — K219 Gastro-esophageal reflux disease without esophagitis: Secondary | ICD-10-CM | POA: Diagnosis not present

## 2014-12-26 DIAGNOSIS — I10 Essential (primary) hypertension: Secondary | ICD-10-CM | POA: Insufficient documentation

## 2014-12-26 DIAGNOSIS — F419 Anxiety disorder, unspecified: Secondary | ICD-10-CM | POA: Insufficient documentation

## 2014-12-26 DIAGNOSIS — H269 Unspecified cataract: Secondary | ICD-10-CM | POA: Insufficient documentation

## 2014-12-26 DIAGNOSIS — Z791 Long term (current) use of non-steroidal anti-inflammatories (NSAID): Secondary | ICD-10-CM | POA: Insufficient documentation

## 2014-12-26 DIAGNOSIS — Z79899 Other long term (current) drug therapy: Secondary | ICD-10-CM | POA: Insufficient documentation

## 2014-12-26 DIAGNOSIS — E785 Hyperlipidemia, unspecified: Secondary | ICD-10-CM | POA: Insufficient documentation

## 2014-12-26 DIAGNOSIS — J40 Bronchitis, not specified as acute or chronic: Secondary | ICD-10-CM | POA: Diagnosis not present

## 2014-12-26 DIAGNOSIS — Z7982 Long term (current) use of aspirin: Secondary | ICD-10-CM | POA: Diagnosis not present

## 2014-12-26 DIAGNOSIS — M1990 Unspecified osteoarthritis, unspecified site: Secondary | ICD-10-CM | POA: Insufficient documentation

## 2014-12-26 DIAGNOSIS — R0600 Dyspnea, unspecified: Secondary | ICD-10-CM | POA: Diagnosis not present

## 2014-12-26 DIAGNOSIS — R062 Wheezing: Secondary | ICD-10-CM | POA: Diagnosis not present

## 2014-12-26 LAB — BASIC METABOLIC PANEL
Anion gap: 8 (ref 5–15)
BUN: 14 mg/dL (ref 6–20)
CO2: 28 mmol/L (ref 22–32)
CREATININE: 1.22 mg/dL — AB (ref 0.44–1.00)
Calcium: 9 mg/dL (ref 8.9–10.3)
Chloride: 101 mmol/L (ref 101–111)
GFR calc non Af Amer: 40 mL/min — ABNORMAL LOW (ref >60)
GFR, EST AFRICAN AMERICAN: 46 mL/min — AB (ref >60)
Glucose, Bld: 129 mg/dL — ABNORMAL HIGH (ref 65–99)
Potassium: 3.4 mmol/L — ABNORMAL LOW (ref 3.5–5.1)
SODIUM: 137 mmol/L (ref 135–145)

## 2014-12-26 LAB — CBC
HEMATOCRIT: 35.5 % — AB (ref 36.0–46.0)
HEMOGLOBIN: 11.6 g/dL — AB (ref 12.0–15.0)
MCH: 29.4 pg (ref 26.0–34.0)
MCHC: 32.7 g/dL (ref 30.0–36.0)
MCV: 90.1 fL (ref 78.0–100.0)
Platelets: 197 10*3/uL (ref 150–400)
RBC: 3.94 MIL/uL (ref 3.87–5.11)
RDW: 13.5 % (ref 11.5–15.5)
WBC: 5.3 10*3/uL (ref 4.0–10.5)

## 2014-12-26 LAB — I-STAT TROPONIN, ED: Troponin i, poc: 0 ng/mL (ref 0.00–0.08)

## 2014-12-26 LAB — BRAIN NATRIURETIC PEPTIDE: B Natriuretic Peptide: 24.6 pg/mL (ref 0.0–100.0)

## 2014-12-26 MED ORDER — PREDNISONE 10 MG PO TABS: 20.0000 mg | ORAL_TABLET | Freq: Every day | ORAL | Status: DC

## 2014-12-26 MED ORDER — AEROCHAMBER PLUS W/MASK MISC: Status: DC

## 2014-12-26 MED ORDER — ALBUTEROL SULFATE HFA 108 (90 BASE) MCG/ACT IN AERS: 2.0000 | INHALATION_SPRAY | RESPIRATORY_TRACT | Status: DC | PRN

## 2014-12-26 MED ORDER — IPRATROPIUM-ALBUTEROL 0.5-2.5 (3) MG/3ML IN SOLN
3.0000 mL | Freq: Once | RESPIRATORY_TRACT | Status: AC
  Administered 2014-12-26: 3 mL via RESPIRATORY_TRACT
  Filled 2014-12-26: qty 3

## 2014-12-26 NOTE — ED Notes (Signed)
To ED via private vehicle from home -- Called Dr. Terrence Dupont today and was told to come here-- seen by Dr. Terrence Dupont last week for bronchitis-- was given antibiotic-- pt c/o wheezing and shortness of breath with exertion, pain with inspiration when laying down. Pt is alert/oriented x 4.

## 2014-12-26 NOTE — ED Provider Notes (Signed)
CSN: [id number]     Arrival date & time 12/26/14  1552 History   First MD Initiated Contact with Patient 12/26/14 1623     Chief Complaint  Patient presents with  . Wheezing  . URI  . Shortness of Breath     (Consider location/radiation/quality/duration/timing/severity/associated sxs/prior Treatment) HPI 2 weeks of cough and wheeze. Patient is finishing a Levaquin prescription tomorrow. She has not seen any improvement. Patient reports she is intermittently noting pain with inspiration when supine.  Past Medical History  Diagnosis Date  . Dysrhythmia   . Anxiety   . Bronchitis     hx of  . GERD (gastroesophageal reflux disease)   . Headache(784.0)     hx of migraines  . Neuromuscular disorder     hx of carpal tunnel "never had surgery for"  . Arthritis   . Anemia     "in past as a young girl"  . Cataract     bilaterally, "no surgery at this time"  . Hyperlipidemia   . Hypertension     sees Dr. Terrence Dupont   Past Surgical History  Procedure Laterality Date  . Cardiac catheterization    . Doppler echocardiography      hx of  . Neck surgery      2002, cyst removed,   . Tubal ligation      1970  . Total hip arthroplasty Right 08/08/2012    Dr Mayer Camel  . Total hip arthroplasty Right 08/08/2012    Procedure: TOTAL HIP ARTHROPLASTY;  Surgeon: Kerin Salen, MD;  Location: [facility];  Service: Orthopedics;  Laterality: Right;   No family history on file. History  Substance Use Topics  . Smoking status: Never Smoker   . Smokeless tobacco: Never Used  . Alcohol Use: No   OB History    No data available     Review of Systems 10 Systems reviewed and are negative for acute change except as noted in the HPI.   Allergies  Codeine  Home Medications   Prior to Admission medications   Medication Sig Start Date End Date Taking? Authorizing Provider  albuterol (PROVENTIL HFA;VENTOLIN HFA) 108 (90 BASE) MCG/ACT inhaler Inhale 2 puffs into the lungs every 2 (two) hours as needed  for wheezing or shortness of breath (cough). 12/26/14   Charlesetta Shanks, MD  aspirin 325 MG EC tablet Take 1 tablet (325 mg total) by mouth 2 (two) times daily. 08/25/12   Monina C Medina-Vargas, NP  aspirin EC 81 MG tablet Take 81 mg by mouth daily.   Yes Historical Provider, MD  atorvastatin (LIPITOR) 40 MG tablet Take 1 tablet (40 mg total) by mouth every evening. 08/25/12   Monina C Medina-Vargas, NP  bisacodyl (DULCOLAX) 5 MG EC tablet Take 1 tablet (5 mg total) by mouth daily as needed. 08/25/12   Monina C Medina-Vargas, NP  Calcium Carbonate-Vitamin D (CALCIUM-VITAMIN D3) 600-125 MG-UNIT TABS Take 2 tablets by mouth daily. 08/25/12  Yes Monina C Medina-Vargas, NP  Cyanocobalamin (B-12 PO) Take 1 tablet by mouth as needed (for energy).   Yes Historical Provider, MD  docusate sodium (COLACE) 100 MG capsule Take 2 capsules (200 mg total) by mouth daily as needed for constipation. For constipation 08/25/12   Monina C Medina-Vargas, NP  esomeprazole (NEXIUM) 20 MG capsule Take 20 mg by mouth as needed (for heartburn).   Yes Historical Provider, MD  ibuprofen (ADVIL,MOTRIN) 200 MG tablet Take 200 mg by mouth every morning.   Yes Historical Provider, MD  levofloxacin (LEVAQUIN) 500 MG tablet Take 500 mg by mouth daily. 12/18/14  Yes Historical Provider, MD  LORazepam (ATIVAN) 1 MG tablet Take 1 mg by mouth every morning.   Yes Historical Provider, MD  losartan (COZAAR) 50 MG tablet Take 1 tablet (50 mg total) by mouth every morning. 08/25/12  Yes Monina C Medina-Vargas, NP  meclizine (ANTIVERT) 25 MG tablet Take 0.5 tablets (12.5 mg total) by mouth every morning. 08/25/12  Yes Monina C Medina-Vargas, NP  Multiple Vitamins-Minerals (ECHINACEA ACZ PO) Take 1 capsule by mouth as needed (for immune support).   Yes Historical Provider, MD  nebivolol (BYSTOLIC) 5 MG tablet Take 1 tablet (5 mg total) by mouth daily. 08/25/12  Yes Monina C Medina-Vargas, NP  omeprazole (PRILOSEC) 20 MG capsule Take 1 capsule (20 mg  total) by mouth daily as needed. For heartburn 08/25/12   Monina C Medina-Vargas, NP  oxyCODONE-acetaminophen (ROXICET) 5-325 MG per tablet Take 1-2 tablets by mouth every 4 (four) hours as needed for pain. 08/25/12   Monina C Medina-Vargas, NP  predniSONE (DELTASONE) 10 MG tablet Take 2 tablets (20 mg total) by mouth daily. 12/26/14   Charlesetta Shanks, MD  rosuvastatin (CRESTOR) 5 MG tablet Take 5 mg by mouth daily.   Yes Historical Provider, MD  SALINE NASAL SPRAY NA Place 1 puff into the nose at bedtime.   Yes Historical Provider, MD  Spacer/Aero-Holding Chambers (AEROCHAMBER PLUS WITH MASK) inhaler Use as instructed 12/26/14   Charlesetta Shanks, MD  triamterene-hydrochlorothiazide (MAXZIDE-25) 37.5-25 MG per tablet Take 1 each (1 tablet total) by mouth every morning. 08/25/12  Yes Monina C Medina-Vargas, NP   BP 117/54 mmHg  Pulse 83  Temp(Src) 97.9 F (36.6 C) (Oral)  Resp 20  SpO2 96% Physical Exam  Constitutional: She is oriented to person, place, and time. She appears well-developed and well-nourished.  HENT:  Head: Normocephalic and atraumatic.  Eyes: EOM are normal. Pupils are equal, round, and reactive to light.  Neck: Neck supple.  Cardiovascular: Normal rate, regular rhythm, normal heart sounds and intact distal pulses.   Pulmonary/Chest: Effort normal. No respiratory distress.  Occasional fine Wheeze. Good air flow. No rhonchi.  Abdominal: Soft. Bowel sounds are normal. She exhibits no distension. There is no tenderness.  Musculoskeletal: Normal range of motion. She exhibits no edema.  Neurological: She is alert and oriented to person, place, and time. She has normal strength. Coordination normal. GCS eye subscore is 4. GCS verbal subscore is 5. GCS motor subscore is 6.  Skin: Skin is warm, dry and intact.  Psychiatric: She has a normal mood and affect.    ED Course  Procedures (including critical care time) Labs Review Labs Reviewed  BASIC METABOLIC PANEL - Abnormal; Notable  for the following:    Potassium 3.4 (*)    Glucose, Bld 129 (*)    Creatinine, Ser 1.22 (*)    GFR calc non Af Amer 40 (*)    GFR calc Af Amer 46 (*)    All other components within normal limits  CBC - Abnormal; Notable for the following:    Hemoglobin 11.6 (*)    HCT 35.5 (*)    All other components within normal limits  BRAIN NATRIURETIC PEPTIDE  I-STAT TROPOININ, ED    Imaging Review No results found.    MDM   Final diagnoses:  Bronchitis   This diagnostic workup does not indicate acute ischemic disease. Chest x-ray does not show pneumonia or vascular overload. Findings are consistent with bronchitis.  Patient will be given prednisone and inhaler with spacer.    Charlesetta Shanks, MD 12/31/14 [phone]

## 2014-12-26 NOTE — Discharge Instructions (Signed)

## 2015-01-07 DIAGNOSIS — I10 Essential (primary) hypertension: Secondary | ICD-10-CM | POA: Diagnosis not present

## 2015-01-07 DIAGNOSIS — E785 Hyperlipidemia, unspecified: Secondary | ICD-10-CM | POA: Diagnosis not present

## 2015-01-07 DIAGNOSIS — E559 Vitamin D deficiency, unspecified: Secondary | ICD-10-CM | POA: Diagnosis not present

## 2015-01-07 DIAGNOSIS — M1990 Unspecified osteoarthritis, unspecified site: Secondary | ICD-10-CM | POA: Diagnosis not present

## 2015-01-07 DIAGNOSIS — I340 Nonrheumatic mitral (valve) insufficiency: Secondary | ICD-10-CM | POA: Diagnosis not present

## 2015-01-07 DIAGNOSIS — E039 Hypothyroidism, unspecified: Secondary | ICD-10-CM | POA: Diagnosis not present

## 2015-01-07 DIAGNOSIS — I2510 Atherosclerotic heart disease of native coronary artery without angina pectoris: Secondary | ICD-10-CM | POA: Diagnosis not present

## 2015-01-07 DIAGNOSIS — I639 Cerebral infarction, unspecified: Secondary | ICD-10-CM | POA: Diagnosis not present

## 2015-01-10 DIAGNOSIS — Z1231 Encounter for screening mammogram for malignant neoplasm of breast: Secondary | ICD-10-CM | POA: Diagnosis not present

## 2015-01-10 DIAGNOSIS — Z803 Family history of malignant neoplasm of breast: Secondary | ICD-10-CM | POA: Diagnosis not present

## 2015-01-21 DIAGNOSIS — K635 Polyp of colon: Secondary | ICD-10-CM | POA: Diagnosis not present

## 2015-01-21 DIAGNOSIS — Z800 Family history of malignant neoplasm of digestive organs: Secondary | ICD-10-CM | POA: Diagnosis not present

## 2015-01-21 DIAGNOSIS — D120 Benign neoplasm of cecum: Secondary | ICD-10-CM | POA: Diagnosis not present

## 2015-01-21 DIAGNOSIS — Z1211 Encounter for screening for malignant neoplasm of colon: Secondary | ICD-10-CM | POA: Diagnosis not present

## 2015-01-21 DIAGNOSIS — Z86010 Personal history of colonic polyps: Secondary | ICD-10-CM | POA: Diagnosis not present

## 2015-02-05 DIAGNOSIS — J441 Chronic obstructive pulmonary disease with (acute) exacerbation: Secondary | ICD-10-CM | POA: Diagnosis not present

## 2015-02-05 DIAGNOSIS — R062 Wheezing: Secondary | ICD-10-CM | POA: Diagnosis not present

## 2015-02-05 DIAGNOSIS — R05 Cough: Secondary | ICD-10-CM | POA: Diagnosis not present

## 2015-02-16 DIAGNOSIS — R059 Cough, unspecified: Secondary | ICD-10-CM | POA: Insufficient documentation

## 2015-02-16 DIAGNOSIS — R05 Cough: Secondary | ICD-10-CM | POA: Insufficient documentation

## 2015-02-20 DIAGNOSIS — E559 Vitamin D deficiency, unspecified: Secondary | ICD-10-CM | POA: Diagnosis not present

## 2015-02-20 DIAGNOSIS — I10 Essential (primary) hypertension: Secondary | ICD-10-CM | POA: Diagnosis not present

## 2015-02-20 DIAGNOSIS — M1990 Unspecified osteoarthritis, unspecified site: Secondary | ICD-10-CM | POA: Diagnosis not present

## 2015-02-20 DIAGNOSIS — I340 Nonrheumatic mitral (valve) insufficiency: Secondary | ICD-10-CM | POA: Diagnosis not present

## 2015-02-20 DIAGNOSIS — E785 Hyperlipidemia, unspecified: Secondary | ICD-10-CM | POA: Diagnosis not present

## 2015-02-20 DIAGNOSIS — I2510 Atherosclerotic heart disease of native coronary artery without angina pectoris: Secondary | ICD-10-CM | POA: Diagnosis not present

## 2015-02-20 DIAGNOSIS — I639 Cerebral infarction, unspecified: Secondary | ICD-10-CM | POA: Diagnosis not present

## 2015-02-20 DIAGNOSIS — J209 Acute bronchitis, unspecified: Secondary | ICD-10-CM | POA: Diagnosis not present

## 2015-02-26 ENCOUNTER — Emergency Department (HOSPITAL_COMMUNITY): Payer: Medicare Other

## 2015-02-26 ENCOUNTER — Emergency Department (HOSPITAL_COMMUNITY)
Admission: EM | Admit: 2015-02-26 | Discharge: 2015-02-26 | Disposition: A | Discharge status: Home / Self Care | Payer: Medicare Other | Attending: Emergency Medicine | Admitting: Emergency Medicine

## 2015-02-26 ENCOUNTER — Encounter (HOSPITAL_COMMUNITY): Payer: Self-pay

## 2015-02-26 DIAGNOSIS — M1990 Unspecified osteoarthritis, unspecified site: Secondary | ICD-10-CM | POA: Insufficient documentation

## 2015-02-26 DIAGNOSIS — Z8669 Personal history of other diseases of the nervous system and sense organs: Secondary | ICD-10-CM | POA: Insufficient documentation

## 2015-02-26 DIAGNOSIS — Z7982 Long term (current) use of aspirin: Secondary | ICD-10-CM | POA: Diagnosis not present

## 2015-02-26 DIAGNOSIS — R05 Cough: Secondary | ICD-10-CM

## 2015-02-26 DIAGNOSIS — H269 Unspecified cataract: Secondary | ICD-10-CM | POA: Insufficient documentation

## 2015-02-26 DIAGNOSIS — K219 Gastro-esophageal reflux disease without esophagitis: Secondary | ICD-10-CM | POA: Insufficient documentation

## 2015-02-26 DIAGNOSIS — R062 Wheezing: Secondary | ICD-10-CM | POA: Diagnosis not present

## 2015-02-26 DIAGNOSIS — J40 Bronchitis, not specified as acute or chronic: Secondary | ICD-10-CM | POA: Insufficient documentation

## 2015-02-26 DIAGNOSIS — F419 Anxiety disorder, unspecified: Secondary | ICD-10-CM | POA: Insufficient documentation

## 2015-02-26 DIAGNOSIS — Z862 Personal history of diseases of the blood and blood-forming organs and certain disorders involving the immune mechanism: Secondary | ICD-10-CM | POA: Diagnosis not present

## 2015-02-26 DIAGNOSIS — E785 Hyperlipidemia, unspecified: Secondary | ICD-10-CM | POA: Insufficient documentation

## 2015-02-26 DIAGNOSIS — Z79899 Other long term (current) drug therapy: Secondary | ICD-10-CM | POA: Insufficient documentation

## 2015-02-26 DIAGNOSIS — I10 Essential (primary) hypertension: Secondary | ICD-10-CM | POA: Insufficient documentation

## 2015-02-26 DIAGNOSIS — R059 Cough, unspecified: Secondary | ICD-10-CM

## 2015-02-26 DIAGNOSIS — R0602 Shortness of breath: Secondary | ICD-10-CM | POA: Diagnosis not present

## 2015-02-26 LAB — CBC
HEMATOCRIT: 39.4 % (ref 36.0–46.0)
HEMOGLOBIN: 12.8 g/dL (ref 12.0–15.0)
MCH: 29.7 pg (ref 26.0–34.0)
MCHC: 32.5 g/dL (ref 30.0–36.0)
MCV: 91.4 fL (ref 78.0–100.0)
Platelets: 214 10*3/uL (ref 150–400)
RBC: 4.31 MIL/uL (ref 3.87–5.11)
RDW: 13.4 % (ref 11.5–15.5)
WBC: 5.3 10*3/uL (ref 4.0–10.5)

## 2015-02-26 LAB — BASIC METABOLIC PANEL
ANION GAP: 9 (ref 5–15)
BUN: 10 mg/dL (ref 6–20)
CHLORIDE: 99 mmol/L — AB (ref 101–111)
CO2: 29 mmol/L (ref 22–32)
Calcium: 9.8 mg/dL (ref 8.9–10.3)
Creatinine, Ser: 1.03 mg/dL — ABNORMAL HIGH (ref 0.44–1.00)
GFR, EST AFRICAN AMERICAN: 57 mL/min — AB (ref >60)
GFR, EST NON AFRICAN AMERICAN: 49 mL/min — AB (ref >60)
Glucose, Bld: 145 mg/dL — ABNORMAL HIGH (ref 65–99)
POTASSIUM: 3.2 mmol/L — AB (ref 3.5–5.1)
SODIUM: 137 mmol/L (ref 135–145)

## 2015-02-26 LAB — TROPONIN I

## 2015-02-26 MED ORDER — ALBUTEROL SULFATE (2.5 MG/3ML) 0.083% IN NEBU
5.0000 mg | INHALATION_SOLUTION | Freq: Once | RESPIRATORY_TRACT | Status: AC
  Administered 2015-02-26: 5 mg via RESPIRATORY_TRACT

## 2015-02-26 MED ORDER — HYDROCOD POLST-CPM POLST ER 10-8 MG/5ML PO SUER: 5.0000 mL | Freq: Two times a day (BID) | ORAL | Status: DC | PRN

## 2015-02-26 MED ORDER — PREDNISONE 20 MG PO TABS: 60.0000 mg | ORAL_TABLET | Freq: Every day | ORAL | Status: DC

## 2015-02-26 MED ORDER — ONDANSETRON 4 MG PO TBDP: ORAL_TABLET | ORAL | Status: DC

## 2015-02-26 MED ORDER — ALBUTEROL SULFATE (2.5 MG/3ML) 0.083% IN NEBU
INHALATION_SOLUTION | RESPIRATORY_TRACT | Status: AC
  Administered 2015-02-26: 13:00:00
  Filled 2015-02-26: qty 6

## 2015-02-26 NOTE — Discharge Instructions (Signed)
Chronic Asthmatic Bronchitis Chronic asthmatic bronchitis is a complication of persistent asthma. After a period of time with asthma, some people develop airflow obstruction that is present all the time, even when not having an asthma attack.There is also persistent inflammation of the airways, and the bronchial tubes produce more mucus. Chronic asthmatic bronchitis usually is a permanent problem with the lungs. CAUSES  Chronic asthmatic bronchitis happens most often in people who have asthma and also smoke cigarettes. Occasionally, it can happen to a person with long-standing or severe asthma even if the person is not a smoker. SIGNS AND SYMPTOMS  Chronic asthmatic bronchitis usually causes symptoms of both asthma and chronic bronchitis, including:   Coughing.  Increased sputum production.  Wheezing and shortness of breath.  Chest discomfort.  Recurring infections. DIAGNOSIS  Your health care provider will take a medical history and perform a physical exam. Chronic asthmatic bronchitis is suspected when a person with asthma has abnormal results on breathing tests (pulmonary function tests) even when breathing symptoms are at their best. Other tests, such as a chest X-ray, may be performed to rule out other conditions.  TREATMENT  Treatment involves controlling symptoms with medicine and lifestyle changes.  Your health care provider may prescribe asthma medicines, including inhaler and nebulizer medicines.  Infection can be treated with medicine to kill germs (antibiotics). Serious infections may require hospitalization. These can include:  Pneumonia.  Sinus infections.  Acute bronchitis.   Preventing infection and hospitalization is very important. Get an influenza vaccination every year as directed by your health care provider. Ask your health care provider whether you need a pneumonia vaccine.  Ask your health care provider whether you would benefit from a pulmonary  rehabilitation program. HOME CARE INSTRUCTIONS  Take medicines only as directed by your health care provider.  If you are a cigarette smoker, the most important thing that you can do is quit. Talk to your health care provider for help with quitting smoking.  Avoid pollen, dust, animal dander, molds, smoke, and other things that cause attacks.  Regular exercise is very important to help you feel better. Discuss possible exercise routines with your health care provider.  If animal dander is the cause of asthma, you may not be able to keep pets.  It is important that you:  Become educated about your medical condition.  Participate in maintaining wellness.  Seek medical care as directed. Delay in seeking medical care could cause permanent injury and may be a risk to your life. SEEK MEDICAL CARE IF:  You have wheezing and shortness of breath even if taking medicine to prevent attacks.  You have muscle aches, chest pain, or thickening of sputum.  Your sputum changes from clear or white to yellow, green, gray, or bloody. SEEK IMMEDIATE MEDICAL CARE IF:  Your usual medicines do not stop your wheezing.  You have increased coughing or shortness of breath or both.  You have increased difficulty breathing.  You have any problems from the medicine you are taking, such as a rash, itching, swelling, or trouble breathing. MAKE SURE YOU:   Understand these instructions.  Will watch your condition.  Will get help right away if you are not doing well or get worse. Document Released: 03/12/2006 Document Revised: 10/09/2013 Document Reviewed: 07/03/2013 Exit[employer] Patient Information 2015 [employer]. This information is not intended to replace advice given to you by your health care provider. Make sure you discuss any questions you have with your health care provider.  

## 2015-02-26 NOTE — ED Provider Notes (Addendum)
CSN: [id number]     Arrival date & time 02/26/15  1229 History   First MD Initiated Contact with Patient 02/26/15 1629     Chief Complaint  Patient presents with  . Cough  . Shortness of Breath     (Consider location/radiation/quality/duration/timing/severity/associated sxs/prior Treatment) Patient is a 81 y.o. female presenting with cough.  Cough Cough characteristics:  Non-productive Severity:  Moderate Onset quality:  Gradual Duration:  8 weeks Timing:  Constant Progression:  Unchanged Chronicity:  New Smoker: no   Context comment:  Seen by PCP x 2, here x 1, nonresponsive to levaquin and prednisone courses Relieved by:  Nothing Worsened by:  Nothing tried Ineffective treatments:  None tried Associated symptoms: chest pain (only with cough), chills and wheezing   Associated symptoms: no fever, no rash, no shortness of breath and no sinus congestion     Past Medical History  Diagnosis Date  . Dysrhythmia   . Anxiety   . Bronchitis     hx of  . GERD (gastroesophageal reflux disease)   . Headache(784.0)     hx of migraines  . Neuromuscular disorder     hx of carpal tunnel "never had surgery for"  . Arthritis   . Anemia     "in past as a young girl"  . Cataract     bilaterally, "no surgery at this time"  . Hyperlipidemia   . Hypertension     sees Dr. Terrence Dupont   Past Surgical History  Procedure Laterality Date  . Cardiac catheterization    . Doppler echocardiography      hx of  . Neck surgery      2002, cyst removed,   . Tubal ligation      1970  . Total hip arthroplasty Right 08/08/2012    Dr Mayer Camel  . Total hip arthroplasty Right 08/08/2012    Procedure: TOTAL HIP ARTHROPLASTY;  Surgeon: Kerin Salen, MD;  Location: [facility];  Service: Orthopedics;  Laterality: Right;   History reviewed. No pertinent family history. Social History  Substance Use Topics  . Smoking status: Never Smoker   . Smokeless tobacco: Never Used  . Alcohol Use: No   OB History     No data available     Review of Systems  Constitutional: Positive for chills. Negative for fever.  Respiratory: Positive for cough and wheezing. Negative for shortness of breath.   Cardiovascular: Positive for chest pain (only with cough).  Skin: Negative for rash.  All other systems reviewed and are negative.     Allergies  Codeine  Home Medications   Prior to Admission medications   Medication Sig Start Date End Date Taking? Authorizing Provider  albuterol (PROVENTIL HFA;VENTOLIN HFA) 108 (90 BASE) MCG/ACT inhaler Inhale 2 puffs into the lungs every 2 (two) hours as needed for wheezing or shortness of breath (cough). 12/26/14  Yes Charlesetta Shanks, MD  aspirin EC 81 MG tablet Take 81 mg by mouth daily.   Yes Historical Provider, MD  atorvastatin (LIPITOR) 40 MG tablet Take 1 tablet (40 mg total) by mouth every evening. 08/25/12  Yes Monina C Medina-Vargas, NP  budesonide-formoterol (SYMBICORT) 160-4.5 MCG/ACT inhaler Inhale 2 puffs into the lungs 2 (two) times daily.   Yes Historical Provider, MD  Calcium Carbonate-Vitamin D (CALCIUM-VITAMIN D3) 600-125 MG-UNIT TABS Take 2 tablets by mouth daily. 08/25/12  Yes Monina C Medina-Vargas, NP  Cyanocobalamin (B-12 PO) Take 1 tablet by mouth as needed (for energy).   Yes Historical Provider, MD  docusate sodium (COLACE) 100 MG capsule Take 2 capsules (200 mg total) by mouth daily as needed for constipation. For constipation 08/25/12  Yes Monina C Medina-Vargas, NP  esomeprazole (NEXIUM) 20 MG capsule Take 20 mg by mouth as needed (for heartburn).   Yes Historical Provider, MD  ibuprofen (ADVIL,MOTRIN) 200 MG tablet Take 200 mg by mouth every morning.   Yes Historical Provider, MD  levofloxacin (LEVAQUIN) 500 MG tablet Take 500 mg by mouth daily. 12/18/14  Yes Historical Provider, MD  LORazepam (ATIVAN) 1 MG tablet Take 1 mg by mouth every morning.   Yes Historical Provider, MD  losartan (COZAAR) 50 MG tablet Take 1 tablet (50 mg total) by mouth  every morning. Patient taking differently: Take 100 mg by mouth every morning.  08/25/12  Yes Monina C Medina-Vargas, NP  meclizine (ANTIVERT) 25 MG tablet Take 0.5 tablets (12.5 mg total) by mouth every morning. 08/25/12  Yes Monina C Medina-Vargas, NP  Multiple Vitamins-Minerals (ECHINACEA ACZ PO) Take 1 capsule by mouth as needed (for immune support).   Yes Historical Provider, MD  nebivolol (BYSTOLIC) 5 MG tablet Take 1 tablet (5 mg total) by mouth daily. 08/25/12  Yes Monina C Medina-Vargas, NP  SALINE NASAL SPRAY NA Place 1 puff into the nose at bedtime.   Yes Historical Provider, MD  Spacer/Aero-Holding Chambers (AEROCHAMBER PLUS WITH MASK) inhaler Use as instructed 12/26/14  Yes Charlesetta Shanks, MD  triamterene-hydrochlorothiazide (MAXZIDE-25) 37.5-25 MG per tablet Take 1 each (1 tablet total) by mouth every morning. 08/25/12  Yes Monina C Medina-Vargas, NP  chlorpheniramine-HYDROcodone (TUSSIONEX PENNKINETIC ER) 10-8 MG/5ML SUER Take 5 mLs by mouth every 12 (twelve) hours as needed for cough. 02/26/15   Debby Freiberg, MD  ondansetron (ZOFRAN ODT) 4 MG disintegrating tablet 4mg  ODT q4 hours prn nausea/vomit 02/26/15   Debby Freiberg, MD  predniSONE (DELTASONE) 20 MG tablet Take 3 tablets (60 mg total) by mouth daily. 02/26/15   Debby Freiberg, MD   BP 149/79 mmHg  Pulse 80  Temp(Src) 98.2 F (36.8 C) (Oral)  Resp 19  Ht 5\' 3"  (1.6 m)  Wt 183 lb 6.4 oz (83.19 kg)  BMI 32.50 kg/m2  SpO2 99% Physical Exam  Constitutional: She is oriented to person, place, and time. She appears well-developed and well-nourished.  HENT:  Head: Normocephalic and atraumatic.  Right Ear: External ear normal.  Left Ear: External ear normal.  Eyes: Conjunctivae and EOM are normal. Pupils are equal, round, and reactive to light.  Neck: Normal range of motion. Neck supple.  Cardiovascular: Normal rate, regular rhythm, normal heart sounds and intact distal pulses.   Pulmonary/Chest: Effort normal. She has wheezes  (diffuse). She has no rales.  Abdominal: Soft. Bowel sounds are normal. There is no tenderness.  Musculoskeletal: Normal range of motion.  Neurological: She is alert and oriented to person, place, and time.  Skin: Skin is warm and dry.  Vitals reviewed.   ED Course  Procedures (including critical care time) Labs Review Labs Reviewed  BASIC METABOLIC PANEL - Abnormal; Notable for the following:    Potassium 3.2 (*)    Chloride 99 (*)    Glucose, Bld 145 (*)    Creatinine, Ser 1.03 (*)    GFR calc non Af Amer 49 (*)    GFR calc Af Amer 57 (*)    All other components within normal limits  TROPONIN I  CBC    Imaging Review Dg Chest 2 View  02/26/2015   CLINICAL DATA:  Cough, congestion,  and shortness of breath since 12/2014.  EXAM: CHEST  2 VIEW  COMPARISON:  12/26/2014  FINDINGS: The patient is slightly rotated to the right. Cardiac silhouette is upper limits of normal in size. Thoracic aorta is mildly tortuous. The lungs are normally to slightly hyperinflated and clear, unchanged. No acute osseous abnormality is identified.  IMPRESSION: No active cardiopulmonary disease.   Electronically Signed   By: Logan Bores M.D.   On: 02/26/2015 13:37   I have personally reviewed and evaluated these images and lab results as part of my medical decision-making.   EKG Interpretation   Date/Time:  Tuesday February 26 2015 12:42:26 EDT Ventricular Rate:  90 PR Interval:  168 QRS Duration: 88 QT Interval:  366 QTC Calculation: 447 R Axis:   -48 Text Interpretation:  Sinus rhythm with Premature supraventricular  complexes Left anterior fascicular block Moderate voltage criteria for  LVH, may be normal variant Abnormal ECG No significant change since last  tracing Confirmed by Debby Freiberg [phone]) on 02/26/2015 4:45:50 PM      MDM   Final diagnoses:  Cough  Bronchitis    81 y.o. female with pertinent PMH of HTN, HLD, anemia presents with continued cough 2 months. The recorded  fevers, no chest pain outside of pain with active coughing. Also no nausea or vomiting with the exception of occasional posttussive nausea.  She's been seen 3 prior to my examination and prescribed Levaquin on 2 occasions as well as prednisone.  On arrival today vitals signs and physical exam as above.  Patient is well-appearing has diffuse wheezing. She denies current shortness of breath. Workup as above unremarkable for acute pathology. I discussed the patient's condition at length and encouraged strict follow-up, which she has with pulmonology in one week. Considered cardiac etiology as well as pulmonary, however pt has no edema of lower extremities, jvd, or rales, and has a negative trop without new symptoms in 6 hours.  Discharged home in stable condition with prednisone, tussionex, and zofran (prior reaction to codeine).    I have reviewed all laboratory and imaging studies if ordered as above  1. Cough   2. Bronchitis         Debby Freiberg, MD 02/26/15 1728  Debby Freiberg, MD 02/26/15 [phone]

## 2015-02-26 NOTE — ED Notes (Signed)
Onset 7-20 pt was diagnosed with bronchitis.  Has taken several rounds of antibiotics, steroids, robitussin and inhaler with no reilef. Pt is not able to sleep or do usual activities.  Talking in complete sentences, no respiratory distress noted at triage.  Lungs exp wheezing.

## 2015-03-06 ENCOUNTER — Ambulatory Visit (INDEPENDENT_AMBULATORY_CARE_PROVIDER_SITE_OTHER): Payer: Medicare Other | Admitting: Allergy and Immunology

## 2015-03-06 ENCOUNTER — Encounter: Payer: Self-pay | Admitting: Allergy and Immunology

## 2015-03-06 VITALS — BP 112/68 | HR 76 | Temp 98.2°F | Resp 16

## 2015-03-06 DIAGNOSIS — J45901 Unspecified asthma with (acute) exacerbation: Secondary | ICD-10-CM | POA: Diagnosis not present

## 2015-03-06 DIAGNOSIS — J4541 Moderate persistent asthma with (acute) exacerbation: Secondary | ICD-10-CM

## 2015-03-06 DIAGNOSIS — R61 Generalized hyperhidrosis: Secondary | ICD-10-CM | POA: Diagnosis not present

## 2015-03-06 MED ORDER — FLUTICASONE FUROATE-VILANTEROL 200-25 MCG/INH IN AEPB: 1.0000 | INHALATION_SPRAY | Freq: Every day | RESPIRATORY_TRACT | Status: DC

## 2015-03-06 MED ORDER — FLUTICASONE FUROATE-VILANTEROL 200-25 MCG/INH IN AEPB: 1.0000 | INHALATION_SPRAY | Freq: Once | RESPIRATORY_TRACT | Status: DC

## 2015-03-06 NOTE — Addendum Note (Signed)
Addended by: Christian Mate on: 03/06/2015 02:32 PM   Modules accepted: Orders

## 2015-03-06 NOTE — Patient Instructions (Signed)
Assessment and plan:     Problem List Items Addressed This Visit      Respiratory   Intrinsic asthma with exacerbation     A sample and prescription have been provided for Dulera 200/5 g, 2 inhalations via spacer device twice a day.  Discontinue Symbicort.  The patient has been asked to contact me if her symptoms persist, progress, or if she becomes febrile. Otherwise, she may return for follow up in 2 months.      Relevant Medications   albuterol (VENTOLIN HFA) 108 (90 BASE) MCG/ACT inhaler   Fluticasone Furoate-Vilanterol (BREO ELLIPTA) 200-25 MCG/INH AEPB     Other   Unexplained night sweats    Paula Pollard has been asked to follow up with PCP regarding this issue. She has verbalized understanding and has agreed to do so.

## 2015-03-06 NOTE — Assessment & Plan Note (Signed)
Mora has been asked to follow up with PCP regarding this issue. She has verbalized understanding and has agreed to do so.

## 2015-03-06 NOTE — Assessment & Plan Note (Addendum)
   A sample and prescription have been provided for [facility] 200/5 g, 2 inhalations via spacer device twice a day.  Discontinue Symbicort.  The patient has been asked to contact me if her symptoms persist, progress, or if she becomes febrile. Otherwise, she may return for follow up in 2 months.

## 2015-03-06 NOTE — Progress Notes (Addendum)
History of present illness: Problem  Intrinsic Asthma With Exacerbation   Recently, her asthma has been poorly controlled. she has been compliant with her prescribed asthma medications. she has been experiencing frequent marked dyspnea, productive cough and wheezing. On average, she has experienced asthma symptoms multiple times per day and has been awakened from sleep by coughing and dyspnea. Patient went to the ER on 02/26/15 and was treated with oral steroids and cough medication. Symptoms have improved somewhat since that time. Still with some coughing. Denies fevers or chills.   ER chest x-ray results were as follows: Impression: No active cardiopulmonary disease.                 Unexplained Night Sweats   Patient complains of drenching nights sweats. Denies fevers and unexpected weight loss.    Assessment and plan:     Problem List Items Addressed This Visit      Respiratory   Intrinsic asthma with exacerbation - Primary     A sample and prescription have been provided for Dulera 200/5 g, 2 inhalations via spacer device twice a day.  Discontinue Symbicort.  The patient has been asked to contact me if her symptoms persist, progress, or if she becomes febrile. Otherwise, she may return for follow up in 2 months.      Relevant Medications   albuterol (VENTOLIN HFA) 108 (90 BASE) MCG/ACT inhaler   Fluticasone Furoate-Vilanterol (BREO ELLIPTA) 200-25 MCG/INH AEPB   Other Relevant Orders   Spirometry with Graph     Other   Unexplained night sweats    Paula Pollard has been asked to follow up with PCP regarding this issue. She has verbalized understanding and has agreed to do so.       Other Visit Diagnoses    Asthma with acute exacerbation, unspecified asthma severity        Relevant Medications    albuterol (VENTOLIN HFA) 108 (90 BASE) MCG/ACT inhaler    Fluticasone Furoate-Vilanterol (BREO ELLIPTA) 200-25 MCG/INH AEPB       The following portions of the patient's  history were reviewed and updated as appropriate: allergies, current medications, past family history, past medical history, past social history, past surgical history and problem list.  Outpatient medications:   Medication List       This list is accurate as of: 03/06/15  1:21 PM.  Always use your most recent med list.               aerochamber plus with mask inhaler  Use as instructed     VENTOLIN HFA 108 (90 BASE) MCG/ACT inhaler  Generic drug:  albuterol  Inhale 2 puffs into the lungs every 6 (six) hours as needed for wheezing or shortness of breath (cough).     albuterol 108 (90 BASE) MCG/ACT inhaler  Commonly known as:  PROVENTIL HFA;VENTOLIN HFA  Inhale 2 puffs into the lungs every 2 (two) hours as needed for wheezing or shortness of breath (cough).     aspirin EC 81 MG tablet  Take 81 mg by mouth daily.     atorvastatin 40 MG tablet  Commonly known as:  LIPITOR  Take 1 tablet (40 mg total) by mouth every evening.     B-12 PO  Take 1 tablet by mouth as needed (for energy).     budesonide-formoterol 160-4.5 MCG/ACT inhaler  Commonly known as:  SYMBICORT  Inhale 2 puffs into the lungs 2 (two) times daily.     Calcium-Vitamin D3 600-125 MG-UNIT Tabs  Take 2 tablets by mouth daily.     chlorpheniramine-HYDROcodone 10-8 MG/5ML Suer  Commonly known as:  TUSSIONEX PENNKINETIC ER  Take 5 mLs by mouth every 12 (twelve) hours as needed for cough.     docusate sodium 100 MG capsule  Commonly known as:  COLACE  Take 2 capsules (200 mg total) by mouth daily as needed for constipation. For constipation     ECHINACEA ACZ PO  Take 1 capsule by mouth as needed (for immune support).     esomeprazole 20 MG capsule  Commonly known as:  NEXIUM  Take 20 mg by mouth as needed (for heartburn).     Fluticasone Furoate-Vilanterol 200-25 MCG/INH Aepb  Commonly known as:  BREO ELLIPTA  Inhale 1 puff into the lungs once.     ibuprofen 200 MG tablet  Commonly known as:   ADVIL,MOTRIN  Take 200 mg by mouth every morning.     levofloxacin 500 MG tablet  Commonly known as:  LEVAQUIN  Take 500 mg by mouth daily.     LORazepam 1 MG tablet  Commonly known as:  ATIVAN  Take 1 mg by mouth every morning.     LORazepam 1 MG tablet  Commonly known as:  ATIVAN  Take 1 mg by mouth 2 (two) times daily. Takes 1/2 tablet in morning and 1/2 tablet in the evening     losartan 50 MG tablet  Commonly known as:  COZAAR  Take 1 tablet (50 mg total) by mouth every morning.     meclizine 25 MG tablet  Commonly known as:  ANTIVERT  Take 0.5 tablets (12.5 mg total) by mouth every morning.     nebivolol 5 MG tablet  Commonly known as:  BYSTOLIC  Take 1 tablet (5 mg total) by mouth daily.     ondansetron 4 MG disintegrating tablet  Commonly known as:  ZOFRAN ODT  4mg  ODT q4 hours prn nausea/vomit     predniSONE 20 MG tablet  Commonly known as:  DELTASONE  Take 3 tablets (60 mg total) by mouth daily.     SALINE NASAL SPRAY NA  Place 1 puff into the nose at bedtime.     triamterene-hydrochlorothiazide 37.5-25 MG tablet  Commonly known as:  MAXZIDE-25  Take 1 each (1 tablet total) by mouth every morning.        Known medication allergies: Allergies  Allergen Reactions  . Codeine Nausea Only and Other (See Comments)    Reaction:Dizziness and hallucinations "makes me climb walls"    Physical examination: Blood pressure 112/68, pulse 76, temperature 98.2 F (36.8 C), temperature source Oral, resp. rate 16.  General: Alert, interactive, in no acute distress. HEENT: TMs pearly gray, turbinates moderately edematous, post-pharynx markedly erythematous. Neck: Supple without lymphadenopathy. Lungs: Decreased breath sounds bilaterally without wheezing, rhonchi or rales. CV: Normal S1, S2 without murmurs. Skin: Warm and dry, without lesions or rashes.  Diagnositics: Spirometry: FVC was 1.13 L (57% predicted), and FEV1 was 0.91 L (60% predicted) with significant  15% postbronchodilator improvement.  I appreciate the opportunity to take part in this Paula Pollard's care. Please do not hesitate to contact me with questions.  Sincerely,   R. Edgar Frisk, MD

## 2015-04-02 ENCOUNTER — Ambulatory Visit: Payer: Medicare Other | Admitting: Allergy and Immunology

## 2015-04-09 DIAGNOSIS — E785 Hyperlipidemia, unspecified: Secondary | ICD-10-CM | POA: Diagnosis not present

## 2015-04-09 DIAGNOSIS — M1990 Unspecified osteoarthritis, unspecified site: Secondary | ICD-10-CM | POA: Diagnosis not present

## 2015-04-09 DIAGNOSIS — E559 Vitamin D deficiency, unspecified: Secondary | ICD-10-CM | POA: Diagnosis not present

## 2015-04-09 DIAGNOSIS — J209 Acute bronchitis, unspecified: Secondary | ICD-10-CM | POA: Diagnosis not present

## 2015-04-09 DIAGNOSIS — I340 Nonrheumatic mitral (valve) insufficiency: Secondary | ICD-10-CM | POA: Diagnosis not present

## 2015-04-09 DIAGNOSIS — I2510 Atherosclerotic heart disease of native coronary artery without angina pectoris: Secondary | ICD-10-CM | POA: Diagnosis not present

## 2015-04-09 DIAGNOSIS — I10 Essential (primary) hypertension: Secondary | ICD-10-CM | POA: Diagnosis not present

## 2015-04-09 DIAGNOSIS — I639 Cerebral infarction, unspecified: Secondary | ICD-10-CM | POA: Diagnosis not present

## 2015-04-16 DIAGNOSIS — E785 Hyperlipidemia, unspecified: Secondary | ICD-10-CM | POA: Diagnosis not present

## 2015-04-16 DIAGNOSIS — I10 Essential (primary) hypertension: Secondary | ICD-10-CM | POA: Diagnosis not present

## 2015-05-06 DIAGNOSIS — Z124 Encounter for screening for malignant neoplasm of cervix: Secondary | ICD-10-CM | POA: Diagnosis not present

## 2015-05-06 DIAGNOSIS — N882 Stricture and stenosis of cervix uteri: Secondary | ICD-10-CM | POA: Diagnosis not present

## 2015-05-06 DIAGNOSIS — Z01419 Encounter for gynecological examination (general) (routine) without abnormal findings: Secondary | ICD-10-CM | POA: Diagnosis not present

## 2015-05-06 DIAGNOSIS — N393 Stress incontinence (female) (male): Secondary | ICD-10-CM | POA: Diagnosis not present

## 2015-05-29 DIAGNOSIS — E7800 Pure hypercholesterolemia, unspecified: Secondary | ICD-10-CM | POA: Diagnosis not present

## 2015-05-29 DIAGNOSIS — E559 Vitamin D deficiency, unspecified: Secondary | ICD-10-CM | POA: Diagnosis not present

## 2015-05-29 DIAGNOSIS — K30 Functional dyspepsia: Secondary | ICD-10-CM | POA: Diagnosis not present

## 2015-05-29 DIAGNOSIS — I10 Essential (primary) hypertension: Secondary | ICD-10-CM | POA: Diagnosis not present

## 2015-07-09 DIAGNOSIS — E559 Vitamin D deficiency, unspecified: Secondary | ICD-10-CM | POA: Diagnosis not present

## 2015-07-09 DIAGNOSIS — I2510 Atherosclerotic heart disease of native coronary artery without angina pectoris: Secondary | ICD-10-CM | POA: Diagnosis not present

## 2015-07-09 DIAGNOSIS — I340 Nonrheumatic mitral (valve) insufficiency: Secondary | ICD-10-CM | POA: Diagnosis not present

## 2015-07-09 DIAGNOSIS — I639 Cerebral infarction, unspecified: Secondary | ICD-10-CM | POA: Diagnosis not present

## 2015-07-09 DIAGNOSIS — I10 Essential (primary) hypertension: Secondary | ICD-10-CM | POA: Diagnosis not present

## 2015-07-09 DIAGNOSIS — E785 Hyperlipidemia, unspecified: Secondary | ICD-10-CM | POA: Diagnosis not present

## 2015-07-09 DIAGNOSIS — M1990 Unspecified osteoarthritis, unspecified site: Secondary | ICD-10-CM | POA: Diagnosis not present

## 2015-08-06 DIAGNOSIS — M25551 Pain in right hip: Secondary | ICD-10-CM | POA: Diagnosis not present

## 2015-08-06 DIAGNOSIS — M170 Bilateral primary osteoarthritis of knee: Secondary | ICD-10-CM | POA: Diagnosis not present

## 2015-08-06 DIAGNOSIS — M79641 Pain in right hand: Secondary | ICD-10-CM | POA: Diagnosis not present

## 2015-09-13 ENCOUNTER — Ambulatory Visit (INDEPENDENT_AMBULATORY_CARE_PROVIDER_SITE_OTHER): Payer: Medicare Other | Admitting: Allergy and Immunology

## 2015-09-13 ENCOUNTER — Encounter: Payer: Self-pay | Admitting: Allergy and Immunology

## 2015-09-13 VITALS — BP 138/74 | HR 72 | Temp 97.5°F | Resp 16

## 2015-09-13 DIAGNOSIS — J4540 Moderate persistent asthma, uncomplicated: Secondary | ICD-10-CM | POA: Diagnosis not present

## 2015-09-13 DIAGNOSIS — J309 Allergic rhinitis, unspecified: Secondary | ICD-10-CM

## 2015-09-13 DIAGNOSIS — H1010 Acute atopic conjunctivitis, unspecified eye: Secondary | ICD-10-CM

## 2015-09-13 MED ORDER — AZELASTINE-FLUTICASONE 137-50 MCG/ACT NA SUSP: NASAL | Status: DC

## 2015-09-13 MED ORDER — ALBUTEROL SULFATE HFA 108 (90 BASE) MCG/ACT IN AERS: INHALATION_SPRAY | RESPIRATORY_TRACT | Status: DC

## 2015-09-13 NOTE — Progress Notes (Signed)
     FOLLOW UP NOTE  RE: MAANYA OVALLE MRN: [id number] DOB: 30-Apr-1930 [facility] [place] [address] [place] [address] Date of Office Visit: 09/13/2015  Subjective:  SHONTRICE AROCHA is a 82 y.o. female who presents today for Nasal Congestion  Assessment:   1. Allergic rhinoconjunctivitis   2. Moderate persistent asthma, uncomplicated    Plan:   Meds ordered this encounter  Medications  . albuterol (VENTOLIN HFA) 108 (90 Base) MCG/ACT inhaler    Sig: Use 2 puffs every 4 hours as needed for cough or wheeze.  May use 2 puffs 10-20 minutes prior to exercise.    Dispense:  1 Inhaler    Refill:  1  . Azelastine-Fluticasone 137-50 MCG/ACT SUSP    Sig: Use one spray in each nostril twice daily for stuffy nose or drainage.    Dispense:  1 Bottle    Refill:  2   Patient Instructions  1.  Prednisone 20 mg in office. 2.  Begin saline nasal wash 2-4 times daily. 3.  Use Dymista one spray twice daily--until sample empty. 4.  Continue Breo 1 puff once daily--new sample given. 5.  May use loratadine over-the-counter 10 mg once daily as needed. 6.  Ventolin 2 puffs every 4 hours as needed for cough or wheeze.--- Call office with any recurring use. 7.  Follow-up in 1-2 months or sooner if needed.  HPI: Zailynn returns to the office with report of intermittent nasal congestion with rare watery eyes without fever, headache, sore throat, discolored drainage, cough, wheeze or shortness of breath.  Generally she has been feeling well since her last visit with Dr. Verlin Fester September 2016, but thought her nasal symptoms could be better controlled, given the fluctuant weather pattern and pollen season.  She has added saline rinse in the evening but denies requirement for albuterol as recently using her Breo sample, she found at home given the expense of the [place].  Denies ED or urgent care visits, prednisone or antibiotic courses. Reports sleep and activity are  normal.  [place] has a current medication list which includes the following prescription(s): albuterol, aspirin ec, atorvastatin, calcium-vitamin d3, cyanocobalamin, esomeprazole, fluticasone furoate-vilanterol, ibuprofen, loratadine, lorazepam, losartan, meclizine, multiple vitamins-minerals, nebivolol, polyethyl glycol-propyl glycol, saline, triamterene-hydrochlorothiazide.   Drug Allergies: Allergies  Allergen Reactions  . Codeine Nausea Only and Other (See Comments)    Reaction:Dizziness and hallucinations "makes me climb walls"   Objective:   Filed Vitals:   09/13/15 1016  BP: 138/74  Pulse: 72  Temp: 97.5 F (36.4 C)  Resp: 16   SpO2 Readings from Last 1 Encounters:  09/13/15 96%   Physical Exam  Constitutional: She is well-developed, well-nourished, and in no distress.  Communicating easily in full sentences with normal voice.  HENT:  Head: Atraumatic.  Right Ear: Tympanic membrane and ear canal normal.  Left Ear: Tympanic membrane and ear canal normal.  Nose: Mucosal edema present. No rhinorrhea. No epistaxis.  Mouth/Throat: Oropharynx is clear and moist and mucous membranes are normal. No oropharyngeal exudate, posterior oropharyngeal edema or posterior oropharyngeal erythema.  Neck: Neck supple.  Cardiovascular: Normal rate, S1 normal and S2 normal.   No murmur heard. Pulmonary/Chest: Effort normal. She has no wheezes. She has no rhonchi. She has no rales.  Lymphadenopathy:    She has no cervical adenopathy.   Diagnostics: Spirometry:  FVC  1.59--945, FEV1 1.30--102%.    Bellamy Rubey M. Ishmael Holter, MD  cc: Charolette Forward, MD

## 2015-09-13 NOTE — Patient Instructions (Addendum)
  Prednisone 20 mg in office.  Begin saline nasal wash 2-4 times daily.  Use Dymista one spray twice daily--until sample empty.  Continue Breo 1 puff once daily.  May use loratadine over-the-counter 10 mg once daily as needed.  Ventolin 2 puffs every 4 hours as needed for cough or wheeze.--- Call office with any recurring use.  Follow-up in 1-2 months or sooner if needed.

## 2015-10-08 DIAGNOSIS — I639 Cerebral infarction, unspecified: Secondary | ICD-10-CM | POA: Diagnosis not present

## 2015-10-08 DIAGNOSIS — I10 Essential (primary) hypertension: Secondary | ICD-10-CM | POA: Diagnosis not present

## 2015-10-08 DIAGNOSIS — I340 Nonrheumatic mitral (valve) insufficiency: Secondary | ICD-10-CM | POA: Diagnosis not present

## 2015-10-08 DIAGNOSIS — E559 Vitamin D deficiency, unspecified: Secondary | ICD-10-CM | POA: Diagnosis not present

## 2015-10-08 DIAGNOSIS — E785 Hyperlipidemia, unspecified: Secondary | ICD-10-CM | POA: Diagnosis not present

## 2015-10-08 DIAGNOSIS — I2510 Atherosclerotic heart disease of native coronary artery without angina pectoris: Secondary | ICD-10-CM | POA: Diagnosis not present

## 2015-10-08 DIAGNOSIS — M1990 Unspecified osteoarthritis, unspecified site: Secondary | ICD-10-CM | POA: Diagnosis not present

## 2015-11-27 DIAGNOSIS — J309 Allergic rhinitis, unspecified: Secondary | ICD-10-CM | POA: Diagnosis not present

## 2015-11-27 DIAGNOSIS — R7309 Other abnormal glucose: Secondary | ICD-10-CM | POA: Diagnosis not present

## 2015-11-27 DIAGNOSIS — I10 Essential (primary) hypertension: Secondary | ICD-10-CM | POA: Diagnosis not present

## 2015-11-27 DIAGNOSIS — J45909 Unspecified asthma, uncomplicated: Secondary | ICD-10-CM | POA: Diagnosis not present

## 2015-12-27 ENCOUNTER — Other Ambulatory Visit: Payer: Self-pay | Admitting: Cardiology

## 2015-12-27 DIAGNOSIS — I639 Cerebral infarction, unspecified: Secondary | ICD-10-CM | POA: Diagnosis not present

## 2015-12-27 DIAGNOSIS — I10 Essential (primary) hypertension: Secondary | ICD-10-CM | POA: Diagnosis not present

## 2015-12-27 DIAGNOSIS — I25119 Atherosclerotic heart disease of native coronary artery with unspecified angina pectoris: Secondary | ICD-10-CM | POA: Diagnosis not present

## 2015-12-27 DIAGNOSIS — E785 Hyperlipidemia, unspecified: Secondary | ICD-10-CM | POA: Diagnosis not present

## 2015-12-27 DIAGNOSIS — R079 Chest pain, unspecified: Secondary | ICD-10-CM

## 2015-12-27 DIAGNOSIS — E559 Vitamin D deficiency, unspecified: Secondary | ICD-10-CM | POA: Diagnosis not present

## 2015-12-27 DIAGNOSIS — M1990 Unspecified osteoarthritis, unspecified site: Secondary | ICD-10-CM | POA: Diagnosis not present

## 2015-12-27 DIAGNOSIS — I340 Nonrheumatic mitral (valve) insufficiency: Secondary | ICD-10-CM | POA: Diagnosis not present

## 2016-01-01 ENCOUNTER — Encounter (HOSPITAL_COMMUNITY)
Admission: RE | Admit: 2016-01-01 | Discharge: 2016-01-01 | Disposition: A | Discharge status: Home / Self Care | Payer: Medicare Other | Source: Ambulatory Visit | Attending: Cardiology | Admitting: Cardiology

## 2016-01-01 DIAGNOSIS — E785 Hyperlipidemia, unspecified: Secondary | ICD-10-CM | POA: Diagnosis not present

## 2016-01-01 DIAGNOSIS — E559 Vitamin D deficiency, unspecified: Secondary | ICD-10-CM | POA: Diagnosis not present

## 2016-01-01 DIAGNOSIS — I639 Cerebral infarction, unspecified: Secondary | ICD-10-CM | POA: Diagnosis not present

## 2016-01-01 DIAGNOSIS — M1990 Unspecified osteoarthritis, unspecified site: Secondary | ICD-10-CM | POA: Diagnosis not present

## 2016-01-01 DIAGNOSIS — I10 Essential (primary) hypertension: Secondary | ICD-10-CM | POA: Diagnosis not present

## 2016-01-01 DIAGNOSIS — R079 Chest pain, unspecified: Secondary | ICD-10-CM | POA: Insufficient documentation

## 2016-01-01 DIAGNOSIS — I340 Nonrheumatic mitral (valve) insufficiency: Secondary | ICD-10-CM | POA: Diagnosis not present

## 2016-01-01 DIAGNOSIS — I25119 Atherosclerotic heart disease of native coronary artery with unspecified angina pectoris: Secondary | ICD-10-CM | POA: Diagnosis not present

## 2016-01-01 MED ORDER — TECHNETIUM TC 99M TETROFOSMIN IV KIT
10.0000 | PACK | Freq: Once | INTRAVENOUS | Status: AC | PRN
  Administered 2016-01-01: 10 via INTRAVENOUS

## 2016-01-01 MED ORDER — REGADENOSON 0.4 MG/5ML IV SOLN
INTRAVENOUS | Status: AC
  Filled 2016-01-01: qty 5

## 2016-01-01 MED ORDER — REGADENOSON 0.4 MG/5ML IV SOLN
0.4000 mg | Freq: Once | INTRAVENOUS | Status: AC
  Administered 2016-01-01: 0.4 mg via INTRAVENOUS

## 2016-01-01 MED ORDER — TECHNETIUM TC 99M TETROFOSMIN IV KIT
30.0000 | PACK | Freq: Once | INTRAVENOUS | Status: AC | PRN
  Administered 2016-01-01: 30 via INTRAVENOUS

## 2016-01-13 DIAGNOSIS — M8589 Other specified disorders of bone density and structure, multiple sites: Secondary | ICD-10-CM | POA: Diagnosis not present

## 2016-01-13 DIAGNOSIS — Z803 Family history of malignant neoplasm of breast: Secondary | ICD-10-CM | POA: Diagnosis not present

## 2016-01-13 DIAGNOSIS — Z1231 Encounter for screening mammogram for malignant neoplasm of breast: Secondary | ICD-10-CM | POA: Diagnosis not present

## 2016-02-27 DIAGNOSIS — Z0000 Encounter for general adult medical examination without abnormal findings: Secondary | ICD-10-CM | POA: Diagnosis not present

## 2016-02-27 DIAGNOSIS — R350 Frequency of micturition: Secondary | ICD-10-CM | POA: Diagnosis not present

## 2016-02-27 DIAGNOSIS — R7309 Other abnormal glucose: Secondary | ICD-10-CM | POA: Diagnosis not present

## 2016-02-27 DIAGNOSIS — D649 Anemia, unspecified: Secondary | ICD-10-CM | POA: Diagnosis not present

## 2016-02-27 DIAGNOSIS — I10 Essential (primary) hypertension: Secondary | ICD-10-CM | POA: Diagnosis not present

## 2016-02-27 DIAGNOSIS — E559 Vitamin D deficiency, unspecified: Secondary | ICD-10-CM | POA: Diagnosis not present

## 2016-02-27 DIAGNOSIS — M79641 Pain in right hand: Secondary | ICD-10-CM | POA: Diagnosis not present

## 2016-02-27 DIAGNOSIS — K146 Glossodynia: Secondary | ICD-10-CM | POA: Diagnosis not present

## 2016-02-27 DIAGNOSIS — J309 Allergic rhinitis, unspecified: Secondary | ICD-10-CM | POA: Diagnosis not present

## 2016-03-24 DIAGNOSIS — I10 Essential (primary) hypertension: Secondary | ICD-10-CM | POA: Diagnosis not present

## 2016-03-24 DIAGNOSIS — I2510 Atherosclerotic heart disease of native coronary artery without angina pectoris: Secondary | ICD-10-CM | POA: Diagnosis not present

## 2016-03-24 DIAGNOSIS — M1990 Unspecified osteoarthritis, unspecified site: Secondary | ICD-10-CM | POA: Diagnosis not present

## 2016-03-24 DIAGNOSIS — E785 Hyperlipidemia, unspecified: Secondary | ICD-10-CM | POA: Diagnosis not present

## 2016-03-24 DIAGNOSIS — I340 Nonrheumatic mitral (valve) insufficiency: Secondary | ICD-10-CM | POA: Diagnosis not present

## 2016-03-24 DIAGNOSIS — E559 Vitamin D deficiency, unspecified: Secondary | ICD-10-CM | POA: Diagnosis not present

## 2016-03-24 DIAGNOSIS — I639 Cerebral infarction, unspecified: Secondary | ICD-10-CM | POA: Diagnosis not present

## 2016-03-25 DIAGNOSIS — J310 Chronic rhinitis: Secondary | ICD-10-CM | POA: Diagnosis not present

## 2016-03-25 DIAGNOSIS — R430 Anosmia: Secondary | ICD-10-CM | POA: Diagnosis not present

## 2016-03-25 DIAGNOSIS — H6121 Impacted cerumen, right ear: Secondary | ICD-10-CM | POA: Diagnosis not present

## 2016-03-25 DIAGNOSIS — J343 Hypertrophy of nasal turbinates: Secondary | ICD-10-CM | POA: Diagnosis not present

## 2016-04-09 DIAGNOSIS — I10 Essential (primary) hypertension: Secondary | ICD-10-CM | POA: Diagnosis not present

## 2016-04-09 DIAGNOSIS — M79669 Pain in unspecified lower leg: Secondary | ICD-10-CM | POA: Diagnosis not present

## 2016-04-22 DIAGNOSIS — R430 Anosmia: Secondary | ICD-10-CM | POA: Diagnosis not present

## 2016-04-22 DIAGNOSIS — J310 Chronic rhinitis: Secondary | ICD-10-CM | POA: Diagnosis not present

## 2016-04-22 DIAGNOSIS — J343 Hypertrophy of nasal turbinates: Secondary | ICD-10-CM | POA: Diagnosis not present

## 2016-05-14 DIAGNOSIS — H52202 Unspecified astigmatism, left eye: Secondary | ICD-10-CM | POA: Diagnosis not present

## 2016-05-14 DIAGNOSIS — H5201 Hypermetropia, right eye: Secondary | ICD-10-CM | POA: Diagnosis not present

## 2016-05-14 DIAGNOSIS — H524 Presbyopia: Secondary | ICD-10-CM | POA: Diagnosis not present

## 2016-05-14 DIAGNOSIS — H52201 Unspecified astigmatism, right eye: Secondary | ICD-10-CM | POA: Diagnosis not present

## 2016-05-14 DIAGNOSIS — H2513 Age-related nuclear cataract, bilateral: Secondary | ICD-10-CM | POA: Diagnosis not present

## 2016-05-20 DIAGNOSIS — J310 Chronic rhinitis: Secondary | ICD-10-CM | POA: Diagnosis not present

## 2016-05-26 DIAGNOSIS — M79641 Pain in right hand: Secondary | ICD-10-CM | POA: Diagnosis not present

## 2016-05-27 DIAGNOSIS — L409 Psoriasis, unspecified: Secondary | ICD-10-CM | POA: Diagnosis not present

## 2016-05-27 DIAGNOSIS — Z6831 Body mass index (BMI) 31.0-31.9, adult: Secondary | ICD-10-CM | POA: Diagnosis not present

## 2016-05-27 DIAGNOSIS — M2550 Pain in unspecified joint: Secondary | ICD-10-CM | POA: Diagnosis not present

## 2016-05-27 DIAGNOSIS — I10 Essential (primary) hypertension: Secondary | ICD-10-CM | POA: Diagnosis not present

## 2016-06-23 DIAGNOSIS — I10 Essential (primary) hypertension: Secondary | ICD-10-CM | POA: Diagnosis not present

## 2016-06-23 DIAGNOSIS — I340 Nonrheumatic mitral (valve) insufficiency: Secondary | ICD-10-CM | POA: Diagnosis not present

## 2016-06-23 DIAGNOSIS — E785 Hyperlipidemia, unspecified: Secondary | ICD-10-CM | POA: Diagnosis not present

## 2016-06-23 DIAGNOSIS — I639 Cerebral infarction, unspecified: Secondary | ICD-10-CM | POA: Diagnosis not present

## 2016-06-23 DIAGNOSIS — M1990 Unspecified osteoarthritis, unspecified site: Secondary | ICD-10-CM | POA: Diagnosis not present

## 2016-06-23 DIAGNOSIS — E559 Vitamin D deficiency, unspecified: Secondary | ICD-10-CM | POA: Diagnosis not present

## 2016-06-23 DIAGNOSIS — I2510 Atherosclerotic heart disease of native coronary artery without angina pectoris: Secondary | ICD-10-CM | POA: Diagnosis not present

## 2016-07-27 DIAGNOSIS — N907 Vulvar cyst: Secondary | ICD-10-CM | POA: Diagnosis not present

## 2016-07-31 DIAGNOSIS — M67911 Unspecified disorder of synovium and tendon, right shoulder: Secondary | ICD-10-CM | POA: Diagnosis not present

## 2016-08-14 IMAGING — CR DG CHEST 2V
2 series · 2 of 2 positions shown · non-contrast
Comparison: 12/26/2014

CLINICAL DATA: Cough, congestion, and shortness of breath since
[DATE].

EXAM:
CHEST  2 VIEW

[chest pa]
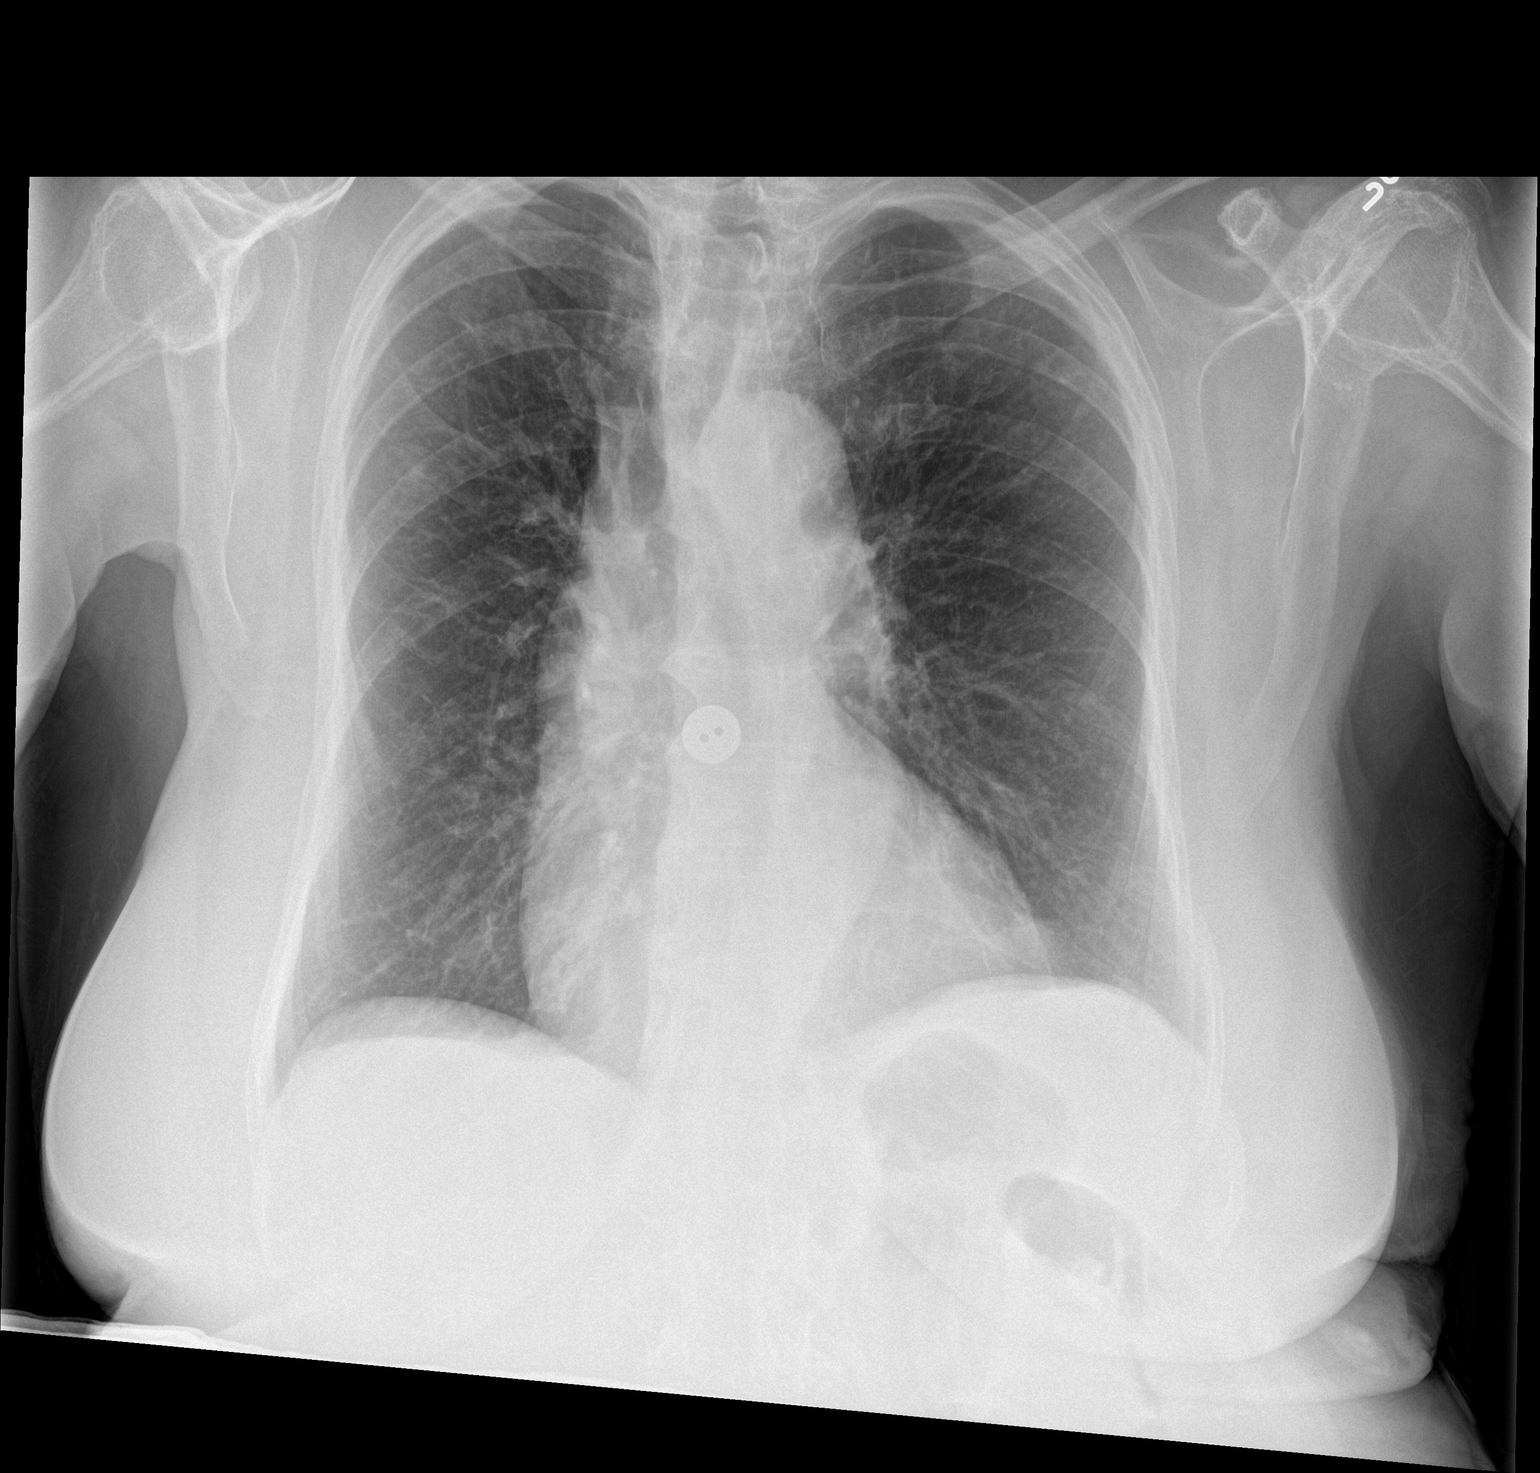

[chest lat]
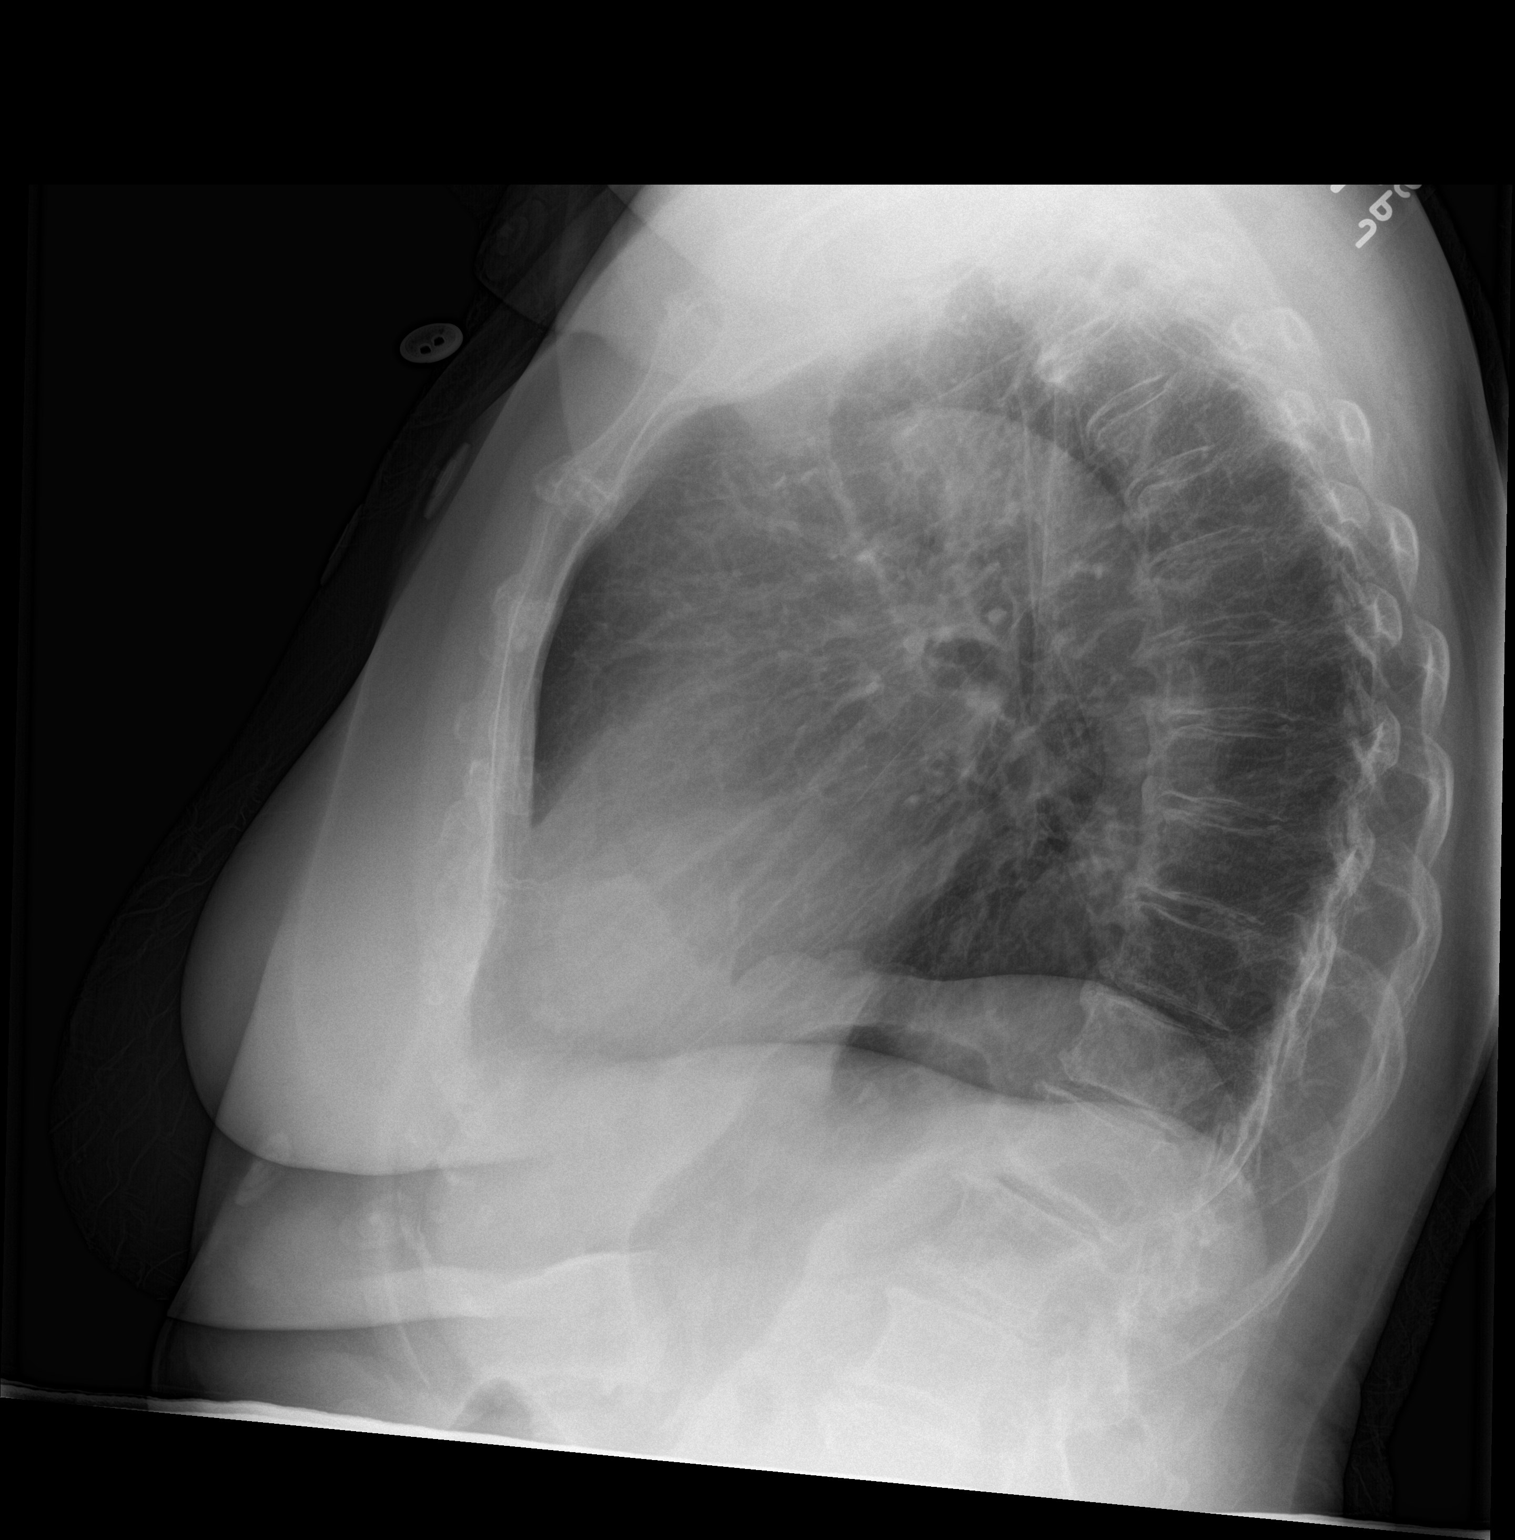

[2 of 2 positions shown; findings below may reference images not displayed]

FINDINGS: The patient is slightly rotated to the right. Cardiac silhouette is
upper limits of normal in size. Thoracic aorta is mildly tortuous.
The lungs are normally to slightly hyperinflated and clear,
unchanged. No acute osseous abnormality is identified.
IMPRESSION: No active cardiopulmonary disease.

## 2016-08-20 DIAGNOSIS — G5601 Carpal tunnel syndrome, right upper limb: Secondary | ICD-10-CM | POA: Diagnosis not present

## 2016-09-08 ENCOUNTER — Encounter: Payer: Self-pay | Admitting: Podiatry

## 2016-09-08 ENCOUNTER — Ambulatory Visit (INDEPENDENT_AMBULATORY_CARE_PROVIDER_SITE_OTHER): Payer: Medicare Other | Admitting: Podiatry

## 2016-09-08 DIAGNOSIS — L600 Ingrowing nail: Secondary | ICD-10-CM | POA: Diagnosis not present

## 2016-09-08 NOTE — Progress Notes (Signed)
   Subjective:    Patient ID: Paula Pollard, female    DOB: 10-Jul-1929, 83 y.o.   MRN: [id number]  HPI Chief Complaint  Patient presents with  . Nail Problem    Right foot; great toe; pt stated, "Wants to have nail removed today"      Review of Systems  HENT: Positive for sinus pain.   Eyes: Positive for itching.  Musculoskeletal: Positive for arthralgias.  All other systems reviewed and are negative.      Objective:   Physical Exam        Assessment & Plan:

## 2016-09-08 NOTE — Patient Instructions (Signed)

## 2016-09-14 NOTE — Progress Notes (Signed)
Subjective:     Patient ID: Paula Pollard, female   DOB: 06-Apr-1930, 83 y.o.   MRN: [id number]  HPI patient presents with pain in thickness of the first nail and third nail right foot stating that she cannot cut them and that they're increasingly tender in shoe gear   Review of Systems  All other systems reviewed and are negative.      Objective:   Physical Exam  Constitutional: She is oriented to person, place, and time.  Cardiovascular: Intact distal pulses.   Musculoskeletal: Normal range of motion.  Neurological: She is oriented to person, place, and time.  Skin: Skin is warm and dry.  Nursing note and vitals reviewed.  neurovascular status was found to be intact with good digital perfusion to all digits 1 through 5 bilateral and good muscle strength. Patient has reduced range of motion subtalar joint and was noted to have a very thickened painful hallux and third nail right foot with also slight dystrophy to the fourth nail. The first and third are the ones that are causing her most pain and she's not able to wear shoe gear comfortably or trim them     Assessment:     Damage nailbeds first and third right with thickness and incurvation noted    Plan:     H&P conditions reviewed and treatment options discussed. At this point I have recommended removal of the nail and we discussed this versus trimming and she is not interested and trimming. I explained risk of procedures and today I infiltrated the right hallux and third toe with 60 mg Xylocaine Marcaine mixture remove the nailbed bilateral and apply chemical consisting of phenol for applications 30 seconds followed by alcohol lavage and sterile dressing area gave instructions on soaks and reappoint

## 2016-09-21 ENCOUNTER — Ambulatory Visit: Payer: Medicare Other | Admitting: Podiatry

## 2016-09-24 ENCOUNTER — Ambulatory Visit (INDEPENDENT_AMBULATORY_CARE_PROVIDER_SITE_OTHER): Payer: Medicare Other | Admitting: Podiatry

## 2016-09-24 ENCOUNTER — Encounter: Payer: Self-pay | Admitting: Podiatry

## 2016-09-24 DIAGNOSIS — E785 Hyperlipidemia, unspecified: Secondary | ICD-10-CM | POA: Diagnosis not present

## 2016-09-24 DIAGNOSIS — Z79899 Other long term (current) drug therapy: Secondary | ICD-10-CM | POA: Diagnosis not present

## 2016-09-24 DIAGNOSIS — L600 Ingrowing nail: Secondary | ICD-10-CM | POA: Diagnosis not present

## 2016-09-24 DIAGNOSIS — I10 Essential (primary) hypertension: Secondary | ICD-10-CM | POA: Diagnosis not present

## 2016-09-24 DIAGNOSIS — R7309 Other abnormal glucose: Secondary | ICD-10-CM | POA: Diagnosis not present

## 2016-09-24 NOTE — Patient Instructions (Signed)

## 2016-09-24 NOTE — Progress Notes (Signed)
Subjective:     Patient ID: Paula Pollard, female   DOB: 1930/03/04, 83 y.o.   MRN: [id number]  HPI patient presents stating I have a painful left big toenail that I need to get removed and I cannot remove the same time as he other 2. Patient states it's been present for a number of years   Review of Systems     Objective:   Physical Exam Neurovascular status intact muscle strength adequate with a thickened incurvated painful left hallux nail that's dystrophic and difficult to walk with    Assessment:     Chronic damaged left hallux nail    Plan:     H&P condition reviewed and recommended removal. At this point I infiltrated the left hallux 60 Milligan times like Marcaine mixture I talked about risk associated with this procedure and patient wants it done and I under sterile conditions remove the hallux nail exposed matrix and applied phenol for applications 30 seconds followed by alcohol lavage and sterile dressing. Gave instructions on soaks and reappoint

## 2016-10-02 DIAGNOSIS — E871 Hypo-osmolality and hyponatremia: Secondary | ICD-10-CM | POA: Diagnosis not present

## 2016-10-08 DIAGNOSIS — I10 Essential (primary) hypertension: Secondary | ICD-10-CM | POA: Diagnosis not present

## 2016-10-08 DIAGNOSIS — I2510 Atherosclerotic heart disease of native coronary artery without angina pectoris: Secondary | ICD-10-CM | POA: Diagnosis not present

## 2016-10-08 DIAGNOSIS — I639 Cerebral infarction, unspecified: Secondary | ICD-10-CM | POA: Diagnosis not present

## 2016-10-08 DIAGNOSIS — M1990 Unspecified osteoarthritis, unspecified site: Secondary | ICD-10-CM | POA: Diagnosis not present

## 2016-10-08 DIAGNOSIS — I340 Nonrheumatic mitral (valve) insufficiency: Secondary | ICD-10-CM | POA: Diagnosis not present

## 2016-10-08 DIAGNOSIS — E559 Vitamin D deficiency, unspecified: Secondary | ICD-10-CM | POA: Diagnosis not present

## 2016-10-08 DIAGNOSIS — N179 Acute kidney failure, unspecified: Secondary | ICD-10-CM | POA: Diagnosis not present

## 2016-10-08 DIAGNOSIS — E785 Hyperlipidemia, unspecified: Secondary | ICD-10-CM | POA: Diagnosis not present

## 2016-12-29 DIAGNOSIS — M19012 Primary osteoarthritis, left shoulder: Secondary | ICD-10-CM | POA: Diagnosis not present

## 2016-12-29 DIAGNOSIS — M25511 Pain in right shoulder: Secondary | ICD-10-CM | POA: Diagnosis not present

## 2016-12-31 ENCOUNTER — Ambulatory Visit (INDEPENDENT_AMBULATORY_CARE_PROVIDER_SITE_OTHER): Payer: Medicare Other | Admitting: Podiatry

## 2016-12-31 ENCOUNTER — Encounter: Payer: Self-pay | Admitting: Podiatry

## 2016-12-31 DIAGNOSIS — L600 Ingrowing nail: Secondary | ICD-10-CM

## 2016-12-31 DIAGNOSIS — M2042 Other hammer toe(s) (acquired), left foot: Secondary | ICD-10-CM | POA: Diagnosis not present

## 2016-12-31 DIAGNOSIS — M2041 Other hammer toe(s) (acquired), right foot: Secondary | ICD-10-CM | POA: Diagnosis not present

## 2017-01-01 NOTE — Progress Notes (Signed)
Subjective:    Patient ID: Paula Pollard, female   DOB: 83 y.o.   MRN: [id number]   HPI patient is concerned about her nails that were removed and whether or not there may be regrowth or ingrown toenail    ROS      Objective:  Physical Exam neurovascular status intact with discomfort on the lateral side of the left hallux nail localized in nature with no active drainage noted     Assessment:    Appears to be more of a crusted type tissue versus a actual ingrown toenail     Plan:    Sterile debridement of tissue accomplished and flushed the area and applied sterile dressing. This should heal uneventfully and will be seen back as needed

## 2017-01-11 DIAGNOSIS — E559 Vitamin D deficiency, unspecified: Secondary | ICD-10-CM | POA: Diagnosis not present

## 2017-01-11 DIAGNOSIS — I340 Nonrheumatic mitral (valve) insufficiency: Secondary | ICD-10-CM | POA: Diagnosis not present

## 2017-01-11 DIAGNOSIS — I2510 Atherosclerotic heart disease of native coronary artery without angina pectoris: Secondary | ICD-10-CM | POA: Diagnosis not present

## 2017-01-11 DIAGNOSIS — I10 Essential (primary) hypertension: Secondary | ICD-10-CM | POA: Diagnosis not present

## 2017-01-11 DIAGNOSIS — I639 Cerebral infarction, unspecified: Secondary | ICD-10-CM | POA: Diagnosis not present

## 2017-01-11 DIAGNOSIS — M1990 Unspecified osteoarthritis, unspecified site: Secondary | ICD-10-CM | POA: Diagnosis not present

## 2017-01-11 DIAGNOSIS — E785 Hyperlipidemia, unspecified: Secondary | ICD-10-CM | POA: Diagnosis not present

## 2017-01-14 DIAGNOSIS — Z1231 Encounter for screening mammogram for malignant neoplasm of breast: Secondary | ICD-10-CM | POA: Diagnosis not present

## 2017-01-14 DIAGNOSIS — Z803 Family history of malignant neoplasm of breast: Secondary | ICD-10-CM | POA: Diagnosis not present

## 2017-03-04 DIAGNOSIS — M79642 Pain in left hand: Secondary | ICD-10-CM | POA: Diagnosis not present

## 2017-04-01 DIAGNOSIS — E559 Vitamin D deficiency, unspecified: Secondary | ICD-10-CM | POA: Diagnosis not present

## 2017-04-01 DIAGNOSIS — R202 Paresthesia of skin: Secondary | ICD-10-CM | POA: Diagnosis not present

## 2017-04-01 DIAGNOSIS — N182 Chronic kidney disease, stage 2 (mild): Secondary | ICD-10-CM | POA: Diagnosis not present

## 2017-04-01 DIAGNOSIS — I129 Hypertensive chronic kidney disease with stage 1 through stage 4 chronic kidney disease, or unspecified chronic kidney disease: Secondary | ICD-10-CM | POA: Diagnosis not present

## 2017-04-01 DIAGNOSIS — M2550 Pain in unspecified joint: Secondary | ICD-10-CM | POA: Diagnosis not present

## 2017-04-01 DIAGNOSIS — R7309 Other abnormal glucose: Secondary | ICD-10-CM | POA: Diagnosis not present

## 2017-04-01 DIAGNOSIS — Z0000 Encounter for general adult medical examination without abnormal findings: Secondary | ICD-10-CM | POA: Diagnosis not present

## 2017-04-12 DIAGNOSIS — I2510 Atherosclerotic heart disease of native coronary artery without angina pectoris: Secondary | ICD-10-CM | POA: Diagnosis not present

## 2017-04-12 DIAGNOSIS — I639 Cerebral infarction, unspecified: Secondary | ICD-10-CM | POA: Diagnosis not present

## 2017-04-12 DIAGNOSIS — E785 Hyperlipidemia, unspecified: Secondary | ICD-10-CM | POA: Diagnosis not present

## 2017-04-12 DIAGNOSIS — E559 Vitamin D deficiency, unspecified: Secondary | ICD-10-CM | POA: Diagnosis not present

## 2017-04-12 DIAGNOSIS — I340 Nonrheumatic mitral (valve) insufficiency: Secondary | ICD-10-CM | POA: Diagnosis not present

## 2017-04-12 DIAGNOSIS — M1990 Unspecified osteoarthritis, unspecified site: Secondary | ICD-10-CM | POA: Diagnosis not present

## 2017-04-12 DIAGNOSIS — I10 Essential (primary) hypertension: Secondary | ICD-10-CM | POA: Diagnosis not present

## 2017-05-14 DIAGNOSIS — H52201 Unspecified astigmatism, right eye: Secondary | ICD-10-CM | POA: Diagnosis not present

## 2017-05-14 DIAGNOSIS — H5201 Hypermetropia, right eye: Secondary | ICD-10-CM | POA: Diagnosis not present

## 2017-05-14 DIAGNOSIS — H2513 Age-related nuclear cataract, bilateral: Secondary | ICD-10-CM | POA: Diagnosis not present

## 2017-05-14 DIAGNOSIS — H52202 Unspecified astigmatism, left eye: Secondary | ICD-10-CM | POA: Diagnosis not present

## 2017-05-14 DIAGNOSIS — H524 Presbyopia: Secondary | ICD-10-CM | POA: Diagnosis not present

## 2017-07-12 DIAGNOSIS — I2510 Atherosclerotic heart disease of native coronary artery without angina pectoris: Secondary | ICD-10-CM | POA: Diagnosis not present

## 2017-07-12 DIAGNOSIS — E785 Hyperlipidemia, unspecified: Secondary | ICD-10-CM | POA: Diagnosis not present

## 2017-07-12 DIAGNOSIS — I340 Nonrheumatic mitral (valve) insufficiency: Secondary | ICD-10-CM | POA: Diagnosis not present

## 2017-07-12 DIAGNOSIS — E559 Vitamin D deficiency, unspecified: Secondary | ICD-10-CM | POA: Diagnosis not present

## 2017-07-12 DIAGNOSIS — I639 Cerebral infarction, unspecified: Secondary | ICD-10-CM | POA: Diagnosis not present

## 2017-07-12 DIAGNOSIS — M1990 Unspecified osteoarthritis, unspecified site: Secondary | ICD-10-CM | POA: Diagnosis not present

## 2017-07-12 DIAGNOSIS — I10 Essential (primary) hypertension: Secondary | ICD-10-CM | POA: Diagnosis not present

## 2017-08-05 DIAGNOSIS — N182 Chronic kidney disease, stage 2 (mild): Secondary | ICD-10-CM | POA: Diagnosis not present

## 2017-08-05 DIAGNOSIS — R7309 Other abnormal glucose: Secondary | ICD-10-CM | POA: Diagnosis not present

## 2017-08-05 DIAGNOSIS — J309 Allergic rhinitis, unspecified: Secondary | ICD-10-CM | POA: Diagnosis not present

## 2017-08-05 DIAGNOSIS — I129 Hypertensive chronic kidney disease with stage 1 through stage 4 chronic kidney disease, or unspecified chronic kidney disease: Secondary | ICD-10-CM | POA: Diagnosis not present

## 2017-08-05 DIAGNOSIS — E785 Hyperlipidemia, unspecified: Secondary | ICD-10-CM | POA: Diagnosis not present

## 2017-08-05 DIAGNOSIS — Z79899 Other long term (current) drug therapy: Secondary | ICD-10-CM | POA: Diagnosis not present

## 2017-10-05 ENCOUNTER — Ambulatory Visit (INDEPENDENT_AMBULATORY_CARE_PROVIDER_SITE_OTHER): Payer: Medicare Other | Admitting: Allergy and Immunology

## 2017-10-05 ENCOUNTER — Encounter: Payer: Self-pay | Admitting: Allergy and Immunology

## 2017-10-05 VITALS — BP 126/76 | HR 70 | Temp 97.6°F | Resp 18

## 2017-10-05 DIAGNOSIS — J4520 Mild intermittent asthma, uncomplicated: Secondary | ICD-10-CM | POA: Diagnosis not present

## 2017-10-05 DIAGNOSIS — J019 Acute sinusitis, unspecified: Secondary | ICD-10-CM | POA: Insufficient documentation

## 2017-10-05 DIAGNOSIS — J0100 Acute maxillary sinusitis, unspecified: Secondary | ICD-10-CM

## 2017-10-05 DIAGNOSIS — J3089 Other allergic rhinitis: Secondary | ICD-10-CM | POA: Insufficient documentation

## 2017-10-05 MED ORDER — FLUTICASONE PROPIONATE 50 MCG/ACT NA SUSP: 2.0000 | Freq: Every day | NASAL | 5 refills | Status: DC | PRN

## 2017-10-05 MED ORDER — ALBUTEROL SULFATE HFA 108 (90 BASE) MCG/ACT IN AERS: 2.0000 | INHALATION_SPRAY | Freq: Four times a day (QID) | RESPIRATORY_TRACT | 1 refills | Status: DC | PRN

## 2017-10-05 NOTE — Assessment & Plan Note (Addendum)
   A prescription has been provided for fluticasone nasal spray, one spray per nostril 2 times daily for now. Proper nasal spray technique has been discussed and demonstrated.  Nasal saline spray (i.e., Simply Saline) or nasal saline lavage (i.e., NeilMed) is recommended as needed and prior to medicated nasal sprays  For thick post nasal drainage, add guaifenesin 600 mg (Mucinex)  twice daily as needed with adequate hydration as discussed.  If symptoms persist or progress over the next few days, prednisone has been provided, 20 mg x 4 days, 10 mg x1 day, then stop.  The patient has been asked to contact us if her symptoms persist, progress, or if she becomes febrile. Otherwise, she may return for follow up in 6 months.

## 2017-10-05 NOTE — Patient Instructions (Addendum)
Acute sinusitis  A prescription has been provided for fluticasone nasal spray, one spray per nostril 2 times daily for now. Proper nasal spray technique has been discussed and demonstrated.  Nasal saline spray (i.e., Simply Saline) or nasal saline lavage (i.e., NeilMed) is recommended as needed and prior to medicated nasal sprays  For thick post nasal drainage, add guaifenesin 600 mg (Mucinex)  twice daily as needed with adequate hydration as discussed.  If symptoms persist or progress over the next few days, prednisone has been provided, 20 mg x 4 days, 10 mg x1 day, then stop.  The patient has been asked to contact us if her symptoms persist, progress, or if she becomes febrile. Otherwise, she may return for follow up in 6 months.  Mild intermittent asthma  A refill prescription has been provided for albuterol HFA, 1 to 2 inhalations every 6 hours if needed.  Subjective and objective measures of pulmonary function will be followed and the treatment plan will be adjusted accordingly.   Return in about 6 months (around 04/06/2018), or if symptoms worsen or fail to improve.

## 2017-10-05 NOTE — Assessment & Plan Note (Signed)
   A refill prescription has been provided for albuterol HFA, 1 to 2 inhalations every 6 hours if needed.  Subjective and objective measures of pulmonary function will be followed and the treatment plan will be adjusted accordingly.

## 2017-10-05 NOTE — Progress Notes (Signed)
Follow-up Note  RE: Paula Pollard MRN: [id number] DOB: 1929/07/31 Date of Office Visit: 10/05/2017  Primary care provider: Glendale Chard, MD Referring provider: Glendale Chard, MD  History of present illness: Paula Pollard is a 84 y.o. female allergic rhinoconjunctivitis presenting today for sick visit.  She was last seen in this clinic in April 2017 by Dr. Ishmael Holter, who has since left the practice.  She complains of nasal congestion, sinus pressure, and ear pressure.  Reports that she ran out of medicated nasal spray occasionally uses nasal saline spray and/or Vicks sinus inhaled as needed.  She has not been experiencing fevers, chills, or discolored mucus production.  She denies recent dyspnea, chest tightness, and wheezing despite not being on an asthma controller medication.  She needs a refill for albuterol HFA.  Assessment and plan: Acute sinusitis  A prescription has been provided for fluticasone nasal spray, one spray per nostril 2 times daily for now. Proper nasal spray technique has been discussed and demonstrated.  Nasal saline spray (i.e., Simply Saline) or nasal saline lavage (i.e., NeilMed) is recommended as needed and prior to medicated nasal sprays  For thick post nasal drainage, add guaifenesin 600 mg (Mucinex)  twice daily as needed with adequate hydration as discussed.  If symptoms persist or progress over the next few days, prednisone has been provided, 20 mg x 4 days, 10 mg x1 day, then stop.  The patient has been asked to contact us if her symptoms persist, progress, or if she becomes febrile. Otherwise, she may return for follow up in 6 months.  Mild intermittent asthma  A refill prescription has been provided for albuterol HFA, 1 to 2 inhalations every 6 hours if needed.  Subjective and objective measures of pulmonary function will be followed and the treatment plan will be adjusted accordingly.   Meds ordered this encounter  Medications  . albuterol  (PROVENTIL HFA;VENTOLIN HFA) 108 (90 Base) MCG/ACT inhaler    Sig: Inhale 2 puffs into the lungs every 6 (six) hours as needed for wheezing or shortness of breath.    Dispense:  18 g    Refill:  1  . fluticasone (FLONASE) 50 MCG/ACT nasal spray    Sig: Place 2 sprays into both nostrils daily as needed for allergies or rhinitis.    Dispense:  18.2 g    Refill:  5    Diagnostics: Spirometry reveals an FVC of 2.09 L and an FEV1 of 1.27 L (82% predicted) with an FEV1 ratio of 84%.  Please see scanned spirometry results for details.    Physical examination: Blood pressure 126/76, pulse 70, temperature 97.6 F (36.4 C), temperature source Oral, resp. rate 18, SpO2 94 %.  General: Alert, interactive, in no acute distress. HEENT: TMs pearly gray, turbinates edematous with thick discharge, post-pharynx moderately erythematous. Neck: Supple without lymphadenopathy. Lungs: Clear to auscultation without wheezing, rhonchi or rales. CV: Normal S1, S2 without murmurs. Skin: Warm and dry, without lesions or rashes.  The following portions of the patient's history were reviewed and updated as appropriate: allergies, current medications, past family history, past medical history, past social history, past surgical history and problem list.  Allergies as of 10/05/2017      Reactions   Codeine Nausea Only, Other (See Comments)   Reaction:Dizziness and hallucinations "makes me climb walls"      Medication List        Accurate as of 10/05/17  1:06 PM. Always use your most recent med list.  albuterol 108 (90 Base) MCG/ACT inhaler Commonly known as:  PROVENTIL HFA;VENTOLIN HFA Inhale 2 puffs into the lungs every 6 (six) hours as needed for wheezing or shortness of breath.   aspirin EC 81 MG tablet Take 81 mg by mouth daily.   Azelastine-Fluticasone 137-50 MCG/ACT Susp Use one spray in each nostril twice daily for stuffy nose or drainage.   B-12 PO Take 1 tablet by mouth as needed  (for energy).   Calcium-Vitamin D3 600-125 MG-UNIT Tabs Take 2 tablets by mouth daily.   docusate sodium 100 MG capsule Commonly known as:  COLACE Take 2 capsules (200 mg total) by mouth daily as needed for constipation. For constipation   ECHINACEA ACZ PO Take 1 capsule by mouth as needed (for immune support).   esomeprazole 20 MG capsule Commonly known as:  NEXIUM Take 20 mg by mouth as needed (for heartburn).   fexofenadine 180 MG tablet Commonly known as:  ALLEGRA Take 180 mg by mouth daily.   fluticasone 50 MCG/ACT nasal spray Commonly known as:  FLONASE Place 2 sprays into both nostrils daily as needed for allergies or rhinitis.   meclizine 25 MG tablet Commonly known as:  ANTIVERT Take 0.5 tablets (12.5 mg total) by mouth every morning.   nebivolol 5 MG tablet Commonly known as:  BYSTOLIC Take 1 tablet (5 mg total) by mouth daily.   ondansetron 4 MG disintegrating tablet Commonly known as:  ZOFRAN ODT 4mg  ODT q4 hours prn nausea/vomit   SALINE NASAL SPRAY NA Place 1 puff into the nose at bedtime.   SYSTANE 0.4-0.3 % Soln Generic drug:  Polyethyl Glycol-Propyl Glycol Apply 1 drop to eye 2 (two) times daily.   telmisartan 20 MG tablet Commonly known as:  MICARDIS Take 20 mg by mouth daily.   triamterene-hydrochlorothiazide 37.5-25 MG tablet Commonly known as:  MAXZIDE-25 Take 1 each (1 tablet total) by mouth every morning.       Allergies  Allergen Reactions  . Codeine Nausea Only and Other (See Comments)    Reaction:Dizziness and hallucinations "makes me climb walls"   Review of systems: Review of systems negative except as noted in HPI / PMHx or noted below: Constitutional: Negative.  HENT: Negative.   Eyes: Negative.  Respiratory: Negative.   Cardiovascular: Negative.  Gastrointestinal: Negative.  Genitourinary: Negative.  Musculoskeletal: Negative.  Neurological: Negative.  Endo/Heme/Allergies: Negative.  Cutaneous: Negative.  Past  Medical History:  Diagnosis Date  . Anemia    "in past as a young girl"  . Anxiety   . Arthritis   . Bronchitis    hx of  . Cataract    bilaterally, "no surgery at this time"  . Dysrhythmia   . GERD (gastroesophageal reflux disease)   . Headache(784.0)    hx of migraines  . Hyperlipidemia   . Hypertension    sees Dr. Terrence Dupont  . Neuromuscular disorder (HCC)    hx of carpal tunnel "never had surgery for"    History reviewed. No pertinent family history.  Social History   Socioeconomic History  . Marital status: Divorced    Spouse name: Not on file  . Number of children: Not on file  . Years of education: Not on file  . Highest education level: Not on file  Occupational History  . Not on file  Social Needs  . Financial resource strain: Not on file  . Food insecurity:    Worry: Not on file    Inability: Not on file  . Transportation needs:  Medical: Not on file    Non-medical: Not on file  Tobacco Use  . Smoking status: Never Smoker  . Smokeless tobacco: Never Used  Substance and Sexual Activity  . Alcohol use: No  . Drug use: No  . Sexual activity: Not on file  Lifestyle  . Physical activity:    Days per week: Not on file    Minutes per session: Not on file  . Stress: Not on file  Relationships  . Social connections:    Talks on phone: Not on file    Gets together: Not on file    Attends religious service: Not on file    Active member of club or organization: Not on file    Attends meetings of clubs or organizations: Not on file    Relationship status: Not on file  . Intimate partner violence:    Fear of current or ex partner: Not on file    Emotionally abused: Not on file    Physically abused: Not on file    Forced sexual activity: Not on file  Other Topics Concern  . Not on file  Social History Narrative  . Not on file    I appreciate the opportunity to take part in Chaeli's care. Please do not hesitate to contact me with  questions.  Sincerely,   R. Edgar Frisk, MD

## 2017-10-11 DIAGNOSIS — M1990 Unspecified osteoarthritis, unspecified site: Secondary | ICD-10-CM | POA: Diagnosis not present

## 2017-10-11 DIAGNOSIS — I2510 Atherosclerotic heart disease of native coronary artery without angina pectoris: Secondary | ICD-10-CM | POA: Diagnosis not present

## 2017-10-11 DIAGNOSIS — E559 Vitamin D deficiency, unspecified: Secondary | ICD-10-CM | POA: Diagnosis not present

## 2017-10-11 DIAGNOSIS — I639 Cerebral infarction, unspecified: Secondary | ICD-10-CM | POA: Diagnosis not present

## 2017-10-11 DIAGNOSIS — I340 Nonrheumatic mitral (valve) insufficiency: Secondary | ICD-10-CM | POA: Diagnosis not present

## 2017-10-11 DIAGNOSIS — E785 Hyperlipidemia, unspecified: Secondary | ICD-10-CM | POA: Diagnosis not present

## 2017-10-11 DIAGNOSIS — I10 Essential (primary) hypertension: Secondary | ICD-10-CM | POA: Diagnosis not present

## 2017-10-19 DIAGNOSIS — I129 Hypertensive chronic kidney disease with stage 1 through stage 4 chronic kidney disease, or unspecified chronic kidney disease: Secondary | ICD-10-CM | POA: Diagnosis not present

## 2017-10-19 DIAGNOSIS — R7309 Other abnormal glucose: Secondary | ICD-10-CM | POA: Diagnosis not present

## 2017-10-19 DIAGNOSIS — J309 Allergic rhinitis, unspecified: Secondary | ICD-10-CM | POA: Diagnosis not present

## 2017-10-19 DIAGNOSIS — R1013 Epigastric pain: Secondary | ICD-10-CM | POA: Diagnosis not present

## 2017-10-19 DIAGNOSIS — N182 Chronic kidney disease, stage 2 (mild): Secondary | ICD-10-CM | POA: Diagnosis not present

## 2017-10-19 DIAGNOSIS — R350 Frequency of micturition: Secondary | ICD-10-CM | POA: Diagnosis not present

## 2017-10-19 DIAGNOSIS — R102 Pelvic and perineal pain: Secondary | ICD-10-CM | POA: Diagnosis not present

## 2017-10-25 ENCOUNTER — Other Ambulatory Visit: Payer: Self-pay | Admitting: Internal Medicine

## 2017-10-25 DIAGNOSIS — R102 Pelvic and perineal pain: Secondary | ICD-10-CM

## 2017-10-28 ENCOUNTER — Ambulatory Visit
Admission: RE | Admit: 2017-10-28 | Discharge: 2017-10-28 | Disposition: A | Discharge status: Home / Self Care | Payer: Medicare Other | Source: Ambulatory Visit | Attending: Internal Medicine | Admitting: Internal Medicine

## 2017-10-28 DIAGNOSIS — R102 Pelvic and perineal pain: Secondary | ICD-10-CM

## 2017-12-22 DIAGNOSIS — Z124 Encounter for screening for malignant neoplasm of cervix: Secondary | ICD-10-CM | POA: Diagnosis not present

## 2017-12-22 DIAGNOSIS — N9489 Other specified conditions associated with female genital organs and menstrual cycle: Secondary | ICD-10-CM | POA: Diagnosis not present

## 2017-12-22 DIAGNOSIS — N952 Postmenopausal atrophic vaginitis: Secondary | ICD-10-CM | POA: Diagnosis not present

## 2017-12-23 ENCOUNTER — Other Ambulatory Visit: Payer: Self-pay | Admitting: Obstetrics and Gynecology

## 2017-12-29 NOTE — Patient Instructions (Addendum)
Your procedure is scheduled on: Thursday January 06, 2018 at 11:30 am   Enter through the Main Entrance of [facility] at:10:00 am  Pick up the phone at the desk and dial [phone].  Call this number if you have problems the morning of surgery: [phone].  Remember: Do NOT eat food: after Midnight on Wednesday July 31 Do NOT drink clear liquids after: 5:30 am day of surgery  Take these medicines the morning of surgery with a SIP OF WATER: Bystolic, Lorazepam, Meclizine. If needed take Nexium, allegra, Flonase spray,  Albuterol inhaler  BRING ALBUTEROL INHALER WITH YOU DAY OF SURGERY  STOP ALL VITAMINS, SUPPLEMENTS, HERBAL MEDICATIONS NOW  DO NOT SMOKE DAY OF SURGERY  BRUSH YOUR TEETH DAY OF SURGERY    Do NOT wear jewelry (body piercing), metal hair clips/bobby pins, make-up, or nail polish. Do NOT wear lotions, powders, or perfumes.  You may wear deoderant. Do NOT shave for 48 hours prior to surgery. Do NOT bring valuables to the hospital. Contacts, dentures, or bridgework may not be worn into surgery.  Have a responsible adult drive you home and stay with you for 24 hours after your procedureYour procedure is scheduled on:

## 2017-12-31 ENCOUNTER — Encounter (HOSPITAL_COMMUNITY): Payer: Self-pay

## 2017-12-31 ENCOUNTER — Encounter (HOSPITAL_COMMUNITY)
Admission: RE | Admit: 2017-12-31 | Discharge: 2017-12-31 | Disposition: A | Discharge status: Home / Self Care | Payer: Medicare Other | Source: Ambulatory Visit | Attending: Obstetrics and Gynecology | Admitting: Obstetrics and Gynecology

## 2017-12-31 ENCOUNTER — Other Ambulatory Visit: Payer: Self-pay

## 2017-12-31 DIAGNOSIS — I447 Left bundle-branch block, unspecified: Secondary | ICD-10-CM | POA: Insufficient documentation

## 2017-12-31 DIAGNOSIS — R1909 Other intra-abdominal and pelvic swelling, mass and lump: Secondary | ICD-10-CM | POA: Insufficient documentation

## 2017-12-31 DIAGNOSIS — Z01810 Encounter for preprocedural cardiovascular examination: Secondary | ICD-10-CM | POA: Insufficient documentation

## 2017-12-31 DIAGNOSIS — R102 Pelvic and perineal pain: Secondary | ICD-10-CM | POA: Diagnosis not present

## 2017-12-31 HISTORY — DX: Type 2 diabetes mellitus without complications: E11.9

## 2017-12-31 HISTORY — DX: Unspecified asthma, uncomplicated: J45.909

## 2017-12-31 HISTORY — DX: Personal history of other diseases of the digestive system: Z87.19

## 2017-12-31 LAB — CBC
HCT: 40 % (ref 36.0–46.0)
HEMOGLOBIN: 13 g/dL (ref 12.0–15.0)
MCH: 29.6 pg (ref 26.0–34.0)
MCHC: 32.5 g/dL (ref 30.0–36.0)
MCV: 91.1 fL (ref 78.0–100.0)
Platelets: 212 10*3/uL (ref 150–400)
RBC: 4.39 MIL/uL (ref 3.87–5.11)
RDW: 13.4 % (ref 11.5–15.5)
WBC: 5.1 10*3/uL (ref 4.0–10.5)

## 2017-12-31 LAB — BASIC METABOLIC PANEL
Anion gap: 9 (ref 5–15)
BUN: 13 mg/dL (ref 8–23)
CHLORIDE: 99 mmol/L (ref 98–111)
CO2: 28 mmol/L (ref 22–32)
CREATININE: 0.85 mg/dL (ref 0.44–1.00)
Calcium: 9.1 mg/dL (ref 8.9–10.3)
GFR calc Af Amer: >60 mL/min (ref >60)
GFR calc non Af Amer: >60 mL/min (ref >60)
Glucose, Bld: 99 mg/dL (ref 70–99)
Potassium: 3.6 mmol/L (ref 3.5–5.1)
SODIUM: 136 mmol/L (ref 135–145)

## 2017-12-31 NOTE — Pre-Procedure Instructions (Signed)
DR. Tobias Alexander VIEWED EKG, AWARE OF HISTORY

## 2018-01-06 ENCOUNTER — Other Ambulatory Visit: Payer: Self-pay

## 2018-01-06 ENCOUNTER — Ambulatory Visit (HOSPITAL_COMMUNITY): Payer: Medicare Other | Admitting: Anesthesiology

## 2018-01-06 ENCOUNTER — Encounter (HOSPITAL_COMMUNITY): Payer: Self-pay

## 2018-01-06 ENCOUNTER — Ambulatory Visit (HOSPITAL_COMMUNITY)
Admission: AD | Admit: 2018-01-06 | Discharge: 2018-01-06 | Disposition: A | Discharge status: Home / Self Care | Payer: Medicare Other | Source: Ambulatory Visit | Attending: Obstetrics and Gynecology | Admitting: Obstetrics and Gynecology

## 2018-01-06 ENCOUNTER — Encounter (HOSPITAL_COMMUNITY)
Admission: AD | Disposition: A | Discharge status: Home / Self Care | Payer: Self-pay | Source: Ambulatory Visit | Attending: Obstetrics and Gynecology

## 2018-01-06 DIAGNOSIS — K219 Gastro-esophageal reflux disease without esophagitis: Secondary | ICD-10-CM | POA: Insufficient documentation

## 2018-01-06 DIAGNOSIS — Z96641 Presence of right artificial hip joint: Secondary | ICD-10-CM | POA: Insufficient documentation

## 2018-01-06 DIAGNOSIS — E119 Type 2 diabetes mellitus without complications: Secondary | ICD-10-CM | POA: Diagnosis not present

## 2018-01-06 DIAGNOSIS — Z9851 Tubal ligation status: Secondary | ICD-10-CM | POA: Diagnosis not present

## 2018-01-06 DIAGNOSIS — E7800 Pure hypercholesterolemia, unspecified: Secondary | ICD-10-CM | POA: Insufficient documentation

## 2018-01-06 DIAGNOSIS — I10 Essential (primary) hypertension: Secondary | ICD-10-CM | POA: Diagnosis not present

## 2018-01-06 DIAGNOSIS — F419 Anxiety disorder, unspecified: Secondary | ICD-10-CM | POA: Insufficient documentation

## 2018-01-06 DIAGNOSIS — Z79899 Other long term (current) drug therapy: Secondary | ICD-10-CM | POA: Diagnosis not present

## 2018-01-06 DIAGNOSIS — E785 Hyperlipidemia, unspecified: Secondary | ICD-10-CM | POA: Insufficient documentation

## 2018-01-06 DIAGNOSIS — J449 Chronic obstructive pulmonary disease, unspecified: Secondary | ICD-10-CM | POA: Diagnosis not present

## 2018-01-06 DIAGNOSIS — I2510 Atherosclerotic heart disease of native coronary artery without angina pectoris: Secondary | ICD-10-CM | POA: Insufficient documentation

## 2018-01-06 DIAGNOSIS — Z7982 Long term (current) use of aspirin: Secondary | ICD-10-CM | POA: Diagnosis not present

## 2018-01-06 DIAGNOSIS — N858 Other specified noninflammatory disorders of uterus: Secondary | ICD-10-CM | POA: Diagnosis present

## 2018-01-06 DIAGNOSIS — J45909 Unspecified asthma, uncomplicated: Secondary | ICD-10-CM | POA: Diagnosis not present

## 2018-01-06 DIAGNOSIS — N840 Polyp of corpus uteri: Secondary | ICD-10-CM | POA: Insufficient documentation

## 2018-01-06 HISTORY — PX: DILATATION & CURETTAGE/HYSTEROSCOPY WITH MYOSURE: SHX6511

## 2018-01-06 LAB — GLUCOSE, CAPILLARY: GLUCOSE-CAPILLARY: 100 mg/dL — AB (ref 70–99)

## 2018-01-06 SURGERY — DILATATION & CURETTAGE/HYSTEROSCOPY WITH MYOSURE
Anesthesia: General

## 2018-01-06 MED ORDER — FENTANYL CITRATE (PF) 100 MCG/2ML IJ SOLN
25.0000 ug | INTRAMUSCULAR | Status: DC | PRN
  Administered 2018-01-06: 25 ug via INTRAVENOUS

## 2018-01-06 MED ORDER — PHENYLEPHRINE 40 MCG/ML (10ML) SYRINGE FOR IV PUSH (FOR BLOOD PRESSURE SUPPORT)
PREFILLED_SYRINGE | INTRAVENOUS | Status: AC
  Filled 2018-01-06: qty 10

## 2018-01-06 MED ORDER — DEXAMETHASONE SODIUM PHOSPHATE 10 MG/ML IJ SOLN
INTRAMUSCULAR | Status: DC | PRN
  Administered 2018-01-06: 4 mg via INTRAVENOUS

## 2018-01-06 MED ORDER — EPHEDRINE 5 MG/ML INJ
INTRAVENOUS | Status: AC
  Filled 2018-01-06: qty 10

## 2018-01-06 MED ORDER — ONDANSETRON HCL 4 MG/2ML IJ SOLN
INTRAMUSCULAR | Status: AC
  Filled 2018-01-06: qty 2

## 2018-01-06 MED ORDER — ONDANSETRON HCL 4 MG/2ML IJ SOLN: 4.0000 mg | Freq: Once | INTRAMUSCULAR | Status: DC | PRN

## 2018-01-06 MED ORDER — ONDANSETRON HCL 4 MG/2ML IJ SOLN
INTRAMUSCULAR | Status: DC | PRN
  Administered 2018-01-06: 4 mg via INTRAVENOUS

## 2018-01-06 MED ORDER — FENTANYL CITRATE (PF) 100 MCG/2ML IJ SOLN
INTRAMUSCULAR | Status: AC
Start: 2018-01-06 — End: ?
  Filled 2018-01-06: qty 2

## 2018-01-06 MED ORDER — FENTANYL CITRATE (PF) 100 MCG/2ML IJ SOLN
INTRAMUSCULAR | Status: AC
  Administered 2018-01-06: 25 ug via INTRAVENOUS
  Filled 2018-01-06: qty 2

## 2018-01-06 MED ORDER — PHENYLEPHRINE HCL 10 MG/ML IJ SOLN
INTRAMUSCULAR | Status: DC | PRN
  Administered 2018-01-06: 40 ug via INTRAVENOUS

## 2018-01-06 MED ORDER — PROPOFOL 10 MG/ML IV BOLUS
INTRAVENOUS | Status: AC
  Filled 2018-01-06: qty 20

## 2018-01-06 MED ORDER — LACTATED RINGERS IV SOLN
INTRAVENOUS | Status: DC
  Administered 2018-01-06: 125 mL/h via INTRAVENOUS

## 2018-01-06 MED ORDER — DEXAMETHASONE SODIUM PHOSPHATE 4 MG/ML IJ SOLN
INTRAMUSCULAR | Status: AC
  Filled 2018-01-06: qty 1

## 2018-01-06 MED ORDER — PROPOFOL 10 MG/ML IV BOLUS
INTRAVENOUS | Status: DC | PRN
  Administered 2018-01-06: 100 mg via INTRAVENOUS
  Administered 2018-01-06: 50 mg via INTRAVENOUS

## 2018-01-06 MED ORDER — LIDOCAINE HCL (CARDIAC) PF 100 MG/5ML IV SOSY
PREFILLED_SYRINGE | INTRAVENOUS | Status: DC | PRN
  Administered 2018-01-06: 60 mg via INTRAVENOUS

## 2018-01-06 MED ORDER — EPHEDRINE SULFATE 50 MG/ML IJ SOLN
INTRAMUSCULAR | Status: DC | PRN
  Administered 2018-01-06 (×2): 10 mg via INTRAVENOUS

## 2018-01-06 MED ORDER — LIDOCAINE HCL (CARDIAC) PF 100 MG/5ML IV SOSY
PREFILLED_SYRINGE | INTRAVENOUS | Status: AC
  Filled 2018-01-06: qty 5

## 2018-01-06 SURGICAL SUPPLY — 16 items
CANISTER SUCT 3000ML PPV (MISCELLANEOUS) ×2 IMPLANT
CATH ROBINSON RED A/P 16FR (CATHETERS) ×2 IMPLANT
DEVICE MYOSURE LITE (MISCELLANEOUS) IMPLANT
DEVICE MYOSURE REACH (MISCELLANEOUS) ×2 IMPLANT
DILATOR CANAL MILEX (MISCELLANEOUS) ×2 IMPLANT
FILTER ARTHROSCOPY CONVERTOR (FILTER) ×2 IMPLANT
GLOVE BIOGEL PI IND STRL 7.0 (GLOVE) ×2 IMPLANT
GLOVE BIOGEL PI INDICATOR 7.0 (GLOVE) ×2
GLOVE ECLIPSE 6.5 STRL STRAW (GLOVE) ×2 IMPLANT
GOWN STRL REUS W/TWL LRG LVL3 (GOWN DISPOSABLE) ×4 IMPLANT
PACK VAGINAL MINOR WOMEN LF (CUSTOM PROCEDURE TRAY) ×2 IMPLANT
PAD OB MATERNITY 4.3X12.25 (PERSONAL CARE ITEMS) ×2 IMPLANT
SEAL ROD LENS SCOPE MYOSURE (ABLATOR) ×2 IMPLANT
TOWEL OR 17X24 6PK STRL BLUE (TOWEL DISPOSABLE) ×4 IMPLANT
TUBING AQUILEX INFLOW (TUBING) ×2 IMPLANT
TUBING AQUILEX OUTFLOW (TUBING) ×2 IMPLANT

## 2018-01-06 NOTE — Op Note (Signed)
NAME: Paula Pollard, Paula Pollard MEDICAL RECORD [id number] ACCOUNT [id number] DATE OF BIRTH:06-02-1930 FACILITY: [facility] LOCATION: [facility] PHYSICIAN:Tumeka Chimenti A. Aireanna Luellen, MD  OPERATIVE REPORT  DATE OF PROCEDURE:  01/06/2018  PREOPERATIVE DIAGNOSIS:  Endometrial mass, pelvic pain.  PROCEDURE:  Diagnostic hysteroscopy, hysteroscopic resection of endometrial polyp, dilation and curettage.  POSTOPERATIVE DIAGNOSIS:  Endometrial polyp.  ANESTHESIA:  General.  SURGEON:  Servando Salina, MD  ASSISTANT:  None.  DESCRIPTION OF PROCEDURE:  Under adequate general anesthesia, the patient was placed in the dorsal lithotomy position.  She was sterilely prepped and draped in the usual fashion.  The bladder was catheterized for a moderate amount of urine.  The patient has a urethra that has either prolapsed mucosa or a small polyp.  A small bivalve speculum was placed in the vagina.  A single-tooth tenaculum was placed on the anterior lip of the cervix.  The cervix was then carefully dilated up to a #23 Pratt dilator. A diagnostic hysteroscope was introduced into the uterine cavity.  A large posterior endometrial mass was noted.  Both tubal ostia were seen.  The REACH MyoSure resectoscope was then inserted.  The mass was resected in its entirety, and the endometrium which was atrophic was also resected.  The endocervical canal was inspected.  A small polypoid lesion was removed. All instruments were then removed from the vagina.  The specimen labeled endometrial polyp with endometrial curettings were sent to pathology.  Fluid deficit was 150 ml.  COMPLICATIONS:  None. EBL: minimal The patient tolerated the procedure well and was transferred to recovery room in stable condition.  LN/NUANCE  D:01/06/2018 T:01/06/2018 JOB:[id number]

## 2018-01-06 NOTE — Discharge Instructions (Signed)

## 2018-01-06 NOTE — Anesthesia Postprocedure Evaluation (Signed)
Anesthesia Post Note  Patient: Paula Pollard  Procedure(s) Performed: DILATATION & CURETTAGE/HYSTEROSCOPY WITH MYOSURE (N/A )     Patient location during evaluation: PACU Anesthesia Type: General Level of consciousness: awake and alert Pain management: pain level controlled Vital Signs Assessment: post-procedure vital signs reviewed and stable Respiratory status: spontaneous breathing, nonlabored ventilation, respiratory function stable and patient connected to nasal cannula oxygen Cardiovascular status: blood pressure returned to baseline and stable Postop Assessment: no apparent nausea or vomiting Anesthetic complications: no    Last Vitals:  Vitals:   01/06/18 1330 01/06/18 1415  BP: (!) 148/73 (!) 142/70  Pulse: 70 72  Resp: 16 16  Temp:  36.4 C  SpO2: 99% 100%    Last Pain:  Vitals:   01/06/18 1415  TempSrc:   PainSc: 0-No pain   Pain Goal: Patients Stated Pain Goal: 3 (01/06/18 1248)               Ryan P Ellender

## 2018-01-06 NOTE — Anesthesia Procedure Notes (Signed)
Procedure Name: LMA Insertion Date/Time: 01/06/2018 11:37 AM Performed by: Hewitt Blade, CRNA Pre-anesthesia Checklist: Patient identified, Emergency Drugs available, Suction available and Patient being monitored Patient Re-evaluated:Patient Re-evaluated prior to induction Oxygen Delivery Method: Circle system utilized Preoxygenation: Pre-oxygenation with 100% oxygen Induction Type: IV induction LMA: LMA inserted LMA Size: 3.0 Number of attempts: 1 Placement Confirmation: positive ETCO2 and breath sounds checked- equal and bilateral Tube secured with: Tape Dental Injury: Teeth and Oropharynx as per pre-operative assessment

## 2018-01-06 NOTE — Anesthesia Preprocedure Evaluation (Addendum)
Anesthesia Evaluation  Patient identified by MRN, date of birth, ID band Patient awake    Reviewed: Allergy & Precautions, NPO status , Patient's Chart, lab work & pertinent test results, reviewed documented beta blocker date and time   Airway Mallampati: II  TM Distance: >3 FB Neck ROM: Full    Dental no notable dental hx. (+) Missing, Upper Dentures,    Pulmonary asthma ,    Pulmonary exam normal breath sounds clear to auscultation       Cardiovascular hypertension, Pt. on home beta blockers and Pt. on medications + CAD  Normal cardiovascular exam Rhythm:Regular Rate:Normal  ECG: NSR, LAD, LBBB  Sees cardiologist Lyla Son)  2017 1. No reversible ischemia or infarction. 2. Normal left ventricular wall motion. 3. Left ventricular ejection fraction is 64%. 4. Non invasive risk stratification*: Low   Neuro/Psych  Headaches, PSYCHIATRIC DISORDERS Anxiety    GI/Hepatic Neg liver ROS, hiatal hernia, GERD  Medicated and Controlled,  Endo/Other  diabetes  Renal/GU negative Renal ROS     Musculoskeletal negative musculoskeletal ROS (+)   Abdominal   Peds  Hematology  (+) anemia , HLD   Anesthesia Other Findings Endometrial Mass  Pelvic Pain  Reproductive/Obstetrics                           Anesthesia Physical Anesthesia Plan  ASA: III  Anesthesia Plan: General   Post-op Pain Management:    Induction: Intravenous  PONV Risk Score and Plan: 3 and Ondansetron and Dexamethasone  Airway Management Planned: LMA  Additional Equipment:   Intra-op Plan:   Post-operative Plan: Extubation in OR  Informed Consent: I have reviewed the patients History and Physical, chart, labs and discussed the procedure including the risks, benefits and alternatives for the proposed anesthesia with the patient or authorized representative who has indicated his/her understanding and acceptance.   Dental  advisory given  Plan Discussed with: CRNA  Anesthesia Plan Comments:        Anesthesia Quick Evaluation

## 2018-01-06 NOTE — Brief Op Note (Signed)
01/06/2018  12:20 PM  PATIENT:  [name]  84 y.o. female  PRE-OPERATIVE DIAGNOSIS:  Endometrial Mass, Pelvic Pain  POST-OPERATIVE DIAGNOSIS: , Pelvic Pain, endometrial polyp  PROCEDURE:  Diagnostic hysteroscopy, hysteroscopic resection of endometrial polyp, D&C  SURGEON:  Surgeon(s) and Role:    * Servando Salina, MD - Primary  PHYSICIAN ASSISTANT:   ASSISTANTS: none   ANESTHESIA:   general Findings: post endom polyp, tubal ostia seen, atrophic endometrium EBL:  5 mL   BLOOD ADMINISTERED:none  DRAINS: none   LOCAL MEDICATIONS USED:  NONE  SPECIMEN:  Source of Specimen:  endometrial polyp with endom curetting  DISPOSITION OF SPECIMEN:  PATHOLOGY  COUNTS:  YES  TOURNIQUET:  * No tourniquets in log *  DICTATION: .Other Dictation: Dictation Number [id number]  PLAN OF CARE: Discharge to home after PACU  PATIENT DISPOSITION:  PACU - hemodynamically stable.   Delay start of Pharmacological VTE agent (>24hrs) due to surgical blood loss or risk of bleeding: no

## 2018-01-06 NOTE — Progress Notes (Signed)
25 mcg Fentanyl adm at 1248, 63mcg Fentanyl wasted with Joyce Copa RN. Pharmacy informed, spoke with Seth Bake, documentation completed on Pharmacy Report with Joyce Copa RN per Andrea's instruction.

## 2018-01-06 NOTE — Transfer of Care (Signed)
Immediate Anesthesia Transfer of Care Note  Patient: Paula Pollard  Procedure(s) Performed: DILATATION & CURETTAGE/HYSTEROSCOPY WITH MYOSURE (N/A )  Patient Location: PACU  Anesthesia Type:General  Level of Consciousness: awake, alert  and oriented  Airway & Oxygen Therapy: Patient Spontanous Breathing and Patient connected to nasal cannula oxygen  Post-op Assessment: Report given to RN, Post -op Vital signs reviewed and stable and Patient moving all extremities  Post vital signs: Reviewed and stable  Last Vitals:  Vitals Value Taken Time  BP 145/70 01/06/2018 12:22 PM  Temp    Pulse 80 01/06/2018 12:25 PM  Resp 16 01/06/2018 12:25 PM  SpO2 100 % 01/06/2018 12:25 PM  Vitals shown include unvalidated device data.  Last Pain:  Vitals:   01/06/18 0951  TempSrc: Oral  PainSc: 5       Patients Stated Pain Goal: 2 (21/11/55 2080)  Complications: No apparent anesthesia complications

## 2018-01-07 ENCOUNTER — Encounter (HOSPITAL_COMMUNITY): Payer: Self-pay | Admitting: Obstetrics and Gynecology

## 2018-01-10 DIAGNOSIS — I10 Essential (primary) hypertension: Secondary | ICD-10-CM | POA: Diagnosis not present

## 2018-01-10 DIAGNOSIS — E559 Vitamin D deficiency, unspecified: Secondary | ICD-10-CM | POA: Diagnosis not present

## 2018-01-10 DIAGNOSIS — Z8673 Personal history of transient ischemic attack (TIA), and cerebral infarction without residual deficits: Secondary | ICD-10-CM | POA: Diagnosis not present

## 2018-01-10 DIAGNOSIS — E785 Hyperlipidemia, unspecified: Secondary | ICD-10-CM | POA: Diagnosis not present

## 2018-01-10 DIAGNOSIS — M1990 Unspecified osteoarthritis, unspecified site: Secondary | ICD-10-CM | POA: Diagnosis not present

## 2018-01-10 DIAGNOSIS — I340 Nonrheumatic mitral (valve) insufficiency: Secondary | ICD-10-CM | POA: Diagnosis not present

## 2018-01-10 DIAGNOSIS — J45909 Unspecified asthma, uncomplicated: Secondary | ICD-10-CM | POA: Diagnosis not present

## 2018-01-10 DIAGNOSIS — I2510 Atherosclerotic heart disease of native coronary artery without angina pectoris: Secondary | ICD-10-CM | POA: Diagnosis not present

## 2018-01-17 DIAGNOSIS — Z1231 Encounter for screening mammogram for malignant neoplasm of breast: Secondary | ICD-10-CM | POA: Diagnosis not present

## 2018-01-17 DIAGNOSIS — M8588 Other specified disorders of bone density and structure, other site: Secondary | ICD-10-CM | POA: Diagnosis not present

## 2018-01-17 DIAGNOSIS — Z803 Family history of malignant neoplasm of breast: Secondary | ICD-10-CM | POA: Diagnosis not present

## 2018-02-01 ENCOUNTER — Other Ambulatory Visit: Payer: Self-pay | Admitting: Allergy and Immunology

## 2018-02-16 DIAGNOSIS — N182 Chronic kidney disease, stage 2 (mild): Secondary | ICD-10-CM | POA: Diagnosis not present

## 2018-02-16 DIAGNOSIS — I129 Hypertensive chronic kidney disease with stage 1 through stage 4 chronic kidney disease, or unspecified chronic kidney disease: Secondary | ICD-10-CM | POA: Diagnosis not present

## 2018-02-16 DIAGNOSIS — Z23 Encounter for immunization: Secondary | ICD-10-CM | POA: Diagnosis not present

## 2018-02-16 DIAGNOSIS — Z800 Family history of malignant neoplasm of digestive organs: Secondary | ICD-10-CM | POA: Diagnosis not present

## 2018-02-16 DIAGNOSIS — R7309 Other abnormal glucose: Secondary | ICD-10-CM | POA: Diagnosis not present

## 2018-03-14 ENCOUNTER — Ambulatory Visit (INDEPENDENT_AMBULATORY_CARE_PROVIDER_SITE_OTHER): Payer: Medicare Other | Admitting: Allergy and Immunology

## 2018-03-14 ENCOUNTER — Encounter: Payer: Self-pay | Admitting: Allergy and Immunology

## 2018-03-14 VITALS — BP 106/62 | HR 72 | Temp 97.5°F | Resp 20 | Ht 63.0 in | Wt 170.0 lb

## 2018-03-14 DIAGNOSIS — J3089 Other allergic rhinitis: Secondary | ICD-10-CM | POA: Diagnosis not present

## 2018-03-14 DIAGNOSIS — J0100 Acute maxillary sinusitis, unspecified: Secondary | ICD-10-CM

## 2018-03-14 DIAGNOSIS — J4531 Mild persistent asthma with (acute) exacerbation: Secondary | ICD-10-CM | POA: Diagnosis not present

## 2018-03-14 MED ORDER — FLUTICASONE PROPIONATE HFA 110 MCG/ACT IN AERO: 2.0000 | INHALATION_SPRAY | Freq: Two times a day (BID) | RESPIRATORY_TRACT | 5 refills | Status: DC

## 2018-03-14 MED ORDER — DOXYCYCLINE HYCLATE 100 MG PO CAPS: ORAL_CAPSULE | ORAL | 0 refills | Status: DC

## 2018-03-14 MED ORDER — AZELASTINE HCL 0.1 % NA SOLN: NASAL | 5 refills | Status: DC

## 2018-03-14 NOTE — Assessment & Plan Note (Addendum)
   Prednisone has been provided (as above).  For now, and during respiratory tract infections or asthma flares, add Flovent 110g 2 inhalations 2 times per day until symptoms have returned to baseline.  To maximize pulmonary deposition, a spacer has been provided along with instructions for its proper administration with an HFA inhaler.  Continue albuterol HFA, 1 to 2 inhalations every 4-6 hours if needed.  The patient has been asked to contact me if her symptoms persist or progress. Otherwise, she may return for follow up in 5 months.

## 2018-03-14 NOTE — Assessment & Plan Note (Signed)
   Continue appropriate allergen avoidance measures.  Treatment plan as outlined above for acute sinusitis. 

## 2018-03-14 NOTE — Patient Instructions (Addendum)
Acute sinusitis  Prednisone has been provided, 20 mg x 4 days, 10 mg x1 day, then stop.  A prescription has been provided for doxycycline 100 mg twice daily for 7 days.  A prescription has been provided for azelastine nasal spray, 1-2 sprays per nostril 2 times daily as needed. Proper nasal spray technique has been discussed and demonstrated.   Nasal saline lavage (NeilMed) has been recommended as needed and prior to medicated nasal sprays along with instructions for proper administration.  For thick post nasal drainage, add guaifenesin 600 mg (Mucinex)  twice daily as needed with adequate hydration as discussed.  Asthma with acute exacerbation  Prednisone has been provided (as above).  For now, and during respiratory tract infections or asthma flares, add Flovent 110g 2 inhalations 2 times per day until symptoms have returned to baseline.  To maximize pulmonary deposition, a spacer has been provided along with instructions for its proper administration with an HFA inhaler.  Continue albuterol HFA, 1 to 2 inhalations every 4-6 hours if needed.  The patient has been asked to contact me if her symptoms persist or progress. Otherwise, she may return for follow up in 5 months.  Other allergic rhinitis  Continue appropriate allergen avoidance measures.  Treatment plan as outlined above for acute sinusitis.   Return in about 5 months (around 08/13/2018), or if symptoms worsen or fail to improve.

## 2018-03-14 NOTE — Assessment & Plan Note (Addendum)
   Prednisone has been provided, 20 mg x 4 days, 10 mg x1 day, then stop.  A prescription has been provided for doxycycline 100 mg twice daily for 7 days.  A prescription has been provided for azelastine nasal spray, 1-2 sprays per nostril 2 times daily as needed. Proper nasal spray technique has been discussed and demonstrated.   Nasal saline lavage (NeilMed) has been recommended as needed and prior to medicated nasal sprays along with instructions for proper administration.  For thick post nasal drainage, add guaifenesin 600 mg (Mucinex)  twice daily as needed with adequate hydration as discussed.

## 2018-03-14 NOTE — Progress Notes (Signed)
Follow-up Note  RE: Paula Pollard MRN: [id number] DOB: Jun 04, 1930 Date of Office Visit: 03/14/2018  Primary care provider: Glendale Chard, MD Referring provider: Glendale Chard, MD  History of present illness: Paula Pollard is a 84 y.o. female with intermittent asthma and allergic rhinoconjunctivitis presenting today for sick visit.  She was last seen in this clinic on October 05, 2017.  She complains that over the past 8 days she has experienced persistent nasal congestion, sinus pressure, as well as chest tightness, wheezing, and coughing.  She reports that she received her pneumococcal vaccination on September 11 and feels like her respiratory symptoms began at that time and have progressed over the past month, particularly in the last week.  She reports that she has been hot and sweaty at night, however is uncertain if she has been febrile.  She denies fetid mucus production.  Assessment and plan: Acute sinusitis  Prednisone has been provided, 20 mg x 4 days, 10 mg x1 day, then stop.  A prescription has been provided for doxycycline 100 mg twice daily for 7 days.  A prescription has been provided for azelastine nasal spray, 1-2 sprays per nostril 2 times daily as needed. Proper nasal spray technique has been discussed and demonstrated.   Nasal saline lavage (NeilMed) has been recommended as needed and prior to medicated nasal sprays along with instructions for proper administration.  For thick post nasal drainage, add guaifenesin 600 mg (Mucinex)  twice daily as needed with adequate hydration as discussed.  Asthma with acute exacerbation  Prednisone has been provided (as above).  For now, and during respiratory tract infections or asthma flares, add Flovent 110g 2 inhalations 2 times per day until symptoms have returned to baseline.  To maximize pulmonary deposition, a spacer has been provided along with instructions for its proper administration with an HFA  inhaler.  Continue albuterol HFA, 1 to 2 inhalations every 4-6 hours if needed.  The patient has been asked to contact me if her symptoms persist or progress. Otherwise, she may return for follow up in 5 months.  Other allergic rhinitis  Continue appropriate allergen avoidance measures.  Treatment plan as outlined above for acute sinusitis.   Meds ordered this encounter  Medications  . doxycycline (VIBRAMYCIN) 100 MG capsule    Sig: Take 1 capsule by mouth twice a day for 7 days    Dispense:  14 capsule    Refill:  0  . azelastine (ASTELIN) 0.1 % nasal spray    Sig: Use 1-2 sprays per nostril twice daily    Dispense:  30 mL    Refill:  5  . fluticasone (FLOVENT HFA) 110 MCG/ACT inhaler    Sig: Inhale 2 puffs into the lungs 2 (two) times daily.    Dispense:  1 Inhaler    Refill:  5    Diagnostics: Spirometry reveals an FVC of 1.23 L and an FEV1 of 0.96 L (69% predicted) with significant (310 mL, 32%) postbronchodilator improvement.  Please see scanned spirometry results for details.    Physical examination: Blood pressure 106/62, pulse 72, temperature (!) 97.5 F (36.4 C), temperature source Oral, resp. rate 20, height 5\' 3"  (1.6 m), weight 170 lb (77.1 kg), SpO2 95 %.  General: Alert, interactive, in no acute distress. HEENT: TMs pearly gray, turbinates edematous with thick discharge, post-pharynx moderately erythematous. Neck: Supple without lymphadenopathy. Lungs: Mildly decreased breath sounds bilaterally without wheezing, rhonchi or rales. CV: Normal S1, S2 without murmurs. Skin: Warm and dry,  without lesions or rashes.  The following portions of the patient's history were reviewed and updated as appropriate: allergies, current medications, past family history, past medical history, past social history, past surgical history and problem list.  Allergies as of 03/14/2018      Reactions   Codeine Nausea Only, Other (See Comments)   Reaction:Dizziness and  hallucinations "makes me climb walls"      Medication List        Accurate as of 03/14/18 11:52 PM. Always use your most recent med list.          aspirin EC 81 MG tablet Take 81 mg by mouth daily.   atorvastatin 10 MG tablet Commonly known as:  LIPITOR Take 10 mg by mouth every evening.   azelastine 0.1 % nasal spray Commonly known as:  ASTELIN Use 1-2 sprays per nostril twice daily   B-12 PO Take 1 tablet by mouth as needed (for energy).   BREO ELLIPTA 200-25 MCG/INH Aepb Generic drug:  fluticasone furoate-vilanterol Inhale 1 puff into the lungs daily as needed (shortness of breath).   Calcium 200 MG Tabs Take 200 mg by mouth daily.   cholecalciferol 400 units Tabs tablet Commonly known as:  VITAMIN D Take 1,200 Units by mouth daily.   doxycycline 100 MG capsule Commonly known as:  VIBRAMYCIN Take 1 capsule by mouth twice a day for 7 days   ECHINACEA ACZ PO Take 1 capsule by mouth as needed (for immune support).   esomeprazole 20 MG capsule Commonly known as:  NEXIUM Take 20 mg by mouth as needed (for heartburn).   fexofenadine 180 MG tablet Commonly known as:  ALLEGRA Take 180 mg by mouth daily as needed for allergies.   fluticasone 110 MCG/ACT inhaler Commonly known as:  FLOVENT HFA Inhale 2 puffs into the lungs 2 (two) times daily.   fluticasone 50 MCG/ACT nasal spray Commonly known as:  FLONASE Place 2 sprays into both nostrils daily as needed for allergies or rhinitis.   LORazepam 0.5 MG tablet Commonly known as:  ATIVAN Take 0.5 mg by mouth 2 (two) times daily.   meclizine 25 MG tablet Commonly known as:  ANTIVERT Take 0.5 tablets (12.5 mg total) by mouth every morning.   nebivolol 5 MG tablet Commonly known as:  BYSTOLIC Take 1 tablet (5 mg total) by mouth daily.   SALINE NASAL SPRAY NA Place 1 puff into the nose at bedtime.   SYSTANE 0.4-0.3 % Soln Generic drug:  Polyethyl Glycol-Propyl Glycol Apply 1 drop to eye daily.    telmisartan 20 MG tablet Commonly known as:  MICARDIS Take 20 mg by mouth daily.   triamterene-hydrochlorothiazide 37.5-25 MG tablet Commonly known as:  MAXZIDE-25 Take 1 each (1 tablet total) by mouth every morning.   VENTOLIN HFA 108 (90 Base) MCG/ACT inhaler Generic drug:  albuterol TAKE 2 PUFFS BY MOUTH EVERY 6 HOURS AS NEEDED FOR WHEEZE OR SHORTNESS OF BREATH   VITAMIN C PO Take 1 tablet by mouth daily.       Allergies  Allergen Reactions  . Codeine Nausea Only and Other (See Comments)    Reaction:Dizziness and hallucinations "makes me climb walls"   Review of systems: Review of systems negative except as noted in HPI / PMHx or noted below: Constitutional: Negative.  HENT: Negative.   Eyes: Negative.  Respiratory: Negative.   Cardiovascular: Negative.  Gastrointestinal: Negative.  Genitourinary: Negative.  Musculoskeletal: Negative.  Neurological: Negative.  Endo/Heme/Allergies: Negative.  Cutaneous: Negative.  Past Medical History:  Diagnosis Date  . Anemia    "in past as a young girl"  . Anxiety   . Arthritis   . Asthma   . Bronchitis    hx of  . Cataract    bilaterally, "no surgery at this time"  . Diabetes mellitus without complication ([place])    pre diabetic  . Dysrhythmia    when gets excited  . GERD (gastroesophageal reflux disease)   . Headache(784.0)    hx of migraines  . History of hiatal hernia   . Hyperlipidemia   . Hypertension    sees Dr. Terrence Dupont  . Neuromuscular disorder ([place])    hx of carpal tunnel "never had surgery for"right hand    History reviewed. No pertinent family history.  Social History   Socioeconomic History  . Marital status: Divorced    Spouse name: Not on file  . Number of children: Not on file  . Years of education: Not on file  . Highest education level: Not on file  Occupational History  . Not on file  Social Needs  . Financial resource strain: Not on file  . Food insecurity:    Worry: Not on file     Inability: Not on file  . Transportation needs:    Medical: Not on file    Non-medical: Not on file  Tobacco Use  . Smoking status: Never Smoker  . Smokeless tobacco: Never Used  Substance and Sexual Activity  . Alcohol use: No  . Drug use: No  . Sexual activity: Not on file  Lifestyle  . Physical activity:    Days per week: Not on file    Minutes per session: Not on file  . Stress: Not on file  Relationships  . Social connections:    Talks on phone: Not on file    Gets together: Not on file    Attends religious service: Not on file    Active member of club or organization: Not on file    Attends meetings of clubs or organizations: Not on file    Relationship status: Not on file  . Intimate partner violence:    Fear of current or ex partner: Not on file    Emotionally abused: Not on file    Physically abused: Not on file    Forced sexual activity: Not on file  Other Topics Concern  . Not on file  Social History Narrative  . Not on file    I appreciate the opportunity to take part in [place]'s care. Please do not hesitate to contact me with questions.  Sincerely,   R. Edgar Frisk, MD

## 2018-04-06 ENCOUNTER — Ambulatory Visit (INDEPENDENT_AMBULATORY_CARE_PROVIDER_SITE_OTHER): Payer: Medicare Other

## 2018-04-06 VITALS — BP 142/70 | HR 77 | Temp 98.0°F | Ht 63.0 in | Wt 170.0 lb

## 2018-04-06 DIAGNOSIS — Z0000 Encounter for general adult medical examination without abnormal findings: Secondary | ICD-10-CM | POA: Diagnosis not present

## 2018-04-06 NOTE — Progress Notes (Signed)
Subjective:   Paula Pollard is a 84 y.o. female who presents for Medicare Annual (Subsequent) preventive examination.   Review of Systems:  n/a Cardiac Risk Factors include: advanced age (>3men, >40 women);hypertension;obesity (BMI >30kg/m2);family history of premature cardiovascular disease     Objective:     Vitals: BP (!) 142/70 (BP Location: Left Arm)   Pulse 77   Temp 98 F (36.7 C)   Ht 5\' 3"  (1.6 m)   Wt 170 lb (77.1 kg)   BMI 30.11 kg/m   Body mass index is 30.11 kg/m.  Advanced Directives 04/06/2018 12/31/2017 02/26/2015 12/26/2014 08/10/2012 07/25/2012  Does Patient Have a Medical Advance Directive? No No Yes No Patient has advance directive, copy not in chart Patient has advance directive, copy not in chart  Type of Advance Directive - - [place];Living will - - Living will  Copy of [place] in Chart? - - - - Copy requested from other (Comment) -  Would patient like information on creating a medical advance directive? No - Patient declined Yes (MAU/Ambulatory/Procedural Areas - Information given) - - - -    Tobacco Social History   Tobacco Use  Smoking Status Never Smoker  Smokeless Tobacco Never Used     Counseling given: Not Answered   Clinical Intake:  Pre-visit preparation completed: Yes  Pain : No/denies pain Pain Score: 0-No pain     Nutritional Status: BMI > 30  Obese Nutritional Risks: Nausea/ vomitting/ diarrhea(since pneumonia vaccine felt nausea) Diabetes: No  What is the last grade level you completed in school?: some college  Interpreter Needed?: No  Information entered by :: NAllen LPN  Past Medical History:  Diagnosis Date  . Anemia    "in past as a young girl"  . Anxiety   . Arthritis   . Asthma   . Bronchitis    hx of  . Cataract    bilaterally, "no surgery at this time"  . Diabetes mellitus without complication ([place])    pre diabetic  . Dysrhythmia    when gets excited  .  GERD (gastroesophageal reflux disease)   . Headache(784.0)    hx of migraines  . History of hiatal hernia   . Hyperlipidemia   . Hypertension    sees Dr. Terrence Dupont  . Neuromuscular disorder ([place])    hx of carpal tunnel "never had surgery for"right hand   Past Surgical History:  Procedure Laterality Date  . CARDIAC CATHETERIZATION     5 years ago no problems  . DILATATION & CURETTAGE/HYSTEROSCOPY WITH MYOSURE N/A 01/06/2018   Procedure: DILATATION & CURETTAGE/HYSTEROSCOPY WITH MYOSURE;  Surgeon: Servando Salina, MD;  Location: [facility];  Service: Gynecology;  Laterality: N/A;  . DOPPLER ECHOCARDIOGRAPHY     hx of  . NECK SURGERY     2002, cyst removed,   . removal of toe nails     both feet  . TOTAL HIP ARTHROPLASTY Right 08/08/2012   Dr Mayer Camel  . TOTAL HIP ARTHROPLASTY Right 08/08/2012   Procedure: TOTAL HIP ARTHROPLASTY;  Surgeon: Kerin Salen, MD;  Location: [facility];  Service: Orthopedics;  Laterality: Right;  . TUBAL LIGATION     1970   Family History  Problem Relation Age of Onset  . Heart attack Mother   . Heart attack Father   . Stroke Father   . Stroke Daughter   . Colon cancer Sister    Social History   Socioeconomic History  . Marital status:  Divorced    Spouse name: Not on file  . Number of children: Not on file  . Years of education: Not on file  . Highest education level: Not on file  Occupational History  . Occupation: retired  Scientific laboratory technician  . Financial resource strain: Not hard at all  . Food insecurity:    Worry: Never true    Inability: Never true  . Transportation needs:    Medical: No    Non-medical: No  Tobacco Use  . Smoking status: Never Smoker  . Smokeless tobacco: Never Used  Substance and Sexual Activity  . Alcohol use: No  . Drug use: No  . Sexual activity: Not Currently  Lifestyle  . Physical activity:    Days per week: 7 days    Minutes per session: 30 min  . Stress: Not at all  Relationships  . Social connections:    Talks on  phone: Not on file    Gets together: Not on file    Attends religious service: Not on file    Active member of club or organization: Not on file    Attends meetings of clubs or organizations: Not on file    Relationship status: Not on file  Other Topics Concern  . Not on file  Social History Narrative  . Not on file    Outpatient Encounter Medications as of 04/06/2018  Medication Sig  . Ascorbic Acid (VITAMIN C PO) Take 1 tablet by mouth daily.   Marland Kitchen aspirin EC 81 MG tablet Take 81 mg by mouth daily.  Marland Kitchen atorvastatin (LIPITOR) 10 MG tablet Take 10 mg by mouth every evening.  Marland Kitchen azelastine (ASTELIN) 0.1 % nasal spray Use 1-2 sprays per nostril twice daily  . Calcium 200 MG TABS Take 200 mg by mouth daily.  . cholecalciferol (VITAMIN D) 400 units TABS tablet Take 1,200 Units by mouth daily.  . Cyanocobalamin (B-12 PO) Take 1 tablet by mouth as needed (for energy).  Marland Kitchen esomeprazole (NEXIUM) 20 MG capsule Take 20 mg by mouth as needed (for heartburn).  . fexofenadine (ALLEGRA) 180 MG tablet Take 180 mg by mouth daily as needed for allergies.   . fluticasone (FLONASE) 50 MCG/ACT nasal spray Place 2 sprays into both nostrils daily as needed for allergies or rhinitis. (Patient taking differently: Place 1 spray into both nostrils daily as needed for allergies or rhinitis. )  . fluticasone (FLOVENT HFA) 110 MCG/ACT inhaler Inhale 2 puffs into the lungs 2 (two) times daily.  . fluticasone furoate-vilanterol (BREO ELLIPTA) 200-25 MCG/INH AEPB Inhale 1 puff into the lungs daily as needed (shortness of breath).  . LORazepam (ATIVAN) 0.5 MG tablet Take 0.5 mg by mouth 2 (two) times daily.  . nebivolol (BYSTOLIC) 5 MG tablet Take 1 tablet (5 mg total) by mouth daily.  Vladimir Faster Glycol-Propyl Glycol (SYSTANE) 0.4-0.3 % SOLN Apply 1 drop to eye daily.   Marland Kitchen SALINE NASAL SPRAY NA Place 1 puff into the nose at bedtime.  Marland Kitchen telmisartan (MICARDIS) 20 MG tablet Take 20 mg by mouth daily.  Marland Kitchen  triamterene-hydrochlorothiazide (MAXZIDE-25) 37.5-25 MG per tablet Take 1 each (1 tablet total) by mouth every morning.  . VENTOLIN HFA 108 (90 Base) MCG/ACT inhaler TAKE 2 PUFFS BY MOUTH EVERY 6 HOURS AS NEEDED FOR WHEEZE OR SHORTNESS OF BREATH  . doxycycline (VIBRAMYCIN) 100 MG capsule Take 1 capsule by mouth twice a day for 7 days (Patient not taking: Reported on 04/06/2018)  . meclizine (ANTIVERT) 25 MG tablet Take  0.5 tablets (12.5 mg total) by mouth every morning.  . Multiple Vitamins-Minerals (ECHINACEA ACZ PO) Take 1 capsule by mouth as needed (for immune support).  . [DISCONTINUED] Calcium Carbonate-Vitamin D (CALCIUM-VITAMIN D3 PO) Take 1 tablet by mouth every morning. 1200mg  of calcium   No facility-administered encounter medications on file as of 04/06/2018.     Activities of Daily Living In your present state of health, do you have any difficulty performing the following activities: 04/06/2018 12/31/2017  Hearing? N N  Vision? N N  Difficulty concentrating or making decisions? N N  Walking or climbing stairs? N N  Dressing or bathing? N N  Doing errands, shopping? N N  Preparing Food and eating ? N -  Using the Toilet? N -  In the past six months, have you accidently leaked urine? N -  Do you have problems with loss of bowel control? N -  Managing your Medications? N -  Managing your Finances? N -  Housekeeping or managing your Housekeeping? N -  Some recent data might be hidden    Patient Care Team: Glendale Chard, MD as PCP - General (Internal Medicine) Rutherford Guys, MD as Consulting Physician (Ophthalmology)    Assessment:   This is a routine wellness examination for [place].  Exercise Activities and Dietary recommendations Current Exercise Habits: Home exercise routine, Type of exercise: calisthenics(exercise program on tv), Time (Minutes): 30, Frequency (Times/Week): 7, Weekly Exercise (Minutes/Week): 210, Intensity: Moderate, Exercise limited by: None  identified  Goals    . Weight (lb) < 200 lb (90.7 kg)     Patient would like to remain healthy       Fall Risk Fall Risk  04/06/2018  Falls in the past year? No  Risk for fall due to : Medication side effect   Is the patient's home free of loose throw rugs in walkways, pet beds, electrical cords, etc?   yes      Grab bars in the bathroom? yes      Handrails on the stairs?   n/a      Adequate lighting?   yes  Timed Get Up and Go performed: n/a  Depression Screen PHQ 2/9 Scores 04/06/2018  PHQ - 2 Score 0     Cognitive Function     6CIT Screen 04/06/2018  What Year? 0 points  What month? 0 points  What time? 0 points  Count back from 20 0 points  Months in reverse 2 points  Repeat phrase 0 points  Total Score 2     There is no immunization history on file for this patient.  Qualifies for Shingles Vaccine?yes  Screening Tests Health Maintenance  Topic Date Due  . INFLUENZA VACCINE  04/07/2019 (Originally 01/06/2018)  . PNA vac Low Risk Adult (2 of 2 - PPSV23) 02/17/2019  . TETANUS/TDAP  04/01/2028  . DEXA SCAN  Completed    Cancer Screenings: Lung: Low Dose CT Chest recommended if Age 19-80 years, 30 pack-year currently smoking OR have quit w/in 15years. Patient does not qualify. Breast:  Up to date on Mammogram? Yes   Up to date of Bone Density/Dexa? Yes Colorectal: up to date  Additional Screenings: : Hepatitis C Screening: n/a     Plan:   Patient would like to remain healthy. States that she is due for a colonoscopy in 2020  I have personally reviewed and noted the following in the patient's chart:   . Medical and social history . Use of alcohol, tobacco or illicit drugs  .  Current medications and supplements . Functional ability and status . Nutritional status . Physical activity . Advanced directives . List of other physicians . Hospitalizations, surgeries, and ER visits in previous 12 months . Vitals . Screenings to include cognitive,  depression, and falls . Referrals and appointments  In addition, I have reviewed and discussed with patient certain preventive protocols, quality metrics, and best practice recommendations. A written personalized care plan for preventive services as well as general preventive health recommendations were provided to patient.     Kellie Simmering, LPN  [id number]

## 2018-04-06 NOTE — Patient Instructions (Signed)
Paula Pollard , Thank you for taking time to come for your Medicare Wellness Visit. I appreciate your ongoing commitment to your health goals. Please review the following plan we discussed and let me know if I can assist you in the future.   Screening recommendations/referrals: Colonoscopy: due 2020 per patient Mammogram: 01/2018 Bone Density: 01/2018 Recommended yearly ophthalmology/optometry visit for glaucoma screening and checkup Recommended yearly dental visit for hygiene and checkup  Vaccinations: Influenza vaccine: decline Pneumococcal vaccine: 02/2018 Tdap vaccine: 03/2018 Shingles vaccine: decline    Advanced directives: Advance directive discussed with you today. Even though you declined this today please call our office should you change your mind and we can give you the proper paperwork for you to fill out.   Conditions/risks identified: Obesity: exercises regularly. Goal is to remain healthy  Next appointment: 05/27/2018 at 10:30a   Preventive Care 65 Years and Older, Female Preventive care refers to lifestyle choices and visits with your health care provider that can promote health and wellness. What does preventive care include?  A yearly physical exam. This is also called an annual well check.  Dental exams once or twice a year.  Routine eye exams. Ask your health care provider how often you should have your eyes checked.  Personal lifestyle choices, including:  Daily care of your teeth and gums.  Regular physical activity.  Eating a healthy diet.  Avoiding tobacco and drug use.  Limiting alcohol use.  Practicing safe sex.  Taking low-dose aspirin every day.  Taking vitamin and mineral supplements as recommended by your health care provider. What happens during an annual well check? The services and screenings done by your health care provider during your annual well check will depend on your age, overall health, lifestyle risk factors, and family  history of disease. Counseling  Your health care provider may ask you questions about your:  Alcohol use.  Tobacco use.  Drug use.  Emotional well-being.  Home and relationship well-being.  Sexual activity.  Eating habits.  History of falls.  Memory and ability to understand (cognition).  Work and work Statistician.  Reproductive health. Screening  You may have the following tests or measurements:  Height, weight, and BMI.  Blood pressure.  Lipid and cholesterol levels. These may be checked every 5 years, or more frequently if you are over 20 years old.  Skin check.  Lung cancer screening. You may have this screening every year starting at age 51 if you have a 30-pack-year history of smoking and currently smoke or have quit within the past 15 years.  Fecal occult blood test (FOBT) of the stool. You may have this test every year starting at age 54.  Flexible sigmoidoscopy or colonoscopy. You may have a sigmoidoscopy every 5 years or a colonoscopy every 10 years starting at age 32.  Hepatitis C blood test.  Hepatitis B blood test.  Sexually transmitted disease (STD) testing.  Diabetes screening. This is done by checking your blood sugar (glucose) after you have not eaten for a while (fasting). You may have this done every 1-3 years.  Bone density scan. This is done to screen for osteoporosis. You may have this done starting at age 45.  Mammogram. This may be done every 1-2 years. Talk to your health care provider about how often you should have regular mammograms. Talk with your health care provider about your test results, treatment options, and if necessary, the need for more tests. Vaccines  Your health care provider may recommend certain  vaccines, such as:  Influenza vaccine. This is recommended every year.  Tetanus, diphtheria, and acellular pertussis (Tdap, Td) vaccine. You may need a Td booster every 10 years.  Zoster vaccine. You may need this after  age 24.  Pneumococcal 13-valent conjugate (PCV13) vaccine. One dose is recommended after age 66.  Pneumococcal polysaccharide (PPSV23) vaccine. One dose is recommended after age 48. Talk to your health care provider about which screenings and vaccines you need and how often you need them. This information is not intended to replace advice given to you by your health care provider. Make sure you discuss any questions you have with your health care provider. Document Released: 06/21/2015 Document Revised: 02/12/2016 Document Reviewed: 03/26/2015 Elsevier Interactive Patient Education  2017 [place] Prevention in the Home Falls can cause injuries. They can happen to people of all ages. There are many things you can do to make your home safe and to help prevent falls. What can I do on the outside of my home?  Regularly fix the edges of walkways and driveways and fix any cracks.  Remove anything that might make you trip as you walk through a door, such as a raised step or threshold.  Trim any bushes or trees on the path to your home.  Use bright outdoor lighting.  Clear any walking paths of anything that might make someone trip, such as rocks or tools.  Regularly check to see if handrails are loose or broken. Make sure that both sides of any steps have handrails.  Any raised decks and porches should have guardrails on the edges.  Have any leaves, snow, or ice cleared regularly.  Use sand or salt on walking paths during winter.  Clean up any spills in your garage right away. This includes oil or grease spills. What can I do in the bathroom?  Use night lights.  Install grab bars by the toilet and in the tub and shower. Do not use towel bars as grab bars.  Use non-skid mats or decals in the tub or shower.  If you need to sit down in the shower, use a plastic, non-slip stool.  Keep the floor dry. Clean up any water that spills on the floor as soon as it  happens.  Remove soap buildup in the tub or shower regularly.  Attach bath mats securely with double-sided non-slip rug tape.  Do not have throw rugs and other things on the floor that can make you trip. What can I do in the bedroom?  Use night lights.  Make sure that you have a light by your bed that is easy to reach.  Do not use any sheets or blankets that are too big for your bed. They should not hang down onto the floor.  Have a firm chair that has side arms. You can use this for support while you get dressed.  Do not have throw rugs and other things on the floor that can make you trip. What can I do in the kitchen?  Clean up any spills right away.  Avoid walking on wet floors.  Keep items that you use a lot in easy-to-reach places.  If you need to reach something above you, use a strong step stool that has a grab bar.  Keep electrical cords out of the way.  Do not use floor polish or wax that makes floors slippery. If you must use wax, use non-skid floor wax.  Do not have throw rugs and other things  on the floor that can make you trip. What can I do with my stairs?  Do not leave any items on the stairs.  Make sure that there are handrails on both sides of the stairs and use them. Fix handrails that are broken or loose. Make sure that handrails are as long as the stairways.  Check any carpeting to make sure that it is firmly attached to the stairs. Fix any carpet that is loose or worn.  Avoid having throw rugs at the top or bottom of the stairs. If you do have throw rugs, attach them to the floor with carpet tape.  Make sure that you have a light switch at the top of the stairs and the bottom of the stairs. If you do not have them, ask someone to add them for you. What else can I do to help prevent falls?  Wear shoes that:  Do not have high heels.  Have rubber bottoms.  Are comfortable and fit you well.  Are closed at the toe. Do not wear sandals.  If you  use a stepladder:  Make sure that it is fully opened. Do not climb a closed stepladder.  Make sure that both sides of the stepladder are locked into place.  Ask someone to hold it for you, if possible.  Clearly mark and make sure that you can see:  Any grab bars or handrails.  First and last steps.  Where the edge of each step is.  Use tools that help you move around (mobility aids) if they are needed. These include:  Canes.  Walkers.  Scooters.  Crutches.  Turn on the lights when you go into a dark area. Replace any light bulbs as soon as they burn out.  Set up your furniture so you have a clear path. Avoid moving your furniture around.  If any of your floors are uneven, fix them.  If there are any pets around you, be aware of where they are.  Review your medicines with your doctor. Some medicines can make you feel dizzy. This can increase your chance of falling. Ask your doctor what other things that you can do to help prevent falls. This information is not intended to replace advice given to you by your health care provider. Make sure you discuss any questions you have with your health care provider. Document Released: 03/21/2009 Document Revised: 10/31/2015 Document Reviewed: 06/29/2014 Elsevier Interactive Patient Education  2017 [employer].

## 2018-04-11 DIAGNOSIS — I340 Nonrheumatic mitral (valve) insufficiency: Secondary | ICD-10-CM | POA: Diagnosis not present

## 2018-04-11 DIAGNOSIS — E785 Hyperlipidemia, unspecified: Secondary | ICD-10-CM | POA: Diagnosis not present

## 2018-04-11 DIAGNOSIS — M1990 Unspecified osteoarthritis, unspecified site: Secondary | ICD-10-CM | POA: Diagnosis not present

## 2018-04-11 DIAGNOSIS — I2510 Atherosclerotic heart disease of native coronary artery without angina pectoris: Secondary | ICD-10-CM | POA: Diagnosis not present

## 2018-04-11 DIAGNOSIS — I10 Essential (primary) hypertension: Secondary | ICD-10-CM | POA: Diagnosis not present

## 2018-04-11 DIAGNOSIS — E559 Vitamin D deficiency, unspecified: Secondary | ICD-10-CM | POA: Diagnosis not present

## 2018-04-11 DIAGNOSIS — J45909 Unspecified asthma, uncomplicated: Secondary | ICD-10-CM | POA: Diagnosis not present

## 2018-04-18 DIAGNOSIS — E785 Hyperlipidemia, unspecified: Secondary | ICD-10-CM | POA: Diagnosis not present

## 2018-04-18 DIAGNOSIS — E559 Vitamin D deficiency, unspecified: Secondary | ICD-10-CM | POA: Diagnosis not present

## 2018-04-18 DIAGNOSIS — I10 Essential (primary) hypertension: Secondary | ICD-10-CM | POA: Diagnosis not present

## 2018-04-27 ENCOUNTER — Other Ambulatory Visit: Payer: Self-pay

## 2018-05-16 DIAGNOSIS — H2513 Age-related nuclear cataract, bilateral: Secondary | ICD-10-CM | POA: Diagnosis not present

## 2018-05-16 DIAGNOSIS — H25013 Cortical age-related cataract, bilateral: Secondary | ICD-10-CM | POA: Diagnosis not present

## 2018-05-19 ENCOUNTER — Ambulatory Visit (INDEPENDENT_AMBULATORY_CARE_PROVIDER_SITE_OTHER): Payer: Medicare Other | Admitting: Internal Medicine

## 2018-05-19 ENCOUNTER — Encounter: Payer: Self-pay | Admitting: Internal Medicine

## 2018-05-19 VITALS — BP 134/86 | HR 64 | Temp 97.7°F | Ht 63.0 in | Wt 168.0 lb

## 2018-05-19 DIAGNOSIS — Z800 Family history of malignant neoplasm of digestive organs: Secondary | ICD-10-CM | POA: Diagnosis not present

## 2018-05-19 DIAGNOSIS — R7309 Other abnormal glucose: Secondary | ICD-10-CM | POA: Diagnosis not present

## 2018-05-19 DIAGNOSIS — I129 Hypertensive chronic kidney disease with stage 1 through stage 4 chronic kidney disease, or unspecified chronic kidney disease: Secondary | ICD-10-CM | POA: Diagnosis not present

## 2018-05-19 DIAGNOSIS — Z7982 Long term (current) use of aspirin: Secondary | ICD-10-CM | POA: Diagnosis not present

## 2018-05-19 DIAGNOSIS — N182 Chronic kidney disease, stage 2 (mild): Secondary | ICD-10-CM

## 2018-05-19 DIAGNOSIS — R040 Epistaxis: Secondary | ICD-10-CM

## 2018-05-19 NOTE — Patient Instructions (Signed)
DASH Eating Plan DASH stands for "Dietary Approaches to Stop Hypertension." The DASH eating plan is a healthy eating plan that has been shown to reduce high blood pressure (hypertension). It may also reduce your risk for type 2 diabetes, heart disease, and stroke. The DASH eating plan may also help with weight loss. What are tips for following this plan? General guidelines  Avoid eating more than 2,300 mg (milligrams) of salt (sodium) a day. If you have hypertension, you may need to reduce your sodium intake to 1,500 mg a day.  Limit alcohol intake to no more than 1 drink a day for nonpregnant women and 2 drinks a day for men. One drink equals 12 oz of beer, 5 oz of wine, or 1 oz of hard liquor.  Work with your health care provider to maintain a healthy body weight or to lose weight. Ask what an ideal weight is for you.  Get at least 30 minutes of exercise that causes your heart to beat faster (aerobic exercise) most days of the week. Activities may include walking, swimming, or biking.  Work with your health care provider or diet and nutrition specialist (dietitian) to adjust your eating plan to your individual calorie needs. Reading food labels  Check food labels for the amount of sodium per serving. Choose foods with less than 5 percent of the Daily Value of sodium. Generally, foods with less than 300 mg of sodium per serving fit into this eating plan.  To find whole grains, look for the word "whole" as the first word in the ingredient list. Shopping  Buy products labeled as "low-sodium" or "no salt added."  Buy fresh foods. Avoid canned foods and premade or frozen meals. Cooking  Avoid adding salt when cooking. Use salt-free seasonings or herbs instead of table salt or sea salt. Check with your health care provider or pharmacist before using salt substitutes.  Do not fry foods. Cook foods using healthy methods such as baking, boiling, grilling, and broiling instead.  Cook with  heart-healthy oils, such as olive, canola, soybean, or sunflower oil. Meal planning   Eat a balanced diet that includes: ? 5 or more servings of fruits and vegetables each day. At each meal, try to fill half of your plate with fruits and vegetables. ? Up to 6-8 servings of whole grains each day. ? Less than 6 oz of lean meat, poultry, or fish each day. A 3-oz serving of meat is about the same size as a deck of cards. One egg equals 1 oz. ? 2 servings of low-fat dairy each day. ? A serving of nuts, seeds, or beans 5 times each week. ? Heart-healthy fats. Healthy fats called Omega-3 fatty acids are found in foods such as flaxseeds and coldwater fish, like sardines, salmon, and mackerel.  Limit how much you eat of the following: ? Canned or prepackaged foods. ? Food that is high in trans fat, such as fried foods. ? Food that is high in saturated fat, such as fatty meat. ? Sweets, desserts, sugary drinks, and other foods with added sugar. ? Full-fat dairy products.  Do not salt foods before eating.  Try to eat at least 2 vegetarian meals each week.  Eat more home-cooked food and less restaurant, buffet, and fast food.  When eating at a restaurant, ask that your food be prepared with less salt or no salt, if possible. What foods are recommended? The items listed may not be a complete list. Talk with your dietitian about what   dietary choices are best for you. Grains Whole-grain or whole-wheat bread. Whole-grain or whole-wheat pasta. Brown rice. Oatmeal. Quinoa. Bulgur. Whole-grain and low-sodium cereals. Pita bread. Low-fat, low-sodium crackers. Whole-wheat flour tortillas. Vegetables Fresh or frozen vegetables (raw, steamed, roasted, or grilled). Low-sodium or reduced-sodium tomato and vegetable juice. Low-sodium or reduced-sodium tomato sauce and tomato paste. Low-sodium or reduced-sodium canned vegetables. Fruits All fresh, dried, or frozen fruit. Canned fruit in natural juice (without  added sugar). Meat and other protein foods Skinless chicken or turkey. Ground chicken or turkey. Pork with fat trimmed off. Fish and seafood. Egg whites. Dried beans, peas, or lentils. Unsalted nuts, nut butters, and seeds. Unsalted canned beans. Lean cuts of beef with fat trimmed off. Low-sodium, lean deli meat. Dairy Low-fat (1%) or fat-free (skim) milk. Fat-free, low-fat, or reduced-fat cheeses. Nonfat, low-sodium ricotta or cottage cheese. Low-fat or nonfat yogurt. Low-fat, low-sodium cheese. Fats and oils Soft margarine without trans fats. Vegetable oil. Low-fat, reduced-fat, or light mayonnaise and salad dressings (reduced-sodium). Canola, safflower, olive, soybean, and sunflower oils. Avocado. Seasoning and other foods Herbs. Spices. Seasoning mixes without salt. Unsalted popcorn and pretzels. Fat-free sweets. What foods are not recommended? The items listed may not be a complete list. Talk with your dietitian about what dietary choices are best for you. Grains Baked goods made with fat, such as croissants, muffins, or some breads. Dry pasta or rice meal packs. Vegetables Creamed or fried vegetables. Vegetables in a cheese sauce. Regular canned vegetables (not low-sodium or reduced-sodium). Regular canned tomato sauce and paste (not low-sodium or reduced-sodium). Regular tomato and vegetable juice (not low-sodium or reduced-sodium). Pickles. Olives. Fruits Canned fruit in a light or heavy syrup. Fried fruit. Fruit in cream or butter sauce. Meat and other protein foods Fatty cuts of meat. Ribs. Fried meat. Bacon. Sausage. Bologna and other processed lunch meats. Salami. Fatback. Hotdogs. Bratwurst. Salted nuts and seeds. Canned beans with added salt. Canned or smoked fish. Whole eggs or egg yolks. Chicken or turkey with skin. Dairy Whole or 2% milk, cream, and half-and-half. Whole or full-fat cream cheese. Whole-fat or sweetened yogurt. Full-fat cheese. Nondairy creamers. Whipped toppings.  Processed cheese and cheese spreads. Fats and oils Butter. Stick margarine. Lard. Shortening. Ghee. Bacon fat. Tropical oils, such as coconut, palm kernel, or palm oil. Seasoning and other foods Salted popcorn and pretzels. Onion salt, garlic salt, seasoned salt, table salt, and sea salt. Worcestershire sauce. Tartar sauce. Barbecue sauce. Teriyaki sauce. Soy sauce, including reduced-sodium. Steak sauce. Canned and packaged gravies. Fish sauce. Oyster sauce. Cocktail sauce. Horseradish that you find on the shelf. Ketchup. Mustard. Meat flavorings and tenderizers. Bouillon cubes. Hot sauce and Tabasco sauce. Premade or packaged marinades. Premade or packaged taco seasonings. Relishes. Regular salad dressings. Where to find more information:  National Heart, Lung, and Blood Institute: www.nhlbi.nih.gov  American Heart Association: www.heart.org Summary  The DASH eating plan is a healthy eating plan that has been shown to reduce high blood pressure (hypertension). It may also reduce your risk for type 2 diabetes, heart disease, and stroke.  With the DASH eating plan, you should limit salt (sodium) intake to 2,300 mg a day. If you have hypertension, you may need to reduce your sodium intake to 1,500 mg a day.  When on the DASH eating plan, aim to eat more fresh fruits and vegetables, whole grains, lean proteins, low-fat dairy, and heart-healthy fats.  Work with your health care provider or diet and nutrition specialist (dietitian) to adjust your eating plan to your individual   calorie needs. This information is not intended to replace advice given to you by your health care provider. Make sure you discuss any questions you have with your health care provider. Document Released: 05/14/2011 Document Revised: 05/18/2016 Document Reviewed: 05/18/2016 Elsevier Interactive Patient Education  2018 [employer] Inc[employer]  

## 2018-05-20 LAB — BMP8+EGFR
BUN/Creatinine Ratio: 13 (ref 12–28)
BUN: 11 mg/dL (ref 8–27)
CO2: 26 mmol/L (ref 20–29)
Calcium: 9.4 mg/dL (ref 8.7–10.3)
Chloride: 98 mmol/L (ref 96–106)
Creatinine, Ser: 0.83 mg/dL (ref 0.57–1.00)
GFR calc non Af Amer: 64 mL/min/{1.73_m2} (ref >59)
GFR, EST AFRICAN AMERICAN: 74 mL/min/{1.73_m2} (ref >59)
Glucose: 96 mg/dL (ref 65–99)
Potassium: 4 mmol/L (ref 3.5–5.2)
Sodium: 139 mmol/L (ref 134–144)

## 2018-05-20 LAB — HEMOGLOBIN A1C
Est. average glucose Bld gHb Est-mCnc: 117 mg/dL
Hgb A1c MFr Bld: 5.7 % — ABNORMAL HIGH (ref 4.8–5.6)

## 2018-05-21 ENCOUNTER — Encounter: Payer: Self-pay | Admitting: Internal Medicine

## 2018-05-21 NOTE — Progress Notes (Signed)
Your kidney fxn is stable. Be sure to stay well hydrated. You are sl. Prediabetic with an a1c 5.7. Normal is 5.6, you are almost there! Keep up the great work! You take great care of yourself!

## 2018-05-22 NOTE — Progress Notes (Signed)
Subjective:     Patient ID: Paula Pollard , female    DOB: 1930/04/03 , 84 y.o.   MRN: [id number]   Chief Complaint  Patient presents with  . Hypertension    HPI  Hypertension  This is a chronic problem. The current episode started more than 1 year ago. The problem has been gradually improving since onset. The problem is controlled. Pertinent negatives include no blurred vision, chest pain, palpitations or shortness of breath. There are no compliance problems.    She reports compliance with meds.   Past Medical History:  Diagnosis Date  . Anemia    "in past as a young girl"  . Anxiety   . Arthritis   . Asthma   . Bronchitis    hx of  . Cataract    bilaterally, "no surgery at this time"  . Diabetes mellitus without complication ([place])    pre diabetic  . Dysrhythmia    when gets excited  . GERD (gastroesophageal reflux disease)   . Headache(784.0)    hx of migraines  . History of hiatal hernia   . Hyperlipidemia   . Hypertension    sees Dr. Terrence Dupont  . Neuromuscular disorder ([facility])    hx of carpal tunnel "never had surgery for"right hand     Family History  Problem Relation Age of Onset  . Heart attack Mother   . Heart attack Father   . Stroke Father   . Stroke Daughter   . Colon cancer Sister      Current Outpatient Medications:  .  Ascorbic Acid (VITAMIN C PO), Take 1 tablet by mouth daily. , Disp: , Rfl:  .  aspirin EC 81 MG tablet, Take 81 mg by mouth daily., Disp: , Rfl:  .  atorvastatin (LIPITOR) 10 MG tablet, Take 10 mg by mouth every evening., Disp: , Rfl:  .  azelastine (ASTELIN) 0.1 % nasal spray, Use 1-2 sprays per nostril twice daily, Disp: 30 mL, Rfl: 5 .  Calcium 200 MG TABS, Take 200 mg by mouth daily., Disp: , Rfl:  .  cholecalciferol (VITAMIN D) 400 units TABS tablet, Take 1,200 Units by mouth daily., Disp: , Rfl:  .  Cyanocobalamin (B-12 PO), Take 1 tablet by mouth as needed (for energy)., Disp: , Rfl:  .  esomeprazole (NEXIUM) 20 MG  capsule, Take 20 mg by mouth as needed (for heartburn)., Disp: , Rfl:  .  fexofenadine (ALLEGRA) 180 MG tablet, Take 180 mg by mouth daily as needed for allergies. , Disp: , Rfl:  .  fluticasone (FLONASE) 50 MCG/ACT nasal spray, Place 2 sprays into both nostrils daily as needed for allergies or rhinitis. (Patient taking differently: Place 1 spray into both nostrils daily as needed for allergies or rhinitis. ), Disp: 18.2 g, Rfl: 5 .  fluticasone furoate-vilanterol (BREO ELLIPTA) 200-25 MCG/INH AEPB, Inhale 1 puff into the lungs daily., Disp: , Rfl:  .  LORazepam (ATIVAN) 0.5 MG tablet, Take 0.5 mg by mouth 2 (two) times daily., Disp: , Rfl:  .  meclizine (ANTIVERT) 25 MG tablet, Take 0.5 tablets (12.5 mg total) by mouth every morning., Disp: 30 tablet, Rfl: 0 .  Multiple Vitamins-Minerals (ECHINACEA ACZ PO), Take 1 capsule by mouth as needed (for immune support)., Disp: , Rfl:  .  nebivolol (BYSTOLIC) 5 MG tablet, Take 1 tablet (5 mg total) by mouth daily., Disp: 30 tablet, Rfl: 0 .  Polyethyl Glycol-Propyl Glycol (SYSTANE) 0.4-0.3 % SOLN, Apply 1 drop to eye daily. ,  Disp: , Rfl:  .  SALINE NASAL SPRAY NA, Place 1 puff into the nose at bedtime., Disp: , Rfl:  .  telmisartan (MICARDIS) 20 MG tablet, Take 20 mg by mouth daily., Disp: , Rfl: 1 .  triamterene-hydrochlorothiazide (MAXZIDE-25) 37.5-25 MG per tablet, Take 1 each (1 tablet total) by mouth every morning., Disp: 30 tablet, Rfl: 0   Allergies  Allergen Reactions  . Codeine Nausea Only and Other (See Comments)    Reaction:Dizziness and hallucinations "makes me climb walls"     Review of Systems  Constitutional: Negative.   HENT: Positive for nosebleeds (she c/o one episode of a nosebleed. states it happened after she cut heat on in her home. no headache. ).   Eyes: Negative for blurred vision.  Respiratory: Negative.  Negative for shortness of breath.   Cardiovascular: Negative.  Negative for chest pain and palpitations.   Gastrointestinal: Negative.   Neurological: Negative.   Psychiatric/Behavioral: Negative.      Today's Vitals   05/19/18 1054  BP: 134/86  Pulse: 64  Temp: 97.7 F (36.5 C)  TempSrc: Oral  Weight: 168 lb (76.2 kg)  Height: 5' 3" (1.6 m)  PainSc: 0-No pain   Body mass index is 29.76 kg/m.   Objective:  Physical Exam Vitals signs and nursing note reviewed.  Constitutional:      Appearance: Normal appearance.  HENT:     Head: Normocephalic and atraumatic.  Neck:     Musculoskeletal: Normal range of motion.  Cardiovascular:     Rate and Rhythm: Normal rate and regular rhythm.     Heart sounds: Normal heart sounds.  Pulmonary:     Effort: Pulmonary effort is normal.     Breath sounds: Normal breath sounds.  Skin:    General: Skin is warm and dry.  Neurological:     General: No focal deficit present.     Mental Status: She is alert.  Psychiatric:        Mood and Affect: Mood normal.         Assessment And Plan:     1. Hypertensive nephropathy  Fair control.  She will continue with current meds. She is encouraged to avoid adding salt to her foods.   - BMP8+EGFR  2. Chronic renal disease, stage II  Chronic. I will check kidney function today. She is encouraged to stay well hydrated.  3. Other abnormal glucose  HER A1C HAS BEEN ELEVATED IN THE PAST. I WILL CHECK AN A1C, BMET TODAY. SHE WAS ENCOURAGED TO AVOID SUGARY BEVERAGES AND PROCESSED FOODS INCLUDNG BREADS, RICE AND PASTA.  - Hemoglobin A1c  4. Family history of colon cancer in mother  She would like referral to GI, Dr Mann for repeat colonoscopy. She reports her sister had colon cancer as well. She does not wish to have Cologuard testing.   5. Epistaxis  She has had one episode. She will let me know if her sx recur.    N , MD  

## 2018-06-09 DIAGNOSIS — Z1211 Encounter for screening for malignant neoplasm of colon: Secondary | ICD-10-CM | POA: Diagnosis not present

## 2018-06-09 DIAGNOSIS — Z800 Family history of malignant neoplasm of digestive organs: Secondary | ICD-10-CM | POA: Diagnosis not present

## 2018-06-09 DIAGNOSIS — R1033 Periumbilical pain: Secondary | ICD-10-CM | POA: Diagnosis not present

## 2018-06-09 DIAGNOSIS — K219 Gastro-esophageal reflux disease without esophagitis: Secondary | ICD-10-CM | POA: Diagnosis not present

## 2018-06-09 DIAGNOSIS — R634 Abnormal weight loss: Secondary | ICD-10-CM | POA: Diagnosis not present

## 2018-07-09 HISTORY — PX: COLONOSCOPY: SHX174

## 2018-07-11 DIAGNOSIS — I2510 Atherosclerotic heart disease of native coronary artery without angina pectoris: Secondary | ICD-10-CM | POA: Diagnosis not present

## 2018-07-11 DIAGNOSIS — I10 Essential (primary) hypertension: Secondary | ICD-10-CM | POA: Diagnosis not present

## 2018-07-11 DIAGNOSIS — E785 Hyperlipidemia, unspecified: Secondary | ICD-10-CM | POA: Diagnosis not present

## 2018-07-11 DIAGNOSIS — J45909 Unspecified asthma, uncomplicated: Secondary | ICD-10-CM | POA: Diagnosis not present

## 2018-07-11 DIAGNOSIS — I340 Nonrheumatic mitral (valve) insufficiency: Secondary | ICD-10-CM | POA: Diagnosis not present

## 2018-07-11 DIAGNOSIS — E559 Vitamin D deficiency, unspecified: Secondary | ICD-10-CM | POA: Diagnosis not present

## 2018-07-11 DIAGNOSIS — M1990 Unspecified osteoarthritis, unspecified site: Secondary | ICD-10-CM | POA: Diagnosis not present

## 2018-07-11 DIAGNOSIS — Z8673 Personal history of transient ischemic attack (TIA), and cerebral infarction without residual deficits: Secondary | ICD-10-CM | POA: Diagnosis not present

## 2018-08-01 DIAGNOSIS — D124 Benign neoplasm of descending colon: Secondary | ICD-10-CM | POA: Diagnosis not present

## 2018-08-01 DIAGNOSIS — Z1211 Encounter for screening for malignant neoplasm of colon: Secondary | ICD-10-CM | POA: Diagnosis not present

## 2018-08-01 DIAGNOSIS — K635 Polyp of colon: Secondary | ICD-10-CM | POA: Diagnosis not present

## 2018-08-01 DIAGNOSIS — Z86010 Personal history of colonic polyps: Secondary | ICD-10-CM | POA: Diagnosis not present

## 2018-08-01 DIAGNOSIS — Z8371 Family history of colonic polyps: Secondary | ICD-10-CM | POA: Diagnosis not present

## 2018-08-01 DIAGNOSIS — Z800 Family history of malignant neoplasm of digestive organs: Secondary | ICD-10-CM | POA: Diagnosis not present

## 2018-08-01 LAB — HM COLONOSCOPY

## 2018-08-11 ENCOUNTER — Encounter: Payer: Self-pay | Admitting: Internal Medicine

## 2018-08-15 ENCOUNTER — Ambulatory Visit: Payer: Medicare Other | Admitting: Allergy and Immunology

## 2018-08-16 ENCOUNTER — Encounter: Payer: Self-pay | Admitting: Internal Medicine

## 2018-08-26 ENCOUNTER — Encounter: Payer: Self-pay | Admitting: Internal Medicine

## 2018-10-10 DIAGNOSIS — Z8673 Personal history of transient ischemic attack (TIA), and cerebral infarction without residual deficits: Secondary | ICD-10-CM | POA: Diagnosis not present

## 2018-10-10 DIAGNOSIS — E559 Vitamin D deficiency, unspecified: Secondary | ICD-10-CM | POA: Diagnosis not present

## 2018-10-10 DIAGNOSIS — E785 Hyperlipidemia, unspecified: Secondary | ICD-10-CM | POA: Diagnosis not present

## 2018-10-10 DIAGNOSIS — I2510 Atherosclerotic heart disease of native coronary artery without angina pectoris: Secondary | ICD-10-CM | POA: Diagnosis not present

## 2018-10-10 DIAGNOSIS — J309 Allergic rhinitis, unspecified: Secondary | ICD-10-CM | POA: Diagnosis not present

## 2018-10-10 DIAGNOSIS — I10 Essential (primary) hypertension: Secondary | ICD-10-CM | POA: Diagnosis not present

## 2018-10-10 DIAGNOSIS — J45909 Unspecified asthma, uncomplicated: Secondary | ICD-10-CM | POA: Diagnosis not present

## 2018-10-10 DIAGNOSIS — M1990 Unspecified osteoarthritis, unspecified site: Secondary | ICD-10-CM | POA: Diagnosis not present

## 2018-10-10 DIAGNOSIS — I340 Nonrheumatic mitral (valve) insufficiency: Secondary | ICD-10-CM | POA: Diagnosis not present

## 2018-10-18 ENCOUNTER — Other Ambulatory Visit: Payer: Self-pay

## 2018-10-18 ENCOUNTER — Encounter: Payer: Self-pay | Admitting: Internal Medicine

## 2018-10-18 ENCOUNTER — Ambulatory Visit (INDEPENDENT_AMBULATORY_CARE_PROVIDER_SITE_OTHER): Payer: Medicare Other | Admitting: Internal Medicine

## 2018-10-18 VITALS — BP 116/74 | HR 75 | Temp 97.8°F | Ht 63.0 in | Wt 176.0 lb

## 2018-10-18 DIAGNOSIS — R7309 Other abnormal glucose: Secondary | ICD-10-CM | POA: Diagnosis not present

## 2018-10-18 DIAGNOSIS — J4520 Mild intermittent asthma, uncomplicated: Secondary | ICD-10-CM

## 2018-10-18 DIAGNOSIS — E6609 Other obesity due to excess calories: Secondary | ICD-10-CM | POA: Diagnosis not present

## 2018-10-18 DIAGNOSIS — Z6831 Body mass index (BMI) 31.0-31.9, adult: Secondary | ICD-10-CM | POA: Diagnosis not present

## 2018-10-18 DIAGNOSIS — N182 Chronic kidney disease, stage 2 (mild): Secondary | ICD-10-CM

## 2018-10-18 DIAGNOSIS — I129 Hypertensive chronic kidney disease with stage 1 through stage 4 chronic kidney disease, or unspecified chronic kidney disease: Secondary | ICD-10-CM

## 2018-10-18 MED ORDER — MELOXICAM 7.5 MG PO TABS: ORAL_TABLET | ORAL | 2 refills | Status: DC

## 2018-10-18 MED ORDER — FLUTICASONE FUROATE-VILANTEROL 200-25 MCG/INH IN AEPB: 1.0000 | INHALATION_SPRAY | Freq: Every day | RESPIRATORY_TRACT | 5 refills | Status: DC

## 2018-10-18 MED ORDER — TRIAMCINOLONE ACETONIDE 0.1 % EX CREA: 1.0000 "application " | TOPICAL_CREAM | Freq: Two times a day (BID) | CUTANEOUS | 1 refills | Status: DC | PRN

## 2018-10-18 NOTE — Progress Notes (Signed)
Subjective:     Patient ID: Paula Pollard , female    DOB: Feb 12, 1930 , 85 y.o.   MRN: [id number]   Chief Complaint  Patient presents with  . Hypertension    HPI  Hypertension  This is a chronic problem. The current episode started more than 1 year ago. The problem has been gradually improving since onset. The problem is controlled. Pertinent negatives include no blurred vision, chest pain, palpitations or shortness of breath. Risk factors for coronary artery disease include sedentary lifestyle, obesity, stress and post-menopausal state. There are no compliance problems.  Hypertensive end-organ damage includes kidney disease.   She reports compliance with meds.  Past Medical History:  Diagnosis Date  . Anemia    "in past as a young girl"  . Anxiety   . Arthritis   . Asthma   . Bronchitis    hx of  . Cataract    bilaterally, "no surgery at this time"  . Diabetes mellitus without complication ([place])    pre diabetic  . Dysrhythmia    when gets excited  . GERD (gastroesophageal reflux disease)   . Headache(784.0)    hx of migraines  . History of hiatal hernia   . Hyperlipidemia   . Hypertension    sees Dr. Terrence Dupont  . Neuromuscular disorder (HCC)    hx of carpal tunnel "never had surgery for"right hand     Family History  Problem Relation Age of Onset  . Heart attack Mother   . Heart attack Father   . Stroke Father   . Stroke Daughter   . Colon cancer Sister      Current Outpatient Medications:  .  Ascorbic Acid (VITAMIN C PO), Take 1 tablet by mouth daily. , Disp: , Rfl:  .  aspirin EC 81 MG tablet, Take 81 mg by mouth daily., Disp: , Rfl:  .  atorvastatin (LIPITOR) 10 MG tablet, Take 10 mg by mouth every evening., Disp: , Rfl:  .  Calcium 200 MG TABS, Take 200 mg by mouth daily., Disp: , Rfl:  .  cholecalciferol (VITAMIN D) 400 units TABS tablet, Take 1,200 Units by mouth daily., Disp: , Rfl:  .  Cyanocobalamin (B-12 PO), Take 1 tablet by mouth as needed (for  energy)., Disp: , Rfl:  .  esomeprazole (NEXIUM) 20 MG capsule, Take 20 mg by mouth as needed (for heartburn)., Disp: , Rfl:  .  fexofenadine (ALLEGRA) 180 MG tablet, Take 180 mg by mouth daily as needed for allergies. , Disp: , Rfl:  .  fluticasone (FLONASE) 50 MCG/ACT nasal spray, Place 2 sprays into both nostrils daily as needed for allergies or rhinitis. (Patient taking differently: Place 1 spray into both nostrils daily as needed for allergies or rhinitis. ), Disp: 18.2 g, Rfl: 5 .  fluticasone furoate-vilanterol (BREO ELLIPTA) 200-25 MCG/INH AEPB, Inhale 1 puff into the lungs daily., Disp: 1 each, Rfl: 5 .  meclizine (ANTIVERT) 25 MG tablet, Take 0.5 tablets (12.5 mg total) by mouth every morning., Disp: 30 tablet, Rfl: 0 .  Multiple Vitamins-Minerals (ECHINACEA ACZ PO), Take 1 capsule by mouth as needed (for immune support)., Disp: , Rfl:  .  nebivolol (BYSTOLIC) 5 MG tablet, Take 1 tablet (5 mg total) by mouth daily., Disp: 30 tablet, Rfl: 0 .  Polyethyl Glycol-Propyl Glycol (SYSTANE) 0.4-0.3 % SOLN, Apply 1 drop to eye daily. , Disp: , Rfl:  .  SALINE NASAL SPRAY NA, Place 1 puff into the nose at bedtime., Disp: ,  Rfl:  .  telmisartan (MICARDIS) 20 MG tablet, Take 20 mg by mouth daily., Disp: , Rfl: 1 .  meloxicam (MOBIC) 7.5 MG tablet, One tab po qd prn, Disp: 30 tablet, Rfl: 2 .  triamcinolone cream (KENALOG) 0.1 %, Apply 1 application topically 2 (two) times daily as needed., Disp: 30 g, Rfl: 1   Allergies  Allergen Reactions  . Codeine Nausea Only and Other (See Comments)    Reaction:Dizziness and hallucinations "makes me climb walls"     Review of Systems  Constitutional: Negative.   Eyes: Negative for blurred vision.  Respiratory: Negative.  Negative for shortness of breath.   Cardiovascular: Negative.  Negative for chest pain and palpitations.  Gastrointestinal: Negative.   Neurological: Negative.   Psychiatric/Behavioral: Negative.      Today's Vitals   10/18/18 1008   BP: 116/74  Pulse: 75  Temp: 97.8 F (36.6 C)  TempSrc: Oral  Weight: 176 lb (79.8 kg)  Height: _0  (1.6 m)  PainSc: 0-No pain   Body mass index is 31.18 kg/m.   Objective:  Physical Exam Vitals signs and nursing note reviewed.  Constitutional:      Appearance: Normal appearance.  HENT:     Head: Normocephalic and atraumatic.  Cardiovascular:     Rate and Rhythm: Normal rate and regular rhythm.     Heart sounds: Normal heart sounds.  Pulmonary:     Effort: Pulmonary effort is normal.     Breath sounds: Normal breath sounds.  Skin:    General: Skin is warm.  Neurological:     General: No focal deficit present.     Mental Status: She is alert.  Psychiatric:        Mood and Affect: Mood normal.        Behavior: Behavior normal.         Assessment And Plan:     1. Hypertensive nephropathy  Well controlled.  She will continue with current meds. She is encouraged to avoid adding salt to her foods.   - CMP14+EGFR - Lipid panel - Referral to Chronic Care Management Services  2. Chronic renal disease, stage II  Chronic. I will check a GFR, Cr today.   - Referral to Chronic Care Management Services  3. Other abnormal glucose  HER A1C HAS BEEN ELEVATED IN THE PAST. I WILL CHECK AN A1C, BMET TODAY. SHE WAS ENCOURAGED TO AVOID SUGARY BEVERAGES AND PROCESSED FOODS INCLUDNG BREADS, RICE AND PASTA.  - Hemoglobin A1c  4. Mild intermittent asthma without complication  Chronic. She will continue with Breo. I will also refer her for CCM services.   - Referral to Chronic Care Management Services  5. Class 1 obesity due to excess calories with serious comorbidity and body mass index (BMI) of 31.0 to 31.9 in adult  Importance of achieving optimal weight to decrease risk of cardiovascular disease and cancers was discussed with the patient in full detail. She is encouraged to start slowly - start with 10 minutes twice daily at least three to four days per week and to  gradually build to 30 minutes five days weekly. She was given tips to incorporate more activity into her daily routine - take stairs when possible, park farther away from grocery stores, etc.    [name], MD    THE PATIENT IS ENCOURAGED TO PRACTICE SOCIAL DISTANCING DUE TO THE COVID-19 PANDEMIC.

## 2018-10-18 NOTE — Patient Instructions (Signed)
DASH Eating Plan  DASH stands for "Dietary Approaches to Stop Hypertension." The DASH eating plan is a healthy eating plan that has been shown to reduce high blood pressure (hypertension). It may also reduce your risk for type 2 diabetes, heart disease, and stroke. The DASH eating plan may also help with weight loss.  What are tips for following this plan?    General guidelines   Avoid eating more than 2,300 mg (milligrams) of salt (sodium) a day. If you have hypertension, you may need to reduce your sodium intake to 1,500 mg a day.   Limit alcohol intake to no more than 1 drink a day for nonpregnant women and 2 drinks a day for men. One drink equals 12 oz of beer, 5 oz of wine, or 1 oz of hard liquor.   Work with your health care provider to maintain a healthy body weight or to lose weight. Ask what an ideal weight is for you.   Get at least 30 minutes of exercise that causes your heart to beat faster (aerobic exercise) most days of the week. Activities may include walking, swimming, or biking.   Work with your health care provider or diet and nutrition specialist (dietitian) to adjust your eating plan to your individual calorie needs.  Reading food labels     Check food labels for the amount of sodium per serving. Choose foods with less than 5 percent of the Daily Value of sodium. Generally, foods with less than 300 mg of sodium per serving fit into this eating plan.   To find whole grains, look for the word "whole" as the first word in the ingredient list.  Shopping   Buy products labeled as "low-sodium" or "no salt added."   Buy fresh foods. Avoid canned foods and premade or frozen meals.  Cooking   Avoid adding salt when cooking. Use salt-free seasonings or herbs instead of table salt or sea salt. Check with your health care provider or pharmacist before using salt substitutes.   Do not fry foods. Cook foods using healthy methods such as baking, boiling, grilling, and broiling instead.   Cook with  heart-healthy oils, such as olive, canola, soybean, or sunflower oil.  Meal planning   Eat a balanced diet that includes:  ? 5 or more servings of fruits and vegetables each day. At each meal, try to fill half of your plate with fruits and vegetables.  ? Up to 6-8 servings of whole grains each day.  ? Less than 6 oz of lean meat, poultry, or fish each day. A 3-oz serving of meat is about the same size as a deck of cards. One egg equals 1 oz.  ? 2 servings of low-fat dairy each day.  ? A serving of nuts, seeds, or beans 5 times each week.  ? Heart-healthy fats. Healthy fats called Omega-3 fatty acids are found in foods such as flaxseeds and coldwater fish, like sardines, salmon, and mackerel.   Limit how much you eat of the following:  ? Canned or prepackaged foods.  ? Food that is high in trans fat, such as fried foods.  ? Food that is high in saturated fat, such as fatty meat.  ? Sweets, desserts, sugary drinks, and other foods with added sugar.  ? Full-fat dairy products.   Do not salt foods before eating.   Try to eat at least 2 vegetarian meals each week.   Eat more home-cooked food and less restaurant, buffet, and fast food.     When eating at a restaurant, ask that your food be prepared with less salt or no salt, if possible.  What foods are recommended?  The items listed may not be a complete list. Talk with your dietitian about what dietary choices are best for you.  Grains  Whole-grain or whole-wheat bread. Whole-grain or whole-wheat pasta. Brown rice. Oatmeal. Quinoa. Bulgur. Whole-grain and low-sodium cereals. Pita bread. Low-fat, low-sodium crackers. Whole-wheat flour tortillas.  Vegetables  Fresh or frozen vegetables (raw, steamed, roasted, or grilled). Low-sodium or reduced-sodium tomato and vegetable juice. Low-sodium or reduced-sodium tomato sauce and tomato paste. Low-sodium or reduced-sodium canned vegetables.  Fruits  All fresh, dried, or frozen fruit. Canned fruit in natural juice (without  added sugar).  Meat and other protein foods  Skinless chicken or turkey. Ground chicken or turkey. Pork with fat trimmed off. Fish and seafood. Egg whites. Dried beans, peas, or lentils. Unsalted nuts, nut butters, and seeds. Unsalted canned beans. Lean cuts of beef with fat trimmed off. Low-sodium, lean deli meat.  Dairy  Low-fat (1%) or fat-free (skim) milk. Fat-free, low-fat, or reduced-fat cheeses. Nonfat, low-sodium ricotta or cottage cheese. Low-fat or nonfat yogurt. Low-fat, low-sodium cheese.  Fats and oils  Soft margarine without trans fats. Vegetable oil. Low-fat, reduced-fat, or light mayonnaise and salad dressings (reduced-sodium). Canola, safflower, olive, soybean, and sunflower oils. Avocado.  Seasoning and other foods  Herbs. Spices. Seasoning mixes without salt. Unsalted popcorn and pretzels. Fat-free sweets.  What foods are not recommended?  The items listed may not be a complete list. Talk with your dietitian about what dietary choices are best for you.  Grains  Baked goods made with fat, such as croissants, muffins, or some breads. Dry pasta or rice meal packs.  Vegetables  Creamed or fried vegetables. Vegetables in a cheese sauce. Regular canned vegetables (not low-sodium or reduced-sodium). Regular canned tomato sauce and paste (not low-sodium or reduced-sodium). Regular tomato and vegetable juice (not low-sodium or reduced-sodium). Pickles. Olives.  Fruits  Canned fruit in a light or heavy syrup. Fried fruit. Fruit in cream or butter sauce.  Meat and other protein foods  Fatty cuts of meat. Ribs. Fried meat. Bacon. Sausage. Bologna and other processed lunch meats. Salami. Fatback. Hotdogs. Bratwurst. Salted nuts and seeds. Canned beans with added salt. Canned or smoked fish. Whole eggs or egg yolks. Chicken or turkey with skin.  Dairy  Whole or 2% milk, cream, and half-and-half. Whole or full-fat cream cheese. Whole-fat or sweetened yogurt. Full-fat cheese. Nondairy creamers. Whipped toppings.  Processed cheese and cheese spreads.  Fats and oils  Butter. Stick margarine. Lard. Shortening. Ghee. Bacon fat. Tropical oils, such as coconut, palm kernel, or palm oil.  Seasoning and other foods  Salted popcorn and pretzels. Onion salt, garlic salt, seasoned salt, table salt, and sea salt. Worcestershire sauce. Tartar sauce. Barbecue sauce. Teriyaki sauce. Soy sauce, including reduced-sodium. Steak sauce. Canned and packaged gravies. Fish sauce. Oyster sauce. Cocktail sauce. Horseradish that you find on the shelf. Ketchup. Mustard. Meat flavorings and tenderizers. Bouillon cubes. Hot sauce and Tabasco sauce. Premade or packaged marinades. Premade or packaged taco seasonings. Relishes. Regular salad dressings.  Where to find more information:   National Heart, Lung, and Blood Institute: www.nhlbi.nih.gov   American Heart Association: www.heart.org  Summary   The DASH eating plan is a healthy eating plan that has been shown to reduce high blood pressure (hypertension). It may also reduce your risk for type 2 diabetes, heart disease, and stroke.   With the   DASH eating plan, you should limit salt (sodium) intake to 2,300 mg a day. If you have hypertension, you may need to reduce your sodium intake to 1,500 mg a day.   When on the DASH eating plan, aim to eat more fresh fruits and vegetables, whole grains, lean proteins, low-fat dairy, and heart-healthy fats.   Work with your health care provider or diet and nutrition specialist (dietitian) to adjust your eating plan to your individual calorie needs.  This information is not intended to replace advice given to you by your health care provider. Make sure you discuss any questions you have with your health care provider.  Document Released: 05/14/2011 Document Revised: 05/18/2016 Document Reviewed: 05/18/2016  Elsevier Interactive Patient Education  2019 [employer]

## 2018-10-19 LAB — CMP14+EGFR
ALT: 13 IU/L (ref 0–32)
AST: 22 IU/L (ref 0–40)
Albumin/Globulin Ratio: 1.5 (ref 1.2–2.2)
Albumin: 4.2 g/dL (ref 3.6–4.6)
Alkaline Phosphatase: 82 IU/L (ref 39–117)
BUN/Creatinine Ratio: 17 (ref 12–28)
BUN: 14 mg/dL (ref 8–27)
Bilirubin Total: 0.4 mg/dL (ref 0.0–1.2)
CO2: 21 mmol/L (ref 20–29)
Calcium: 9.4 mg/dL (ref 8.7–10.3)
Chloride: 99 mmol/L (ref 96–106)
Creatinine, Ser: 0.82 mg/dL (ref 0.57–1.00)
GFR calc Af Amer: 75 mL/min/{1.73_m2} (ref >59)
GFR calc non Af Amer: 65 mL/min/{1.73_m2} (ref >59)
Globulin, Total: 2.8 g/dL (ref 1.5–4.5)
Glucose: 102 mg/dL — ABNORMAL HIGH (ref 65–99)
Potassium: 4 mmol/L (ref 3.5–5.2)
Sodium: 139 mmol/L (ref 134–144)
Total Protein: 7 g/dL (ref 6.0–8.5)

## 2018-10-19 LAB — HEMOGLOBIN A1C
Est. average glucose Bld gHb Est-mCnc: 120 mg/dL
Hgb A1c MFr Bld: 5.8 % — ABNORMAL HIGH (ref 4.8–5.6)

## 2018-10-19 LAB — LIPID PANEL
Chol/HDL Ratio: 2.7 ratio (ref 0.0–4.4)
Cholesterol, Total: 135 mg/dL (ref 100–199)
HDL: 50 mg/dL (ref >39)
LDL Calculated: 54 mg/dL (ref 0–99)
Triglycerides: 154 mg/dL — ABNORMAL HIGH (ref 0–149)
VLDL Cholesterol Cal: 31 mg/dL (ref 5–40)

## 2018-10-20 ENCOUNTER — Telehealth: Payer: Self-pay

## 2018-10-20 ENCOUNTER — Ambulatory Visit: Payer: Self-pay

## 2018-10-20 DIAGNOSIS — J4531 Mild persistent asthma with (acute) exacerbation: Secondary | ICD-10-CM

## 2018-10-20 DIAGNOSIS — I129 Hypertensive chronic kidney disease with stage 1 through stage 4 chronic kidney disease, or unspecified chronic kidney disease: Secondary | ICD-10-CM

## 2018-10-20 DIAGNOSIS — N182 Chronic kidney disease, stage 2 (mild): Secondary | ICD-10-CM

## 2018-10-20 DIAGNOSIS — I10 Essential (primary) hypertension: Secondary | ICD-10-CM

## 2018-10-20 NOTE — Chronic Care Management (AMB) (Signed)
  Chronic Care Management   Outreach Note  10/20/2018 Name: Paula Pollard MRN: [id number] DOB: [birth year]  Referred by: Glendale Chard, MD Reason for referral : Care Coordination   An unsuccessful telephone outreach was attempted today. The patient was referred to the case management team by for assistance with chronic care management and care coordination.   Follow Up Plan: A HIPPA compliant phone message was left for the patient providing contact information and requesting a return call.  The CM team will reach out to the patient again over the next 7 days.   Daneen Schick, BSW, CDP [facility] Care Management Social Worker [phone]  Total time spent performing care coordination and/or care management activities with the patient by phone or face to face = 3 minutes.

## 2018-10-20 NOTE — Telephone Encounter (Signed)
-----   Message from Glendale Chard, MD sent at 10/19/2018  8:16 AM EDT ----- Your liver and kidney function are nl. Your hba1c is 5.8, in predm range. Be sure to avoid sugary beverages and exercise as we discussed. Cholesterol looks pretty good. Triglycerides elevated b/c you had eaten. We will continue to follow this. Stay safe! We will also fax copy of labs to Dr. Terrence Dupont. Take care.

## 2018-10-20 NOTE — Telephone Encounter (Signed)
Left the patient a message to call back for lab results. 

## 2018-10-21 ENCOUNTER — Telehealth: Payer: Self-pay

## 2018-10-21 ENCOUNTER — Other Ambulatory Visit: Payer: Self-pay | Admitting: Internal Medicine

## 2018-10-21 NOTE — Telephone Encounter (Signed)
-----   Message from Glendale Chard, MD sent at 10/21/2018  8:09 AM EDT ----- I did not stop her medication. As long as she continues to get from Dr. Terrence Dupont, that is fine with me.   I do not recall stopping the water pill.   RS ----- Message ----- From: Michelle Nasuti, [place]: 10/20/2018   4:34 PM EDT To: Glendale Chard, MD  The pt wants to know why she has to stop her Lorazepam because she need it for stress and is she supposed to stop the triamterene/hctz 37.5 / 25 mg.

## 2018-10-21 NOTE — Telephone Encounter (Signed)
Patient notified

## 2018-10-25 ENCOUNTER — Ambulatory Visit: Payer: Self-pay

## 2018-10-25 DIAGNOSIS — I10 Essential (primary) hypertension: Secondary | ICD-10-CM

## 2018-10-25 DIAGNOSIS — N182 Chronic kidney disease, stage 2 (mild): Secondary | ICD-10-CM

## 2018-10-25 DIAGNOSIS — I129 Hypertensive chronic kidney disease with stage 1 through stage 4 chronic kidney disease, or unspecified chronic kidney disease: Secondary | ICD-10-CM

## 2018-10-25 NOTE — Chronic Care Management (AMB) (Signed)
  Care Management Note   Paula Pollard is a 85 y.o. year old female who is a primary care patient of Glendale Chard, MD . The CM team was consulted for assistance with chronic care management.  Review of patient status, including review of consultants reports, rand collaboration with appropriate care team members and the patient's provider was performed as part of comprehensive patient evaluation and provision of chronic care management services. Telephone outreach to patient today to introduce CCM services.   I reached out to Paula Pollard by phone today.   Paula Pollard was given information about Chronic Care Management services today including:  1. CCM service includes personalized support from designated clinical staff supervised by her physician, including individualized plan of care and coordination with other care providers 2. 24/7 contact phone numbers for assistance for urgent and routine care needs. 3. Service will only be billed when office clinical staff spend 20 minutes or more in a month to coordinate care. 4. Only one practitioner may furnish and bill the service in a calendar month. 5. The patient may stop CCM services at any time (effective at the end of the month) by phone call to the office staff. 6. The patient will be responsible for cost sharing (co-pay) of up to 20% of the service fee (after annual deductible is met).   Patient did not agree to services and does not wish to consider at this time. The patient reports "I don't believe I qualify for that program because I only take four medications and I would like to just stick with my one doctor".   Follow Up Plan: No further follow up at this time. The patient acknowledges she will outreach CCM team if she changes her mind.   Daneen Schick, BSW, CDP [facility] Care Management Social Worker [phone]  Total time spent performing care coordination and/or care management activities with the patient by phone or  face to face = 6 minutes.

## 2018-10-28 ENCOUNTER — Inpatient Hospital Stay (HOSPITAL_COMMUNITY)
Admission: EM | Admit: 2018-10-28 | Discharge: 2018-10-30 | DRG: 149 | Disposition: A | Discharge status: Home / Self Care | Payer: Medicare Other | Attending: Internal Medicine | Admitting: Internal Medicine

## 2018-10-28 ENCOUNTER — Emergency Department (HOSPITAL_COMMUNITY): Payer: Medicare Other

## 2018-10-28 ENCOUNTER — Other Ambulatory Visit: Payer: Self-pay

## 2018-10-28 ENCOUNTER — Encounter (HOSPITAL_COMMUNITY): Payer: Self-pay | Admitting: Emergency Medicine

## 2018-10-28 DIAGNOSIS — E1136 Type 2 diabetes mellitus with diabetic cataract: Secondary | ICD-10-CM | POA: Diagnosis not present

## 2018-10-28 DIAGNOSIS — Z7951 Long term (current) use of inhaled steroids: Secondary | ICD-10-CM

## 2018-10-28 DIAGNOSIS — R112 Nausea with vomiting, unspecified: Secondary | ICD-10-CM | POA: Diagnosis not present

## 2018-10-28 DIAGNOSIS — Z7982 Long term (current) use of aspirin: Secondary | ICD-10-CM

## 2018-10-28 DIAGNOSIS — Z79899 Other long term (current) drug therapy: Secondary | ICD-10-CM

## 2018-10-28 DIAGNOSIS — Z823 Family history of stroke: Secondary | ICD-10-CM

## 2018-10-28 DIAGNOSIS — I447 Left bundle-branch block, unspecified: Secondary | ICD-10-CM | POA: Diagnosis not present

## 2018-10-28 DIAGNOSIS — E785 Hyperlipidemia, unspecified: Secondary | ICD-10-CM | POA: Diagnosis present

## 2018-10-28 DIAGNOSIS — R079 Chest pain, unspecified: Secondary | ICD-10-CM | POA: Diagnosis not present

## 2018-10-28 DIAGNOSIS — Z96641 Presence of right artificial hip joint: Secondary | ICD-10-CM | POA: Diagnosis present

## 2018-10-28 DIAGNOSIS — Z791 Long term (current) use of non-steroidal anti-inflammatories (NSAID): Secondary | ICD-10-CM

## 2018-10-28 DIAGNOSIS — F419 Anxiety disorder, unspecified: Secondary | ICD-10-CM | POA: Diagnosis present

## 2018-10-28 DIAGNOSIS — R42 Dizziness and giddiness: Secondary | ICD-10-CM | POA: Diagnosis not present

## 2018-10-28 DIAGNOSIS — I10 Essential (primary) hypertension: Secondary | ICD-10-CM | POA: Diagnosis not present

## 2018-10-28 DIAGNOSIS — Z885 Allergy status to narcotic agent status: Secondary | ICD-10-CM

## 2018-10-28 DIAGNOSIS — Z20828 Contact with and (suspected) exposure to other viral communicable diseases: Secondary | ICD-10-CM | POA: Diagnosis not present

## 2018-10-28 DIAGNOSIS — Z8249 Family history of ischemic heart disease and other diseases of the circulatory system: Secondary | ICD-10-CM

## 2018-10-28 DIAGNOSIS — R0902 Hypoxemia: Secondary | ICD-10-CM | POA: Diagnosis not present

## 2018-10-28 DIAGNOSIS — J45909 Unspecified asthma, uncomplicated: Secondary | ICD-10-CM | POA: Diagnosis present

## 2018-10-28 DIAGNOSIS — Z800 Family history of malignant neoplasm of digestive organs: Secondary | ICD-10-CM

## 2018-10-28 DIAGNOSIS — E876 Hypokalemia: Secondary | ICD-10-CM | POA: Diagnosis not present

## 2018-10-28 DIAGNOSIS — K219 Gastro-esophageal reflux disease without esophagitis: Secondary | ICD-10-CM | POA: Diagnosis present

## 2018-10-28 LAB — COMPREHENSIVE METABOLIC PANEL
ALT: 12 U/L (ref 0–44)
AST: 16 U/L (ref 15–41)
Albumin: 2.5 g/dL — ABNORMAL LOW (ref 3.5–5.0)
Alkaline Phosphatase: 48 U/L (ref 38–126)
Anion gap: 6 (ref 5–15)
BUN: 12 mg/dL (ref 8–23)
CO2: 20 mmol/L — ABNORMAL LOW (ref 22–32)
Calcium: 6.6 mg/dL — ABNORMAL LOW (ref 8.9–10.3)
Chloride: 114 mmol/L — ABNORMAL HIGH (ref 98–111)
Creatinine, Ser: 0.53 mg/dL (ref 0.44–1.00)
GFR calc Af Amer: >60 mL/min (ref >60)
GFR calc non Af Amer: >60 mL/min (ref >60)
Glucose, Bld: 94 mg/dL (ref 70–99)
Potassium: 2.5 mmol/L — CL (ref 3.5–5.1)
Sodium: 140 mmol/L (ref 135–145)
Total Bilirubin: 0.3 mg/dL (ref 0.3–1.2)
Total Protein: 4.9 g/dL — ABNORMAL LOW (ref 6.5–8.1)

## 2018-10-28 LAB — CBC WITH DIFFERENTIAL/PLATELET
Abs Immature Granulocytes: 0.01 10*3/uL (ref 0.00–0.07)
Basophils Absolute: 0 10*3/uL (ref 0.0–0.1)
Basophils Relative: 1 %
Eosinophils Absolute: 0.1 10*3/uL (ref 0.0–0.5)
Eosinophils Relative: 2 %
HCT: 33.5 % — ABNORMAL LOW (ref 36.0–46.0)
Hemoglobin: 10.5 g/dL — ABNORMAL LOW (ref 12.0–15.0)
Immature Granulocytes: 0 %
Lymphocytes Relative: 17 %
Lymphs Abs: 0.7 10*3/uL (ref 0.7–4.0)
MCH: 29 pg (ref 26.0–34.0)
MCHC: 31.3 g/dL (ref 30.0–36.0)
MCV: 92.5 fL (ref 80.0–100.0)
Monocytes Absolute: 0.4 10*3/uL (ref 0.1–1.0)
Monocytes Relative: 10 %
Neutro Abs: 3 10*3/uL (ref 1.7–7.7)
Neutrophils Relative %: 70 %
Platelets: 166 10*3/uL (ref 150–400)
RBC: 3.62 MIL/uL — ABNORMAL LOW (ref 3.87–5.11)
RDW: 12.6 % (ref 11.5–15.5)
WBC: 4.3 10*3/uL (ref 4.0–10.5)
nRBC: 0 % (ref 0.0–0.2)

## 2018-10-28 LAB — URINALYSIS, COMPLETE (UACMP) WITH MICROSCOPIC
Bilirubin Urine: NEGATIVE
Glucose, UA: NEGATIVE mg/dL
Hgb urine dipstick: NEGATIVE
Ketones, ur: NEGATIVE mg/dL
Leukocytes,Ua: NEGATIVE
Nitrite: NEGATIVE
Protein, ur: NEGATIVE mg/dL
Specific Gravity, Urine: 1.017 (ref 1.005–1.030)
pH: 7 (ref 5.0–8.0)

## 2018-10-28 LAB — PROTIME-INR
INR: 1.1 (ref 0.8–1.2)
Prothrombin Time: 14.1 seconds (ref 11.4–15.2)

## 2018-10-28 LAB — MAGNESIUM: Magnesium: 1.5 mg/dL — ABNORMAL LOW (ref 1.7–2.4)

## 2018-10-28 LAB — SARS CORONAVIRUS 2 BY RT PCR (HOSPITAL ORDER, PERFORMED IN ~~LOC~~ HOSPITAL LAB): SARS Coronavirus 2: NEGATIVE

## 2018-10-28 LAB — CBG MONITORING, ED: Glucose-Capillary: 91 mg/dL (ref 70–99)

## 2018-10-28 MED ORDER — SODIUM CHLORIDE 0.9 % IV SOLN: INTRAVENOUS | Status: DC

## 2018-10-28 MED ORDER — ACETAMINOPHEN 325 MG PO TABS: 650.0000 mg | ORAL_TABLET | Freq: Four times a day (QID) | ORAL | Status: DC | PRN

## 2018-10-28 MED ORDER — SODIUM CHLORIDE 0.9 % IV BOLUS
1000.0000 mL | Freq: Once | INTRAVENOUS | Status: AC
  Administered 2018-10-28: 1000 mL via INTRAVENOUS

## 2018-10-28 MED ORDER — ASPIRIN EC 81 MG PO TBEC
81.0000 mg | DELAYED_RELEASE_TABLET | Freq: Every day | ORAL | Status: DC
  Administered 2018-10-29 – 2018-10-30 (×2): 81 mg via ORAL
  Filled 2018-10-28 (×2): qty 1

## 2018-10-28 MED ORDER — PANTOPRAZOLE SODIUM 40 MG PO TBEC
40.0000 mg | DELAYED_RELEASE_TABLET | Freq: Every day | ORAL | Status: DC
  Administered 2018-10-29 – 2018-10-30 (×2): 40 mg via ORAL
  Filled 2018-10-28 (×2): qty 1

## 2018-10-28 MED ORDER — NEBIVOLOL HCL 5 MG PO TABS
5.0000 mg | ORAL_TABLET | Freq: Every day | ORAL | Status: DC
  Administered 2018-10-29 – 2018-10-30 (×2): 5 mg via ORAL
  Filled 2018-10-28 (×2): qty 1

## 2018-10-28 MED ORDER — DOCUSATE SODIUM 100 MG PO CAPS
100.0000 mg | ORAL_CAPSULE | Freq: Two times a day (BID) | ORAL | Status: DC
  Administered 2018-10-28 – 2018-10-29 (×2): 100 mg via ORAL
  Filled 2018-10-28 (×3): qty 1

## 2018-10-28 MED ORDER — ATORVASTATIN CALCIUM 10 MG PO TABS
20.0000 mg | ORAL_TABLET | Freq: Every evening | ORAL | Status: DC
  Administered 2018-10-28: 20 mg via ORAL
  Administered 2018-10-29: 10 mg via ORAL
  Filled 2018-10-28 (×2): qty 2

## 2018-10-28 MED ORDER — METOCLOPRAMIDE HCL 5 MG/ML IJ SOLN: 10.0000 mg | Freq: Once | INTRAMUSCULAR | Status: DC

## 2018-10-28 MED ORDER — POTASSIUM CHLORIDE CRYS ER 20 MEQ PO TBCR
40.0000 meq | EXTENDED_RELEASE_TABLET | Freq: Once | ORAL | Status: AC
  Administered 2018-10-28: 40 meq via ORAL
  Filled 2018-10-28: qty 2

## 2018-10-28 MED ORDER — MECLIZINE HCL 25 MG PO TABS
25.0000 mg | ORAL_TABLET | Freq: Three times a day (TID) | ORAL | Status: DC | PRN
  Administered 2018-10-29: 25 mg via ORAL
  Filled 2018-10-28 (×2): qty 1

## 2018-10-28 MED ORDER — LORAZEPAM 1 MG PO TABS
0.5000 mg | ORAL_TABLET | Freq: Three times a day (TID) | ORAL | Status: DC
  Administered 2018-10-28 – 2018-10-29 (×3): 0.5 mg via ORAL
  Filled 2018-10-28 (×3): qty 1

## 2018-10-28 MED ORDER — POTASSIUM CHLORIDE 10 MEQ/100ML IV SOLN
10.0000 meq | INTRAVENOUS | Status: AC
  Administered 2018-10-28 (×6): 10 meq via INTRAVENOUS
  Filled 2018-10-28 (×6): qty 100

## 2018-10-28 MED ORDER — FLUTICASONE FUROATE-VILANTEROL 200-25 MCG/INH IN AEPB
1.0000 | INHALATION_SPRAY | Freq: Every day | RESPIRATORY_TRACT | Status: DC
  Administered 2018-10-29 – 2018-10-30 (×2): 1 via RESPIRATORY_TRACT
  Filled 2018-10-28: qty 28

## 2018-10-28 MED ORDER — ACETAMINOPHEN 650 MG RE SUPP: 650.0000 mg | Freq: Four times a day (QID) | RECTAL | Status: DC | PRN

## 2018-10-28 MED ORDER — ENOXAPARIN SODIUM 40 MG/0.4ML ~~LOC~~ SOLN
40.0000 mg | SUBCUTANEOUS | Status: DC
  Administered 2018-10-28 – 2018-10-29 (×2): 40 mg via SUBCUTANEOUS
  Filled 2018-10-28 (×2): qty 0.4

## 2018-10-28 MED ORDER — ONDANSETRON HCL 4 MG PO TABS: 4.0000 mg | ORAL_TABLET | Freq: Four times a day (QID) | ORAL | Status: DC | PRN

## 2018-10-28 MED ORDER — ONDANSETRON HCL 4 MG/2ML IJ SOLN: 4.0000 mg | Freq: Four times a day (QID) | INTRAMUSCULAR | Status: DC | PRN

## 2018-10-28 MED ORDER — DIAZEPAM 5 MG PO TABS
5.0000 mg | ORAL_TABLET | Freq: Once | ORAL | Status: AC
  Administered 2018-10-28: 5 mg via ORAL
  Filled 2018-10-28: qty 1

## 2018-10-28 MED ORDER — ONDANSETRON HCL 4 MG/2ML IJ SOLN
4.0000 mg | Freq: Once | INTRAMUSCULAR | Status: AC
  Administered 2018-10-28: 4 mg via INTRAVENOUS
  Filled 2018-10-28: qty 2

## 2018-10-28 MED ORDER — IRBESARTAN 75 MG PO TABS
75.0000 mg | ORAL_TABLET | Freq: Every day | ORAL | Status: DC
  Administered 2018-10-29 – 2018-10-30 (×2): 75 mg via ORAL
  Filled 2018-10-28 (×2): qty 1

## 2018-10-28 MED ORDER — KCL IN DEXTROSE-NACL 20-5-0.45 MEQ/L-%-% IV SOLN
Freq: Once | INTRAVENOUS | Status: AC
  Administered 2018-10-28: 12:00:00 via INTRAVENOUS
  Filled 2018-10-28: qty 1000

## 2018-10-28 NOTE — ED Provider Notes (Signed)
[facility] EMERGENCY DEPARTMENT Provider Note   CSN: [id number] Arrival date & time: 10/28/18  9371    History   Chief Complaint Chief Complaint  Patient presents with   Dizziness   Nausea   Emesis    HPI Paula Pollard is a 85 y.o. female.     HPI Presents concern of dizziness, nausea, vomiting. Patient has a history of vertigo, typically this is well controlled with meclizine. Today, however, the patient awoke with more dizziness than usual, more nausea than usual. She has had several episodes of vomiting, has been intolerant of her meclizine. She notes that she does feel somewhat better than on awakening, but with upright positioning, and activity, head motion, she feels increasing dizziness. No weakness in any extremity, no confusion, no disorientation. No recent other medication change, diet change, activity change. Past Medical History:  Diagnosis Date   Anemia    "in past as a young girl"   Anxiety    Arthritis    Asthma    Bronchitis    hx of   Cataract    bilaterally, "no surgery at this time"   Diabetes mellitus without complication ([place])    pre diabetic   Dysrhythmia    when gets excited   GERD (gastroesophageal reflux disease)    Headache(784.0)    hx of migraines   History of hiatal hernia    Hyperlipidemia    Hypertension    sees Dr. Terrence Dupont   Neuromuscular disorder [facility])    hx of carpal tunnel "never had surgery for"right hand    Patient Active Problem List   Diagnosis Date Noted   Vertigo 10/28/2018   Acute sinusitis 10/05/2017   Other allergic rhinitis 10/05/2017   Asthma with acute exacerbation 03/06/2015   Unexplained night sweats 03/06/2015   Cough 02/16/2015   Essential hypertension, benign 08/25/2012   Other and unspecified hyperlipidemia 08/25/2012   Benign paroxysmal positional vertigo 08/25/2012   Unspecified constipation 08/25/2012   Generalized anxiety disorder 08/25/2012     GERD (gastroesophageal reflux disease) 08/25/2012   Osteoarthritis of right hip 08/10/2012    Past Surgical History:  Procedure Laterality Date   CARDIAC CATHETERIZATION     5 years ago no problems   COLONOSCOPY  07/2018   polypectomy x 5   DILATATION & CURETTAGE/HYSTEROSCOPY WITH MYOSURE N/A 01/06/2018   Procedure: DILATATION & CURETTAGE/HYSTEROSCOPY WITH MYOSURE;  Surgeon: Servando Salina, MD;  Location: [facility];  Service: Gynecology;  Laterality: N/A;   DOPPLER ECHOCARDIOGRAPHY     hx of   NECK SURGERY     2002, cyst removed,    removal of toe nails     both feet   TOTAL HIP ARTHROPLASTY Right 08/08/2012   Dr Mayer Camel   TOTAL HIP ARTHROPLASTY Right 08/08/2012   Procedure: TOTAL HIP ARTHROPLASTY;  Surgeon: Kerin Salen, MD;  Location: [facility];  Service: Orthopedics;  Laterality: Right;   TUBAL LIGATION     1970     OB History   No obstetric history on file.      Home Medications    Prior to Admission medications   Medication Sig Start Date End Date Taking? Authorizing Provider  Ascorbic Acid (VITAMIN C PO) Take 1 tablet by mouth daily.    Yes [provider]  aspirin EC 81 MG tablet Take 81 mg by mouth daily.   Yes [provider]  atorvastatin (LIPITOR) 20 MG tablet Take 20 mg by mouth every evening.  Yes [provider]  Calcium 200 MG TABS Take 200 mg by mouth daily.   Yes [provider]  cholecalciferol (VITAMIN D) 400 units TABS tablet Take 1,200 Units by mouth daily.   Yes [provider]  Cyanocobalamin (B-12 PO) Take 1 tablet by mouth as needed (for energy).   Yes [provider]  esomeprazole (NEXIUM) 20 MG capsule Take 20 mg by mouth as needed (for heartburn).   Yes [provider]  fexofenadine (ALLEGRA) 180 MG tablet Take 180 mg by mouth daily as needed for allergies.    Yes [provider]  fluticasone (FLONASE) 50 MCG/ACT nasal spray Place 2 sprays into both nostrils daily as  needed for allergies or rhinitis. Patient taking differently: Place 1 spray into both nostrils daily as needed for allergies or rhinitis.  10/05/17  Yes Bobbitt, Sedalia Muta, MD  fluticasone furoate-vilanterol (BREO ELLIPTA) 200-25 MCG/INH AEPB Inhale 1 puff into the lungs daily. 10/18/18  Yes Glendale Chard, MD  LORazepam (ATIVAN) 0.5 MG tablet Take 0.5 mg by mouth every 8 (eight) hours.   Yes [provider]  meclizine (ANTIVERT) 25 MG tablet Take 0.5 tablets (12.5 mg total) by mouth every morning. 08/25/12  Yes Medina-Vargas, Monina C, NP  meloxicam (MOBIC) 7.5 MG tablet One tab po qd prn Patient taking differently: Take 7.5 mg by mouth daily as needed for pain. One tab po qd prn 10/18/18  Yes Glendale Chard, MD  Multiple Vitamins-Minerals (ECHINACEA ACZ PO) Take 1 capsule by mouth as needed (for immune support).   Yes [provider]  nebivolol (BYSTOLIC) 5 MG tablet Take 1 tablet (5 mg total) by mouth daily. 08/25/12  Yes Medina-Vargas, Monina C, NP  Polyethyl Glycol-Propyl Glycol (SYSTANE) 0.4-0.3 % SOLN Apply 1 drop to eye 2 (two) times a day.    Yes [provider]  SALINE NASAL SPRAY NA Place 1 puff into the nose at bedtime.   Yes [provider]  telmisartan (MICARDIS) 20 MG tablet Take 20 mg by mouth daily. 09/16/17  Yes [provider]  triamcinolone cream (KENALOG) 0.1 % Apply 1 application topically 2 (two) times daily as needed. 10/18/18  Yes Glendale Chard, MD  triamterene-hydrochlorothiazide (MAXZIDE-25) 37.5-25 MG tablet Take 1 tablet by mouth daily.   Yes [provider]  vitamin E 400 UNIT capsule Take 400 Units by mouth daily.   Yes [provider]  Calcium Carbonate-Vitamin D (CALCIUM-VITAMIN D3 PO) Take 1 tablet by mouth every morning. 1200mg  of calcium  08/25/12  [provider]    Family History Family History  Problem Relation Age of Onset   Heart attack Mother    Heart attack Father    Stroke Father     Stroke Daughter    Colon cancer Sister     Social History Social History   Tobacco Use   Smoking status: Never Smoker   Smokeless tobacco: Never Used  Substance Use Topics   Alcohol use: No   Drug use: No     Allergies   Codeine   Review of Systems Review of Systems  Constitutional:       Per HPI, otherwise negative  HENT:       Per HPI, otherwise negative  Respiratory:       Per HPI, otherwise negative  Cardiovascular:       Occasional discomfort sternal area, associated with vomiting  Gastrointestinal: Positive for nausea and vomiting. Negative for abdominal pain.  Endocrine:  Negative aside from HPI  Genitourinary:       Neg aside from HPI   Musculoskeletal:       Per HPI, otherwise negative  Skin: Negative.   Neurological: Positive for dizziness. Negative for syncope.     Physical Exam Updated Vital Signs BP (!) 128/57    Pulse 71    Temp 98.2 F (36.8 C) (Oral)    Resp 18    Ht 5\' 3"  (1.6 m)    Wt 79 kg    SpO2 97%    BMI 30.85 kg/m   Physical Exam Vitals signs and nursing note reviewed.  Constitutional:      General: She is not in acute distress.    Appearance: She is well-developed.  HENT:     Head: Normocephalic and atraumatic.  Eyes:     Conjunctiva/sclera: Conjunctivae normal.  Cardiovascular:     Rate and Rhythm: Normal rate and regular rhythm.  Pulmonary:     Effort: Pulmonary effort is normal. No respiratory distress.     Breath sounds: Normal breath sounds. No stridor.  Abdominal:     General: There is no distension.  Skin:    General: Skin is warm and dry.  Neurological:     Mental Status: She is alert and oriented to person, place, and time.     Cranial Nerves: No cranial nerve deficit or facial asymmetry.     Motor: No weakness, tremor, atrophy or abnormal muscle tone.     Comments: When the patient sits upright, and has head rotation she feels onset of dizziness.      ED Treatments / Results  Labs (all labs  ordered are listed, but only abnormal results are displayed) Labs Reviewed  COMPREHENSIVE METABOLIC PANEL - Abnormal; Notable for the following components:      Result Value   Potassium 2.5 (*)    Chloride 114 (*)    CO2 20 (*)    Calcium 6.6 (*)    Total Protein 4.9 (*)    Albumin 2.5 (*)    All other components within normal limits  CBC WITH DIFFERENTIAL/PLATELET - Abnormal; Notable for the following components:   RBC 3.62 (*)    Hemoglobin 10.5 (*)    HCT 33.5 (*)    All other components within normal limits  URINALYSIS, COMPLETE (UACMP) WITH MICROSCOPIC - Abnormal; Notable for the following components:   Bacteria, UA RARE (*)    All other components within normal limits  SARS CORONAVIRUS 2 (HOSPITAL ORDER, [facility])  URINE CULTURE  PROTIME-INR  CALCIUM, IONIZED  CBG MONITORING, ED    EKG EKG Interpretation  Date/Time:  Friday Oct 28 2018 10:05:39 EDT Ventricular Rate:  68 PR Interval:    QRS Duration: 161 QT Interval:  464 QTC Calculation: 494 R Axis:   -23 Text Interpretation:  Sinus rhythm Prolonged PR interval Left bundle branch block No significant change since last tracing Abnormal ekg Confirmed by Carmin Muskrat [phone]) on 10/28/2018 11:07:28 AM   Radiology Dg Chest Port 1 View  Result Date: 10/28/2018 CLINICAL DATA:  Altered level of consciousness. Vertigo/dizziness with chest pain. History of hypertension and diabetes. EXAM: PORTABLE CHEST 1 VIEW COMPARISON:  Radiographs 02/16/2015 and 12/26/2014. FINDINGS: 1006 hours. The heart size and mediastinal contours are stable. There is mild aortic atherosclerosis. There is a lesser degree of inspiration, but no airspace disease, edema, pleural effusion or pneumothorax. No acute osseous findings are evident. Telemetry leads overlie the chest. IMPRESSION: No  active cardiopulmonary process. Electronically Signed   By: Richardean Sale M.D.   On: 10/28/2018 10:16    Procedures Procedures  (including critical care time)  Medications Ordered in ED Medications  sodium chloride 0.9 % bolus 1,000 mL (0 mLs Intravenous Stopped 10/28/18 1129)    And  0.9 %  sodium chloride infusion (has no administration in time range)  ondansetron (ZOFRAN) injection 4 mg (4 mg Intravenous Given 10/28/18 1012)  dextrose 5 % and 0.45 % NaCl with KCl 20 mEq/L infusion ( Intravenous New Bag/Given 10/28/18 1130)     Initial Impression / Assessment and Plan / ED Course  I have reviewed the triage vital signs and the nursing notes.  Pertinent labs & imaging results that were available during my care of the patient were reviewed by me and considered in my medical decision making (see chart for details).    Update: Patient still has dizziness, nausea, though not actively vomiting  Update:, Labs notable for hypokalemia, 2.5, substantially below her baseline. On review given patient is in similar condition, now with minimal symptoms in supine position, but when sitting upright, with increasing dizziness. Given her persistent nausea, she Pollard receive IV potassium repletion, as well as fluids.    1:34 PM Patient's urinalysis unremarkable, remaining labs also noncontributory. Patient is coronavirus negative. This elderly female presents with dizziness, nausea, beyond her baseline intermittent vertigo. Patient is awake and alert, but has easily provoked symptoms with upright positioning. Patient's condition likely worsened by her substantial electrolyte abnormality with a potassium value of 2.5. Given this, persistent nausea, decreased ability to tolerate oral repletion, the patient required IV potassium, ongoing antiemetics, admission for further monitoring, management.  Final Clinical Impressions(s) / ED Diagnoses   Final diagnoses:  Vertigo  Hypokalemia     Carmin Muskrat, MD 10/28/18 1336

## 2018-10-28 NOTE — Plan of Care (Signed)

## 2018-10-28 NOTE — ED Triage Notes (Signed)
Pt BIB [facility] from home. Pt complaining of dizziness and nausea this AM upon waking. Pt states she rolled over in bed when the onset of dizziness occurred. Pt has a history of vertigo but states that this feels different and has lasted longer than normal. Pt takes meclozine daily but has not had relief with it today. With EMS pt experienced an episode of vomiting. Pt received 4 mg Zofran via EMS.

## 2018-10-28 NOTE — H&P (Signed)
History and Physical    Paula Pollard DOB: [birth year] DOA: 10/28/2018  PCP: Glendale Chard, MD Consultants:  Jana Hakim - cardiology Patient coming from:  Home - lives with son; [facility]: Daughters and son, [phone]  Chief Complaint: Vertigo  HPI: Paula Pollard is a 85 y.o. female with medical history significant of HTN; HLD; and DM presenting with vertigo.  She was doing good other than sinus problems since March, for which she is taking medication.  This AM, her right nostril was stopped up.  She went to the bathroom and came back.  She turned over and got dizzy and has gotten dizzy ever since.  She has a h/o vertigo before, but it has never been this bad or lasted this long.  Normally, she takes an aspirin and lies down and it goes away.  She does have n/v when she gets vertigo.  She has vomited TNTC today while at home, but she has not had further vomiting in the ER.   ED Course:  N/V, hypokalemia - likely vertigo.  Symptomatic when sitting/standing.  K+ is 2.5.  Giving IVF with KCl, not tolerating PO.  Review of Systems: As per HPI; otherwise review of systems reviewed and negative.   Ambulatory Status:  Ambulates without assistance  Past Medical History:  Diagnosis Date   Anemia    "in past as a young girl"   Anxiety    Arthritis    Asthma    Bronchitis    hx of   Cataract    bilaterally, "no surgery at this time"   Diabetes mellitus without complication ([place])    pre diabetic   Dysrhythmia    when gets excited   GERD (gastroesophageal reflux disease)    Headache(784.0)    hx of migraines   History of hiatal hernia    Hyperlipidemia    Hypertension    sees Dr. Terrence Dupont   Neuromuscular disorder [facility])    hx of carpal tunnel "never had surgery for"right hand    Past Surgical History:  Procedure Laterality Date   CARDIAC CATHETERIZATION     5 years ago no problems   COLONOSCOPY  07/2018   polypectomy x 5    DILATATION & CURETTAGE/HYSTEROSCOPY WITH MYOSURE N/A 01/06/2018   Procedure: DILATATION & CURETTAGE/HYSTEROSCOPY WITH MYOSURE;  Surgeon: Servando Salina, MD;  Location: [facility];  Service: Gynecology;  Laterality: N/A;   DOPPLER ECHOCARDIOGRAPHY     hx of   NECK SURGERY     2002, cyst removed,    removal of toe nails     both feet   TOTAL HIP ARTHROPLASTY Right 08/08/2012   Dr Mayer Camel   TOTAL HIP ARTHROPLASTY Right 08/08/2012   Procedure: TOTAL HIP ARTHROPLASTY;  Surgeon: Kerin Salen, MD;  Location: [facility] [place];  Service: Orthopedics;  Laterality: Right;   TUBAL LIGATION     1970    Social History   Socioeconomic History   Marital status: Divorced    Spouse name: Not on file   Number of children: Not on file   Years of education: Not on file   Highest education level: Not on file  Occupational History   Occupation: retired  Scientist, product/process development strain: Not hard at all   Food insecurity:    Worry: Never true    Inability: Never true   Transportation needs:    Medical: No    Non-medical: No  Tobacco Use   Smoking status: Never Smoker  Smokeless tobacco: Never Used  Substance and Sexual Activity   Alcohol use: No   Drug use: No   Sexual activity: Not Currently  Lifestyle   Physical activity:    Days per week: 7 days    Minutes per session: 30 min   Stress: Not at all  Relationships   Social connections:    Talks on phone: Not on file    Gets together: Not on file    Attends religious service: Not on file    Active member of club or organization: Not on file    Attends meetings of clubs or organizations: Not on file    Relationship status: Not on file   Intimate partner violence:    Fear of current or ex partner: No    Emotionally abused: No    Physically abused: No    Forced sexual activity: No  Other Topics Concern   Not on file  Social History Narrative   Not on file    Allergies  Allergen Reactions   Codeine Nausea Only  and Other (See Comments)    Reaction:Dizziness and hallucinations "makes me climb walls"    Family History  Problem Relation Age of Onset   Heart attack Mother    Heart attack Father    Stroke Father    Stroke Daughter    Colon cancer Sister     Prior to Admission medications   Medication Sig Start Date End Date Taking? Authorizing Provider  Ascorbic Acid (VITAMIN C PO) Take 1 tablet by mouth daily.    Yes [provider]  aspirin EC 81 MG tablet Take 81 mg by mouth daily.   Yes [provider]  atorvastatin (LIPITOR) 20 MG tablet Take 20 mg by mouth every evening.    Yes [provider]  Calcium 200 MG TABS Take 200 mg by mouth daily.   Yes [provider]  cholecalciferol (VITAMIN D) 400 units TABS tablet Take 1,200 Units by mouth daily.   Yes [provider]  Cyanocobalamin (B-12 PO) Take 1 tablet by mouth as needed (for energy).   Yes [provider]  esomeprazole (NEXIUM) 20 MG capsule Take 20 mg by mouth as needed (for heartburn).   Yes [provider]  fexofenadine (ALLEGRA) 180 MG tablet Take 180 mg by mouth daily as needed for allergies.    Yes [provider]  fluticasone (FLONASE) 50 MCG/ACT nasal spray Place 2 sprays into both nostrils daily as needed for allergies or rhinitis. Patient taking differently: Place 1 spray into both nostrils daily as needed for allergies or rhinitis.  10/05/17  Yes Bobbitt, Sedalia Muta, MD  fluticasone furoate-vilanterol (BREO ELLIPTA) 200-25 MCG/INH AEPB Inhale 1 puff into the lungs daily. 10/18/18  Yes Glendale Chard, MD  LORazepam (ATIVAN) 0.5 MG tablet Take 0.5 mg by mouth every 8 (eight) hours.   Yes [provider]  meclizine (ANTIVERT) 25 MG tablet Take 0.5 tablets (12.5 mg total) by mouth every morning. 08/25/12  Yes Medina-Vargas, Monina C, NP  meloxicam (MOBIC) 7.5 MG tablet One tab po qd prn Patient taking differently: Take 7.5 mg by mouth daily as  needed for pain. One tab po qd prn 10/18/18  Yes Glendale Chard, MD  Multiple Vitamins-Minerals (ECHINACEA ACZ PO) Take 1 capsule by mouth as needed (for immune support).   Yes [provider]  nebivolol (BYSTOLIC) 5 MG tablet Take 1 tablet (5 mg total) by mouth daily. 08/25/12  Yes Medina-Vargas, Monina C, NP  Polyethyl Glycol-Propyl Glycol (SYSTANE) 0.4-0.3 % SOLN Apply 1 drop to eye 2 (two) times a day.    Yes [provider]  SALINE NASAL SPRAY NA Place 1 puff into the nose at bedtime.   Yes [provider]  telmisartan (MICARDIS) 20 MG tablet Take 20 mg by mouth daily. 09/16/17  Yes [provider]  triamcinolone cream (KENALOG) 0.1 % Apply 1 application topically 2 (two) times daily as needed. 10/18/18  Yes Glendale Chard, MD  triamterene-hydrochlorothiazide (MAXZIDE-25) 37.5-25 MG tablet Take 1 tablet by mouth daily.   Yes [provider]  vitamin E 400 UNIT capsule Take 400 Units by mouth daily.   Yes [provider]  Calcium Carbonate-Vitamin D (CALCIUM-VITAMIN D3 PO) Take 1 tablet by mouth every morning. 1200mg  of calcium  08/25/12  [provider]    Physical Exam: Vitals:   10/28/18 1200 10/28/18 1215 10/28/18 1230 10/28/18 1300  BP: (!) 134/59 128/61 128/63 (!) 128/57  Pulse: 70 70 68 71  Resp: 16 (!) 21 14 18   Temp:      TempSrc:      SpO2: 98% 98% 95% 97%  Weight:      Height:          General:  Appears calm and comfortable and is NAD  Eyes:  PERRL, EOMI, normal lids, iris  ENT:  grossly normal hearing, lips & tongue, mmm  Neck:  no LAD, masses or thyromegaly; no carotid bruits  Cardiovascular:  RRR, no m/r/g. No LE edema.   Respiratory:   CTA bilaterally with no wheezes/rales/rhonchi.  Normal respiratory effort.  Abdomen:  soft, NT, ND, NABS  Skin:  no rash or induration seen on limited exam  Musculoskeletal:  grossly normal tone BUE/BLE, good ROM, no bony abnormality  Psychiatric:  grossly normal  mood and affect, speech fluent and appropriate, AOx3  Neurologic:  CN 2-12 grossly intact, moves all extremities in coordinated fashion, sensation intact    Radiological Exams on Admission: Dg Chest Port 1 View  Result Date: 10/28/2018 CLINICAL DATA:  Altered level of consciousness. Vertigo/dizziness with chest pain. History of hypertension and diabetes. EXAM: PORTABLE CHEST 1 VIEW COMPARISON:  Radiographs 02/16/2015 and 12/26/2014. FINDINGS: 1006 hours. The heart size and mediastinal contours are stable. There is mild aortic atherosclerosis. There is a lesser degree of inspiration, but no airspace disease, edema, pleural effusion or pneumothorax. No acute osseous findings are evident. Telemetry leads overlie the chest. IMPRESSION: No active cardiopulmonary process. Electronically Signed   By: Richardean Sale M.D.   On: 10/28/2018 10:16    EKG: Independently reviewed.  NSR with rate 68; LBBB; nonspecific ST changes with no evidence of acute ischemia; NSCSLT   Labs on Admission: I have personally reviewed the available labs and imaging studies at the time of the admission.  Pertinent labs:   K+ 2.5 Calcium 6.6 (corrected 7.8) Albumin 2.5 WBC 4.3 Hgb 10.5 INR 1.1 COVID negative  Assessment/Plan Principal Problem:   Vertigo Active Problems:   Essential hypertension, benign   Hypokalemia   Vertigo -Patient presenting with vertigo -Difficulty sitting up or moving around in the ER due to the severity of her symptoms -Will give Valium 5 mg PO x 1 and Meclizine prn -Will observe on Med Tele overnight -PT consult -Anticipate d/c to home tomorrow  Hypokalemia -Patient with refractory n/v at home; hypokalemia likely developed from GI losses as well as from renal loss in the setting of Diazide use -Will check Mag level -Calcium level is  also low, even when corrected; will check ionized calcium level -She was given 500 cc of IVF with 20 mEq/L in it in the ER, for 10 mEq total  replacement -Will given an additional 6 runs of 10 mEq IV KCl as well as 40 mEq PO KCl, which would be anticipated to bring her calcium back to approximately 3.6 -Will recheck K+ in AM -Will monitor on tele overnight given the critically low level at the time of presentation and risk for arrhythmias  HTN -Continue Bystolic -Hold HCTZ    Note: This patient has been tested and is negative for the novel coronavirus COVID-19.  DVT prophylaxis:  Lovenox Code Status:  Full - confirmed with patient Family Communication: None present; I attempted to reach her daughter by telephone and was unable to reach her  Disposition Plan:  Home once clinically improved Consults called: PT  Admission status: It is my clinical opinion that referral for OBSERVATION is reasonable and necessary in this patient based on the above information provided. The aforementioned taken together are felt to place the patient at high risk for further clinical deterioration. However it is anticipated that the patient may be medically stable for discharge from the hospital within 24 to 48 hours.     Karmen Bongo MD [facility]   How to contact the [place] [facility] Attending or Consulting provider [place] or covering provider during after hours [place], for this patient?  1. Check the care team in [facility] and look for a) attending/consulting TR[facility] provider listed and b) the [facility] team listed 2. Log into [email or web address] and use [place]'s universal password to access. If you do not have the password, please contact the hospital operator. 3. Locate the [facility] provider you are looking for under [facility] and page to a number that you can be directly reached. 4. If you still have difficulty reaching the provider, please page the [facility] (Director on Call) for the Hospitalists listed on amion for assistance.   10/28/2018, 4:55 PM

## 2018-10-28 NOTE — ED Notes (Signed)
Report called to [place], [place] RN

## 2018-10-28 NOTE — ED Notes (Signed)
ED TO INPATIENT HANDOFF REPORT  ED Nurse Name and Phone #: Thurmond Butts [phone]  S Name/Age/Gender Paula Pollard 85 y.o. female Room/Bed: 034C/034C  Code Status   Code Status: Not on file  Home/SNF/Other Home Patient oriented to: self, place, time and situation Is this baseline? Yes   Triage Complete: Triage complete  Chief Complaint DIZZINESS/N/V  Triage Note Pt BIB [facility] from home. Pt complaining of dizziness and nausea this AM upon waking. Pt states she rolled over in bed when the onset of dizziness occurred. Pt has a history of vertigo but states that this feels different and has lasted longer than normal. Pt takes meclozine daily but has not had relief with it today. With EMS pt experienced an episode of vomiting. Pt received 4 mg Zofran via EMS.    Allergies Allergies  Allergen Reactions  . Codeine Nausea Only and Other (See Comments)    Reaction:Dizziness and hallucinations "makes me climb walls"    Level of Care/Admitting Diagnosis ED Disposition    ED Disposition Condition [place]: [place] [[id number]]  Level of Care: Telemetry Medical [104]  I expect the patient will be discharged within 24 hours: Yes  LOW acuity---Tx typically complete <24 hrs---ACUTE conditions typically can be evaluated <24 hours---LABS likely to return to acceptable levels <24 hours---IS near functional baseline---EXPECTED to return to current living arrangement---NOT newly hypoxic: Meets criteria for 5C-Observation unit  Covid Evaluation: Screening Protocol (No Symptoms)  Diagnosis: Vertigo [573220]  Admitting Physician: Karmen Bongo [[id number]]  Attending Physician: Karmen Bongo [[id number]]  PT Class (Do Not Modify): Observation [104]  PT Acc Code (Do Not Modify): Observation [10022]       B Medical/Surgery History Past Medical History:  Diagnosis Date  . Anemia    "in past as a young girl"  . Anxiety   . Arthritis   . Asthma   . Bronchitis     hx of  . Cataract    bilaterally, "no surgery at this time"  . Diabetes mellitus without complication ([employer])    pre diabetic  . Dysrhythmia    when gets excited  . GERD (gastroesophageal reflux disease)   . Headache(784.0)    hx of migraines  . History of hiatal hernia   . Hyperlipidemia   . Hypertension    sees Dr. Terrence Dupont  . Neuromuscular disorder ([place])    hx of carpal tunnel "never had surgery for"right hand   Past Surgical History:  Procedure Laterality Date  . CARDIAC CATHETERIZATION     5 years ago no problems  . COLONOSCOPY  07/2018   polypectomy x 5  . DILATATION & CURETTAGE/HYSTEROSCOPY WITH MYOSURE N/A 01/06/2018   Procedure: DILATATION & CURETTAGE/HYSTEROSCOPY WITH MYOSURE;  Surgeon: Servando Salina, MD;  Location: [facility];  Service: Gynecology;  Laterality: N/A;  . DOPPLER ECHOCARDIOGRAPHY     hx of  . NECK SURGERY     2002, cyst removed,   . removal of toe nails     both feet  . TOTAL HIP ARTHROPLASTY Right 08/08/2012   Dr Mayer Camel  . TOTAL HIP ARTHROPLASTY Right 08/08/2012   Procedure: TOTAL HIP ARTHROPLASTY;  Surgeon: Kerin Salen, MD;  Location: [facility];  Service: Orthopedics;  Laterality: Right;  . TUBAL LIGATION     1970     A IV Location/Drains/Wounds Patient Lines/Drains/Airways Status   Active Line/Drains/Airways    Name:   Placement date:   Placement time:   Site:   Days:  Peripheral IV 10/28/18 Left Hand   10/28/18    0930    Hand   less than 1   Peripheral IV 10/28/18 Right Antecubital   10/28/18    0953    Antecubital   less than 1   Incision 08/08/12 Hip Right   08/08/12    0856     2272   Incision (Closed) 01/06/18 Perineum   01/06/18    1137     295          Intake/Output Last 24 hours No intake or output data in the 24 hours ending 10/28/18 1315  Labs/Imaging Results for orders placed or performed during the hospital encounter of 10/28/18 (from the past 48 hour(s))  CBG monitoring, ED     Status: None   Collection Time: 10/28/18  10:04 AM  Result Value Ref Range   Glucose-Capillary 91 70 - 99 mg/dL  Comprehensive metabolic panel     Status: Abnormal   Collection Time: 10/28/18 10:05 AM  Result Value Ref Range   Sodium 140 135 - 145 mmol/L   Potassium 2.5 (LL) 3.5 - 5.1 mmol/L    Comment: CRITICAL RESULT CALLED TO, READ BACK BY AND VERIFIED WITH: K.RAND,RN 1100 10/28/2018 CLARK,S    Chloride 114 (H) 98 - 111 mmol/L   CO2 20 (L) 22 - 32 mmol/L   Glucose, Bld 94 70 - 99 mg/dL   BUN 12 8 - 23 mg/dL   Creatinine, Ser 0.53 0.44 - 1.00 mg/dL   Calcium 6.6 (L) 8.9 - 10.3 mg/dL   Total Protein 4.9 (L) 6.5 - 8.1 g/dL   Albumin 2.5 (L) 3.5 - 5.0 g/dL   AST 16 15 - 41 U/L   ALT 12 0 - 44 U/L   Alkaline Phosphatase 48 38 - 126 U/L   Total Bilirubin 0.3 0.3 - 1.2 mg/dL   GFR calc non Af Amer >60 >60 mL/min   GFR calc Af Amer >60 >60 mL/min   Anion gap 6 5 - 15    Comment: Performed at [facility] [address], [place], [place] [address]  CBC WITH DIFFERENTIAL     Status: Abnormal   Collection Time: 10/28/18 10:05 AM  Result Value Ref Range   WBC 4.3 4.0 - 10.5 K/uL   RBC 3.62 (L) 3.87 - 5.11 MIL/uL   Hemoglobin 10.5 (L) 12.0 - 15.0 g/dL   HCT 33.5 (L) 36.0 - 46.0 %   MCV 92.5 80.0 - 100.0 fL   MCH 29.0 26.0 - 34.0 pg   MCHC 31.3 30.0 - 36.0 g/dL   RDW 12.6 11.5 - 15.5 %   Platelets 166 150 - 400 K/uL   nRBC 0.0 0.0 - 0.2 %   Neutrophils Relative % 70 %   Neutro Abs 3.0 1.7 - 7.7 K/uL   Lymphocytes Relative 17 %   Lymphs Abs 0.7 0.7 - 4.0 K/uL   Monocytes Relative 10 %   Monocytes Absolute 0.4 0.1 - 1.0 K/uL   Eosinophils Relative 2 %   Eosinophils Absolute 0.1 0.0 - 0.5 K/uL   Basophils Relative 1 %   Basophils Absolute 0.0 0.0 - 0.1 K/uL   Immature Granulocytes 0 %   Abs Immature Granulocytes 0.01 0.00 - 0.07 K/uL    Comment: Performed at [facility], [address], [place], [place] [address]  Protime-INR     Status: None   Collection Time: 10/28/18 10:05 AM  Result Value Ref Range  Prothrombin Time 14.1 11.4 - 15.2 seconds   INR 1.1 0.8 - 1.2    Comment: (NOTE) INR goal varies based on device and disease states. Performed at [facility], [place] [address] [place], [place] [address]   SARS Coronavirus 2 (CEPHEID - Performed in [facility] hospital lab), Hosp Order     Status: None   Collection Time: 10/28/18 11:49 AM  Result Value Ref Range   SARS Coronavirus 2 NEGATIVE NEGATIVE    Comment: (NOTE) If result is NEGATIVE SARS-CoV-2 target nucleic acids are NOT DETECTED. The SARS-CoV-2 RNA is generally detectable in upper and lower  respiratory specimens during the acute phase of infection. The lowest  concentration of SARS-CoV-2 viral copies this assay can detect is 250  copies / mL. A negative result does not preclude SARS-CoV-2 infection  and should not be used as the sole basis for treatment or other  patient management decisions.  A negative result may occur with  improper specimen collection / handling, submission of specimen other  than nasopharyngeal swab, presence of viral mutation(s) within the  areas targeted by this assay, and inadequate number of viral copies  (<250 copies / mL). A negative result must be combined with clinical  observations, patient history, and epidemiological information. If result is POSITIVE SARS-CoV-2 target nucleic acids are DETECTED. The SARS-CoV-2 RNA is generally detectable in upper and lower  respiratory specimens dur ing the acute phase of infection.  Positive  results are indicative of active infection with SARS-CoV-2.  Clinical  correlation with patient history and other diagnostic information is  necessary to determine patient infection status.  Positive results do  not rule out bacterial infection or co-infection with other viruses. If result is PRESUMPTIVE POSTIVE SARS-CoV-2 nucleic acids MAY BE PRESENT.   A presumptive positive result was obtained on the submitted specimen  and confirmed on repeat testing.   While 2019 novel coronavirus  (SARS-CoV-2) nucleic acids may be present in the submitted sample  additional confirmatory testing may be necessary for epidemiological  and / or clinical management purposes  to differentiate between  SARS-CoV-2 and other Sarbecovirus currently known to infect humans.  If clinically indicated additional testing with an alternate test  methodology [phone]) is advised. The SARS-CoV-2 RNA is generally  detectable in upper and lower respiratory sp ecimens during the acute  phase of infection. The expected result is Negative. Fact Sheet for Patients:  StrictlyIdeas.no Fact Sheet for Healthcare Providers: BankingDealers.co.za This test is not yet approved or cleared by the [place] FDA and has been authorized for detection and/or diagnosis of SARS-CoV-2 by FDA under an Emergency Use Authorization (EUA).  This EUA will remain in effect (meaning this test can be used) for the duration of the COVID-19 declaration under Section 564(b)(1) of the Act, 21 U.S.C. section 360bbb-3(b)(1), unless the authorization is terminated or revoked sooner. Performed at [facility], [place] [address], [place], [place] [address]   Urinalysis, Complete w Microscopic     Status: Abnormal   Collection Time: 10/28/18 11:55 AM  Result Value Ref Range   Color, Urine YELLOW YELLOW   APPearance CLEAR CLEAR   Specific Gravity, Urine 1.017 1.005 - 1.030   pH 7.0 5.0 - 8.0   Glucose, UA NEGATIVE NEGATIVE mg/dL   Hgb urine dipstick NEGATIVE NEGATIVE   Bilirubin Urine NEGATIVE NEGATIVE   Ketones, ur NEGATIVE NEGATIVE mg/dL   Protein, ur NEGATIVE NEGATIVE mg/dL   Nitrite NEGATIVE NEGATIVE   Leukocytes,Ua NEGATIVE NEGATIVE  RBC / HPF 0-5 0 - 5 RBC/hpf   WBC, UA 0-5 0 - 5 WBC/hpf   Bacteria, UA RARE (A) NONE SEEN   Squamous Epithelial / LPF 0-5 0 - 5   Mucus PRESENT     Comment: Performed at [facility], [place] [address], [place], [place] [address]   Dg Chest Port 1 View  Result Date: 10/28/2018 CLINICAL DATA:  Altered level of consciousness. Vertigo/dizziness with chest pain. History of hypertension and diabetes. EXAM: PORTABLE CHEST 1 VIEW COMPARISON:  Radiographs 02/16/2015 and 12/26/2014. FINDINGS: 1006 hours. The heart size and mediastinal contours are stable. There is mild aortic atherosclerosis. There is a lesser degree of inspiration, but no airspace disease, edema, pleural effusion or pneumothorax. No acute osseous findings are evident. Telemetry leads overlie the chest. IMPRESSION: No active cardiopulmonary process. Electronically Signed   By: Richardean Sale M.D.   On: 10/28/2018 10:16    Pending Labs Unresulted Labs (From admission, onward)    Start     Ordered   10/28/18 1307  Calcium, ionized  Add-on,   R     10/28/18 1306   10/28/18 0957  Urine culture  ONCE - STAT,   STAT    Question Answer Comment  List patient's active antibiotics unknown   Patient immune status Normal      10/28/18 0957          Vitals/Pain Today's Vitals   10/28/18 1145 10/28/18 1200 10/28/18 1215 10/28/18 1230  BP: (!) 143/65 (!) 134/59 128/61 128/63  Pulse: 69 70 70 68  Resp: 17 16 (!) 21 14  Temp:      TempSrc:      SpO2: 99% 98% 98% 95%  Weight:      Height:      PainSc:        Isolation Precautions No active isolations  Medications Medications  sodium chloride 0.9 % bolus 1,000 mL (1,000 mLs Intravenous New Bag/Given 10/28/18 1012)    And  0.9 %  sodium chloride infusion (has no administration in time range)  dextrose 5 % and 0.45 % NaCl with KCl 20 mEq/L infusion ( Intravenous New Bag/Given 10/28/18 1130)  ondansetron (ZOFRAN) injection 4 mg (4 mg Intravenous Given 10/28/18 1012)    Mobility walks with person assist Low fall risk   Focused Assessments Neuro Assessment Handoff:  Swallow screen pass? Yes  Cardiac Rhythm: Normal sinus rhythm NIH Stroke Scale ( + Modified Stroke Scale  Criteria)  LOC Questions (1b. )   +: Answers both questions correctly LOC Commands (1c. )   + : Performs both tasks correctly Best Gaze (2. )  +: Normal Visual (3. )  +: No visual loss Motor Arm, Left (5a. )   +: No drift Motor Arm, Right (5b. )   +: No drift Motor Leg, Left (6a. )   +: No drift Motor Leg, Right (6b. )   +: No drift Sensory (8. )   +: Normal, no sensory loss Best Language (9. )   +: No aphasia Extinction/Inattention (11.)   +: No Abnormality Modified SS Total  +: 0     Neuro Assessment: Within Defined Limits Neuro Checks:      Last Documented NIHSS Modified Score: 0 (10/28/18 1024) Has TPA been given? No If patient is a Neuro Trauma and patient is going to OR before floor call report to [place] nurse: [phone] or [phone]     R Recommendations: See Admitting Provider Note  Report given to:   Additional Notes:

## 2018-10-29 ENCOUNTER — Observation Stay (HOSPITAL_COMMUNITY): Payer: Medicare Other

## 2018-10-29 DIAGNOSIS — K219 Gastro-esophageal reflux disease without esophagitis: Secondary | ICD-10-CM | POA: Diagnosis present

## 2018-10-29 DIAGNOSIS — I10 Essential (primary) hypertension: Secondary | ICD-10-CM | POA: Diagnosis not present

## 2018-10-29 DIAGNOSIS — Z800 Family history of malignant neoplasm of digestive organs: Secondary | ICD-10-CM | POA: Diagnosis not present

## 2018-10-29 DIAGNOSIS — F419 Anxiety disorder, unspecified: Secondary | ICD-10-CM | POA: Diagnosis present

## 2018-10-29 DIAGNOSIS — R42 Dizziness and giddiness: Secondary | ICD-10-CM | POA: Diagnosis not present

## 2018-10-29 DIAGNOSIS — E876 Hypokalemia: Secondary | ICD-10-CM

## 2018-10-29 DIAGNOSIS — Z885 Allergy status to narcotic agent status: Secondary | ICD-10-CM | POA: Diagnosis not present

## 2018-10-29 DIAGNOSIS — Z7982 Long term (current) use of aspirin: Secondary | ICD-10-CM | POA: Diagnosis not present

## 2018-10-29 DIAGNOSIS — Z791 Long term (current) use of non-steroidal anti-inflammatories (NSAID): Secondary | ICD-10-CM | POA: Diagnosis not present

## 2018-10-29 DIAGNOSIS — R410 Disorientation, unspecified: Secondary | ICD-10-CM | POA: Diagnosis not present

## 2018-10-29 DIAGNOSIS — E1136 Type 2 diabetes mellitus with diabetic cataract: Secondary | ICD-10-CM | POA: Diagnosis present

## 2018-10-29 DIAGNOSIS — Z79899 Other long term (current) drug therapy: Secondary | ICD-10-CM | POA: Diagnosis not present

## 2018-10-29 DIAGNOSIS — J45909 Unspecified asthma, uncomplicated: Secondary | ICD-10-CM | POA: Diagnosis present

## 2018-10-29 DIAGNOSIS — R0609 Other forms of dyspnea: Secondary | ICD-10-CM | POA: Diagnosis not present

## 2018-10-29 DIAGNOSIS — Z7951 Long term (current) use of inhaled steroids: Secondary | ICD-10-CM | POA: Diagnosis not present

## 2018-10-29 DIAGNOSIS — Z96641 Presence of right artificial hip joint: Secondary | ICD-10-CM | POA: Diagnosis present

## 2018-10-29 DIAGNOSIS — Z823 Family history of stroke: Secondary | ICD-10-CM | POA: Diagnosis not present

## 2018-10-29 DIAGNOSIS — Z8249 Family history of ischemic heart disease and other diseases of the circulatory system: Secondary | ICD-10-CM | POA: Diagnosis not present

## 2018-10-29 DIAGNOSIS — Z20828 Contact with and (suspected) exposure to other viral communicable diseases: Secondary | ICD-10-CM | POA: Diagnosis present

## 2018-10-29 DIAGNOSIS — E785 Hyperlipidemia, unspecified: Secondary | ICD-10-CM | POA: Diagnosis present

## 2018-10-29 LAB — CBC
HCT: 34.6 % — ABNORMAL LOW (ref 36.0–46.0)
Hemoglobin: 11 g/dL — ABNORMAL LOW (ref 12.0–15.0)
MCH: 29.1 pg (ref 26.0–34.0)
MCHC: 31.8 g/dL (ref 30.0–36.0)
MCV: 91.5 fL (ref 80.0–100.0)
Platelets: 193 10*3/uL (ref 150–400)
RBC: 3.78 MIL/uL — ABNORMAL LOW (ref 3.87–5.11)
RDW: 12.9 % (ref 11.5–15.5)
WBC: 5.2 10*3/uL (ref 4.0–10.5)
nRBC: 0 % (ref 0.0–0.2)

## 2018-10-29 LAB — URINE CULTURE
Culture: <10000 — AB
Special Requests: NORMAL

## 2018-10-29 LAB — BASIC METABOLIC PANEL
Anion gap: 5 (ref 5–15)
BUN: 9 mg/dL (ref 8–23)
CO2: 24 mmol/L (ref 22–32)
Calcium: 8.6 mg/dL — ABNORMAL LOW (ref 8.9–10.3)
Chloride: 108 mmol/L (ref 98–111)
Creatinine, Ser: 0.73 mg/dL (ref 0.44–1.00)
GFR calc Af Amer: >60 mL/min (ref >60)
GFR calc non Af Amer: >60 mL/min (ref >60)
Glucose, Bld: 98 mg/dL (ref 70–99)
Potassium: 4.3 mmol/L (ref 3.5–5.1)
Sodium: 137 mmol/L (ref 135–145)

## 2018-10-29 LAB — CALCIUM, IONIZED: Calcium, Ionized, Serum: 4.9 mg/dL (ref 4.5–5.6)

## 2018-10-29 MED ORDER — MECLIZINE HCL 25 MG PO TABS: 25.0000 mg | ORAL_TABLET | Freq: Three times a day (TID) | ORAL | 0 refills | Status: DC | PRN

## 2018-10-29 MED ORDER — LORAZEPAM 0.5 MG PO TABS: 0.5000 mg | ORAL_TABLET | Freq: Three times a day (TID) | ORAL | Status: DC | PRN

## 2018-10-29 NOTE — Discharge Summary (Signed)
Physician Discharge Summary  Paula Pollard DPO:[id number] DOB: 1929/10/11 DOA: 10/28/2018  PCP: Glendale Chard, MD  Admit date: 10/28/2018 Discharge date: 10/29/2018  Admitted From: Home Disposition: Home  Recommendations for Outpatient Follow-up:  1. Follow up with PCP in 1 week with repeat CBC/BMP 2. Follow up in ED if symptoms worsen or new appear   Home Health: Home with PT Equipment/Devices: None  Discharge Condition: Stable CODE STATUS: Full Diet recommendation: Heart healthy/carb modified  Brief/Interim Summary: 85 year old female with history of hypertension, hyperlipidemia and diet-controlled diabetes mellitus presented with vertigo along with nausea and vomiting.  She was hypokalemic on presentation.  She was given IV fluids.  Her condition has improved.  No further vomiting since admission.  Vertigo is much improved.  After PT evaluation today, she will be discharged home with home health PT.  Discharge Diagnoses:  Principal Problem:   Vertigo Active Problems:   Essential hypertension, benign   Hypokalemia  Vertigo along with nausea and vomiting -Questionable cause. -Much improved since admission.  No more vomiting.  Vertigo has much improved. - After PT evaluation today, she will be discharged home with home health PT. -Can use PRN meclizine -COVID-19 testing negative on admission  Hypokalemia -Resolved with replacement.  Triamterene hydrochlorothiazide will remain on hold.  Outpatient follow-up with PCP  Hypertension -Continue Bystolic and telmisartan.  Hold triamterene hydrochlorothiazide.  -Outpatient follow-up   Discharge Instructions  Discharge Instructions    Diet - low sodium heart healthy   Complete by:  As directed    Increase activity slowly   Complete by:  As directed      Allergies as of 10/29/2018      Reactions   Codeine Nausea Only, Other (See Comments)   Reaction:Dizziness and hallucinations "makes me climb walls"      Medication  List    STOP taking these medications   SALINE NASAL SPRAY NA   triamterene-hydrochlorothiazide 37.5-25 MG tablet Commonly known as:  MAXZIDE-25     TAKE these medications   aspirin EC 81 MG tablet Take 81 mg by mouth daily.   atorvastatin 20 MG tablet Commonly known as:  LIPITOR Take 20 mg by mouth every evening.   B-12 PO Take 1 tablet by mouth as needed (for energy).   Calcium 200 MG Tabs Take 200 mg by mouth daily.   cholecalciferol 10 MCG (400 UNIT) Tabs tablet Commonly known as:  VITAMIN D3 Take 1,200 Units by mouth daily.   ECHINACEA ACZ PO Take 1 capsule by mouth as needed (for immune support).   esomeprazole 20 MG capsule Commonly known as:  NEXIUM Take 20 mg by mouth as needed (for heartburn).   fexofenadine 180 MG tablet Commonly known as:  ALLEGRA Take 180 mg by mouth daily as needed for allergies.   fluticasone 50 MCG/ACT nasal spray Commonly known as:  FLONASE Place 2 sprays into both nostrils daily as needed for allergies or rhinitis. What changed:  how much to take   fluticasone furoate-vilanterol 200-25 MCG/INH Aepb Commonly known as:  Breo Ellipta Inhale 1 puff into the lungs daily.   LORazepam 0.5 MG tablet Commonly known as:  ATIVAN Take 0.5 mg by mouth every 8 (eight) hours.   meclizine 25 MG tablet Commonly known as:  ANTIVERT Take 1 tablet (25 mg total) by mouth 3 (three) times daily as needed for dizziness. What changed:    how much to take  when to take this  reasons to take this   meloxicam 7.5 MG  tablet Commonly known as:  Mobic One tab po qd prn What changed:    how much to take  how to take this  when to take this  reasons to take this   nebivolol 5 MG tablet Commonly known as:  BYSTOLIC Take 1 tablet (5 mg total) by mouth daily.   Systane 0.4-0.3 % Soln Generic drug:  Polyethyl Glycol-Propyl Glycol Apply 1 drop to eye 2 (two) times a day.   telmisartan 20 MG tablet Commonly known as:  MICARDIS Take 20  mg by mouth daily.   triamcinolone cream 0.1 % Commonly known as:  KENALOG Apply 1 application topically 2 (two) times daily as needed.   VITAMIN C PO Take 1 tablet by mouth daily.   vitamin E 400 UNIT capsule Take 400 Units by mouth daily.      Follow-up Information    Glendale Chard, MD. Schedule an appointment as soon as possible for a visit in 1 week(s).   Specialty:  Internal Medicine Why:  With CBC/BMP Contact information: [address] [place] [address] [phone]          Allergies  Allergen Reactions  . Codeine Nausea Only and Other (See Comments)    Reaction:Dizziness and hallucinations "makes me climb walls"    Consultations:  None   Procedures/Studies: Dg Chest Port 1 View  Result Date: 10/28/2018 CLINICAL DATA:  Altered level of consciousness. Vertigo/dizziness with chest pain. History of hypertension and diabetes. EXAM: PORTABLE CHEST 1 VIEW COMPARISON:  Radiographs 02/16/2015 and 12/26/2014. FINDINGS: 1006 hours. The heart size and mediastinal contours are stable. There is mild aortic atherosclerosis. There is a lesser degree of inspiration, but no airspace disease, edema, pleural effusion or pneumothorax. No acute osseous findings are evident. Telemetry leads overlie the chest. IMPRESSION: No active cardiopulmonary process. Electronically Signed   By: Richardean Sale M.D.   On: 10/28/2018 10:16       Subjective: Patient seen and examined at bedside.  She feels much better.  She does not feel dizzy currently.  No overnight fever or vomiting.  She wants to go home today.  Discharge Exam: Vitals:   10/29/18 0500 10/29/18 0707  BP: 139/76   Pulse: 75   Resp:    Temp: 98.1 F (36.7 C)   SpO2: 97% 98%    General: Pt is alert, awake, not in acute distress Cardiovascular: rate controlled, S1/S2 + Respiratory: bilateral decreased breath sounds at bases Abdominal: Soft, NT, ND, bowel sounds + Extremities: no edema, no  cyanosis    The results of significant diagnostics from this hospitalization (including imaging, microbiology, ancillary and laboratory) are listed below for reference.     Microbiology: Recent Results (from the past 240 hour(s))  SARS Coronavirus 2 (CEPHEID - Performed in [facility] hospital lab), Hosp Order     Status: None   Collection Time: 10/28/18 11:49 AM  Result Value Ref Range Status   SARS Coronavirus 2 NEGATIVE NEGATIVE Final    Comment: (NOTE) If result is NEGATIVE SARS-CoV-2 target nucleic acids are NOT DETECTED. The SARS-CoV-2 RNA is generally detectable in upper and lower  respiratory specimens during the acute phase of infection. The lowest  concentration of SARS-CoV-2 viral copies this assay can detect is 250  copies / mL. A negative result does not preclude SARS-CoV-2 infection  and should not be used as the sole basis for treatment or other  patient management decisions.  A negative result may occur with  improper specimen collection /  handling, submission of specimen other  than nasopharyngeal swab, presence of viral mutation(s) within the  areas targeted by this assay, and inadequate number of viral copies  (<250 copies / mL). A negative result must be combined with clinical  observations, patient history, and epidemiological information. If result is POSITIVE SARS-CoV-2 target nucleic acids are DETECTED. The SARS-CoV-2 RNA is generally detectable in upper and lower  respiratory specimens dur ing the acute phase of infection.  Positive  results are indicative of active infection with SARS-CoV-2.  Clinical  correlation with patient history and other diagnostic information is  necessary to determine patient infection status.  Positive results do  not rule out bacterial infection or co-infection with other viruses. If result is PRESUMPTIVE POSTIVE SARS-CoV-2 nucleic acids MAY BE PRESENT.   A presumptive positive result was obtained on the submitted specimen   and confirmed on repeat testing.  While 2019 novel coronavirus  (SARS-CoV-2) nucleic acids may be present in the submitted sample  additional confirmatory testing may be necessary for epidemiological  and / or clinical management purposes  to differentiate between  SARS-CoV-2 and other Sarbecovirus currently known to infect humans.  If clinically indicated additional testing with an alternate test  methodology [phone]) is advised. The SARS-CoV-2 RNA is generally  detectable in upper and lower respiratory sp ecimens during the acute  phase of infection. The expected result is Negative. Fact Sheet for Patients:  StrictlyIdeas.no Fact Sheet for Healthcare Providers: BankingDealers.co.za This test is not yet approved or cleared by the [place] FDA and has been authorized for detection and/or diagnosis of SARS-CoV-2 by FDA under an Emergency Use Authorization (EUA).  This EUA will remain in effect (meaning this test can be used) for the duration of the COVID-19 declaration under Section 564(b)(1) of the Act, 21 U.S.C. section 360bbb-3(b)(1), unless the authorization is terminated or revoked sooner. Performed at [facility], [place] [address] [place], [place] [address]      Labs: BNP (last 3 results) No results for input(s): BNP in the last 8760 hours. Basic Metabolic Panel: Recent Labs  Lab 10/28/18 1005 10/29/18 0325  NA 140 137  K 2.5* 4.3  CL 114* 108  CO2 20* 24  GLUCOSE 94 98  BUN 12 9  CREATININE 0.53 0.73  CALCIUM 6.6* 8.6*  MG 1.5*  --    Liver Function Tests: Recent Labs  Lab 10/28/18 1005  AST 16  ALT 12  ALKPHOS 48  BILITOT 0.3  PROT 4.9*  ALBUMIN 2.5*   No results for input(s): LIPASE, AMYLASE in the last 168 hours. No results for input(s): AMMONIA in the last 168 hours. CBC: Recent Labs  Lab 10/28/18 1005 10/29/18 0325  WBC 4.3 5.2  NEUTROABS 3.0  --   HGB 10.5* 11.0*  HCT 33.5* 34.6*   MCV 92.5 91.5  PLT 166 193   Cardiac Enzymes: No results for input(s): CKTOTAL, CKMB, CKMBINDEX, TROPONINI in the last 168 hours. BNP: Invalid input(s): POCBNP CBG: Recent Labs  Lab 10/28/18 1004  GLUCAP 91   D-Dimer No results for input(s): DDIMER in the last 72 hours. Hgb A1c No results for input(s): HGBA1C in the last 72 hours. Lipid Profile No results for input(s): CHOL, HDL, LDLCALC, TRIG, CHOLHDL, LDLDIRECT in the last 72 hours. Thyroid function studies No results for input(s): TSH, T4TOTAL, T3FREE, THYROIDAB in the last 72 hours.  Invalid input(s): FREET3 Anemia work up No results for input(s): VITAMINB12, FOLATE, FERRITIN, TIBC, IRON, RETICCTPCT in the last 61  hours. Urinalysis    Component Value Date/Time   COLORURINE YELLOW 10/28/2018 1155   APPEARANCEUR CLEAR 10/28/2018 1155   LABSPEC 1.017 10/28/2018 1155   PHURINE 7.0 10/28/2018 1155   GLUCOSEU NEGATIVE 10/28/2018 1155   HGBUR NEGATIVE 10/28/2018 [place] 10/28/2018 1155   [place] 10/28/2018 1155   PROTEINUR NEGATIVE 10/28/2018 1155   UROBILINOGEN 0.2 08/08/2012 0920   NITRITE NEGATIVE 10/28/2018 1155   LEUKOCYTESUR NEGATIVE 10/28/2018 1155   Sepsis Labs Invalid input(s): PROCALCITONIN,  WBC,  LACTICIDVEN Microbiology Recent Results (from the past 240 hour(s))  SARS Coronavirus 2 (CEPHEID - Performed in [facility] hospital lab), Hosp Order     Status: None   Collection Time: 10/28/18 11:49 AM  Result Value Ref Range Status   SARS Coronavirus 2 NEGATIVE NEGATIVE Final    Comment: (NOTE) If result is NEGATIVE SARS-CoV-2 target nucleic acids are NOT DETECTED. The SARS-CoV-2 RNA is generally detectable in upper and lower  respiratory specimens during the acute phase of infection. The lowest  concentration of SARS-CoV-2 viral copies this assay can detect is 250  copies / mL. A negative result does not preclude SARS-CoV-2 infection  and should not be used as the sole  basis for treatment or other  patient management decisions.  A negative result may occur with  improper specimen collection / handling, submission of specimen other  than nasopharyngeal swab, presence of viral mutation(s) within the  areas targeted by this assay, and inadequate number of viral copies  (<250 copies / mL). A negative result must be combined with clinical  observations, patient history, and epidemiological information. If result is POSITIVE SARS-CoV-2 target nucleic acids are DETECTED. The SARS-CoV-2 RNA is generally detectable in upper and lower  respiratory specimens dur ing the acute phase of infection.  Positive  results are indicative of active infection with SARS-CoV-2.  Clinical  correlation with patient history and other diagnostic information is  necessary to determine patient infection status.  Positive results do  not rule out bacterial infection or co-infection with other viruses. If result is PRESUMPTIVE POSTIVE SARS-CoV-2 nucleic acids MAY BE PRESENT.   A presumptive positive result was obtained on the submitted specimen  and confirmed on repeat testing.  While 2019 novel coronavirus  (SARS-CoV-2) nucleic acids may be present in the submitted sample  additional confirmatory testing may be necessary for epidemiological  and / or clinical management purposes  to differentiate between  SARS-CoV-2 and other Sarbecovirus currently known to infect humans.  If clinically indicated additional testing with an alternate test  methodology [phone]) is advised. The SARS-CoV-2 RNA is generally  detectable in upper and lower respiratory sp ecimens during the acute  phase of infection. The expected result is Negative. Fact Sheet for Patients:  StrictlyIdeas.no Fact Sheet for Healthcare Providers: BankingDealers.co.za This test is not yet approved or cleared by the [place] FDA and has been authorized for detection  and/or diagnosis of SARS-CoV-2 by FDA under an Emergency Use Authorization (EUA).  This EUA will remain in effect (meaning this test can be used) for the duration of the COVID-19 declaration under Section 564(b)(1) of the Act, 21 U.S.C. section 360bbb-3(b)(1), unless the authorization is terminated or revoked sooner. Performed at [facility], [place] [address], [place], [place] [address]      Time coordinating discharge: 35 minutes  SIGNED:   Aline August, MD  [facility] 10/29/2018, 9:05 AM

## 2018-10-29 NOTE — TOC Transition Note (Signed)
Transition of Care [facility]) - CM/SW Discharge Note   Patient Details  Name: Paula Pollard MRN: [id number] Date of Birth: 01-29-1930  Transition of Care [facility]) CM/SW Contact:  Carles Collet, RN Phone Number: 10/29/2018, 9:22 AM   Clinical Narrative:    Spoke w patient, she declined [facility]. She states that she does her own exercises at home everyday, and rides her exercise bike as well. She states that she has a RW and shower from her hip surgery avaialbale to her at home that she does not use.     Final next level of care: Home/Self Care Barriers to Discharge: No Barriers Identified   Patient Goals and CMS Choice Patient states their goals for this hospitalization and ongoing recovery are:: to return home      Discharge Placement                       Discharge Plan and Services                          HH Arranged: Refused HH          Social Determinants of Health (SDOH) Interventions     Readmission Risk Interventions No flowsheet data found.

## 2018-10-29 NOTE — Progress Notes (Signed)
Pt's family called to check on pt. Pt's daughter, Paula Pollard called and asked if the doctor could update her. Paula Pollard's number placed on chart [phone]).

## 2018-10-29 NOTE — Evaluation (Signed)
Physical Therapy Evaluation Patient Details Name: Paula Pollard MRN: [id number] DOB: [birth year] Today's Date: 10/29/2018   History of Present Illness  Pt is an 85 y/o female who presents with vertigo and vomiting. PMH significant for carpal tunnel R hand, HTN, hiatal hernia, migraines, dysrhythmia, DM, cataracts (no sugery at this time), asthma, vertigo (pt reports taking Meclizine daily).  Clinical Impression  Pt admitted with above diagnosis. Pt currently with functional limitations due to the deficits listed below (see PT Problem List). At the time of PT eval pt was able to perform transfers and ambulation with occasional min guard assist but no physical assistance to recover from unsteadiness. Vestibular assessment completed and outlined below. Overall BPPV negative. Mild symptoms with horizontal canal testing (L), however symptoms intermittent and not consistent, so did not treat. Pt reports she feels back to baseline of function, reporting she has some dizziness with sit>stand all of the time and today feels no differently. Recommended HHPT to follow up but pt declined. Feel she could benefit from outpatient vestibular follow up when able (not telehealth appropriate, and not likely she could get in to outpatient at this moment 2 pandemic). Acutely, pt will benefit from skilled PT to increase their independence and safety with mobility to allow discharge to the venue listed below.     Vestibular Assessment - 10/29/18 0001      Vestibular Assessment   General Observation  Pt appears comfortable in bed and eager to get up and participate with therapy.       Symptom Behavior   Subjective history of current problem  Pt reports she has had vertigo for "a long time" and takes Meclizine daily at baseline. Pt reports that she felt normal until she rolled to the side in bed and became slightly dizzy - rolled back to supine and became severely dizzy, lasting several hours and with multiple episodes  of vomiting.     Type of Dizziness   Spinning    Frequency of Dizziness  Pt reports when she rolls over or sits up, however seemed improved during session.     Duration of Dizziness  initially it was hours. Once admitted in the hospital pt reports minutes.     Symptom Nature  Positional    Aggravating Factors  Rolling to right;Rolling to left    Relieving Factors  Lying supine;Closing eyes;Head stationary    Progression of Symptoms  Better    History of similar episodes  Yes - takes Meclizine daily at baseline      Oculomotor Exam   Oculomotor Alignment  Abnormal    Spontaneous  Right beating nystagmus    Smooth Pursuits  Saccades    Saccades  Overshoots;Poor trajectory;Dysmetria      Oculomotor Exam-Fixation Suppressed    Ocular Alignment  Exotropia strabismus of R eye    Left Head Impulse  negative    Right Head Impulse  positive      Positional Testing   Dix-Hallpike  Dix-Hallpike Right;Dix-Hallpike Left    Horizontal Canal Testing  Horizontal Canal Right;Horizontal Canal Left      Dix-Hallpike Right   Dix-Hallpike Right Duration  Negative    Dix-Hallpike Right Symptoms  No nystagmus      Dix-Hallpike Left   Dix-Hallpike Left Duration  Negative    Dix-Hallpike Left Symptoms  No nystagmus      Horizontal Canal Right   Horizontal Canal Right Duration  Negative    Horizontal Canal Right Symptoms  Normal  Horizontal Canal Left   Horizontal Canal Left Duration  <30 seconds - reportedly very mild   with recurrence of symptoms with return to supine   Horizontal Canal Left Symptoms  Normal          Follow Up Recommendations No PT follow up;Supervision for mobility/OOB    Equipment Recommendations  None recommended by PT    Recommendations for Other Services       Precautions / Restrictions Precautions Precautions: Fall Restrictions Weight Bearing Restrictions: No      Mobility  Bed Mobility Overal bed mobility: Modified Independent                 Transfers Overall transfer level: Modified independent Equipment used: None                Ambulation/Gait Ambulation/Gait assistance: Min guard Gait Distance (Feet): 250 Feet Assistive device: None Gait Pattern/deviations: Step-through pattern;Decreased stride length;Drifts right/left Gait velocity: Decreased Gait velocity interpretation: <1.8 ft/sec, indicate of risk for recurrent falls General Gait Details: Occasional unsteadiness to the L but pt able to recover without assistance. hands-on guarding throughout for safety.   Stairs            Wheelchair Mobility    Modified Rankin (Stroke Patients Only)       Balance Overall balance assessment: Needs assistance Sitting-balance support: Feet supported;No upper extremity supported Sitting balance-Leahy Scale: Good     Standing balance support: No upper extremity supported Standing balance-Leahy Scale: Poor                               Pertinent Vitals/Pain Pain Assessment: No/denies pain    Home Living Family/patient expects to be discharged to:: Private residence Living Arrangements: Children(Son living with her for the past 6 months. ) Available Help at Discharge: Family Type of Home: House Home Access: Stairs to enter Entrance Stairs-Rails: Right Entrance Stairs-Number of Steps: 2-3 Home Layout: One level Home Equipment: Cane - single point      Prior Function Level of Independence: Independent         Comments: Pt reports she drives and can do her own grocery shopping. Keeps a cane in the car just in case she needs it but does not use it. Independent with ADL's.      Hand Dominance   Dominant Hand: Right    Extremity/Trunk Assessment   Upper Extremity Assessment Upper Extremity Assessment: Overall WFL for tasks assessed    Lower Extremity Assessment Lower Extremity Assessment: Generalized weakness    Cervical / Trunk Assessment Cervical / Trunk Assessment: Other  exceptions Cervical / Trunk Exceptions: Forward head posture with rounded shoulders  Communication   Communication: No difficulties  Cognition Arousal/Alertness: Awake/alert Behavior During Therapy: WFL for tasks assessed/performed Overall Cognitive Status: Within Functional Limits for tasks assessed                                        General Comments      Exercises     Assessment/Plan    PT Assessment Patient needs continued PT services  PT Problem List Decreased strength;Decreased activity tolerance;Decreased balance;Decreased mobility;Decreased knowledge of use of DME;Decreased safety awareness       PT Treatment Interventions DME instruction;Gait training;Stair training;Functional mobility training;Therapeutic activities;Therapeutic exercise;Neuromuscular re-education;Patient/family education    PT Goals (Current goals can be found  in the Care Plan section)  Acute Rehab PT Goals Patient Stated Goal: Home today.  PT Goal Formulation: With patient Time For Goal Achievement: 11/05/18 Potential to Achieve Goals: Good    Frequency Min 3X/week   Barriers to discharge        Co-evaluation               AM-PAC PT "6 Clicks" Mobility  Outcome Measure Help needed turning from your back to your side while in a flat bed without using bedrails?: None Help needed moving from lying on your back to sitting on the side of a flat bed without using bedrails?: None Help needed moving to and from a bed to a chair (including a wheelchair)?: A Little Help needed standing up from a chair using your arms (e.g., wheelchair or bedside chair)?: A Little Help needed to walk in hospital room?: A Little Help needed climbing 3-5 steps with a railing? : A Little 6 Click Score: 20    End of Session Equipment Utilized During Treatment: Gait belt Activity Tolerance: Patient tolerated treatment well Patient left: in bed;with call bell/phone within reach(Sitting  EOB) Nurse Communication: Mobility status PT Visit Diagnosis: Unsteadiness on feet (R26.81);Dizziness and giddiness (R42)    Time: 1030-1109 PT Time Calculation (min) (ACUTE ONLY): 39 min   Charges:   PT Evaluation $PT Eval Moderate Complexity: 1 Mod PT Treatments $Gait Training: 8-22 mins $Therapeutic Activity: 23-37 mins        Rolinda Roan, PT, DPT Acute Rehabilitation Services Pager: [phone] Office: [place] 10/29/2018, 2:26 PM

## 2018-10-29 NOTE — Progress Notes (Signed)
VASCULAR LAB PRELIMINARY  PRELIMINARY  PRELIMINARY  PRELIMINARY  Carotid duplex completed.    Preliminary report:  See CV proc for preliminary report  Elijah Michaelis, RVT 10/29/2018, 5:17 PM

## 2018-10-30 ENCOUNTER — Inpatient Hospital Stay (HOSPITAL_COMMUNITY): Payer: Medicare Other

## 2018-10-30 DIAGNOSIS — R0609 Other forms of dyspnea: Secondary | ICD-10-CM

## 2018-10-30 LAB — ECHOCARDIOGRAM COMPLETE
Height: 63 in
Weight: 2786.61 oz

## 2018-10-30 MED ORDER — LORAZEPAM 0.5 MG PO TABS: 0.5000 mg | ORAL_TABLET | Freq: Two times a day (BID) | ORAL | 0 refills | Status: DC | PRN

## 2018-10-30 NOTE — Discharge Summary (Signed)
Physician Discharge Summary  Paula Pollard:[id number] DOB: Sep 20, 1929 DOA: 10/28/2018  PCP: Glendale Chard, MD  Admit date: 10/28/2018 Discharge date: 10/30/2018  Admitted From: Home Disposition: Home  Recommendations for Outpatient Follow-up:  1. Follow up with PCP in 1 week with repeat CBC/BMP 2. Follow up in ED if symptoms worsen or new appear   Home Health: Home with PT Equipment/Devices: None  Discharge Condition: Stable CODE STATUS: Full Diet recommendation: Heart healthy/carb modified  Brief/Interim Summary: 85 year old female with history of hypertension, hyperlipidemia and diet-controlled diabetes mellitus presented with vertigo along with nausea and vomiting.  She was hypokalemic on presentation.  She was given IV fluids.  Her condition has improved.  No further vomiting since admission.  Vertigo is much improved.  After PT evaluation today, she will be discharged home with home health PT.  Discharge was held on 10/29/2018 because of persistent dizziness/vertigo.  MRI of the brain was negative for acute stroke.  Carotid ultrasound was negative for significant stenosis.  2D echo is pending.  She feels better today.  She will be discharged home once 2D echo is completed.   Discharge Diagnoses:  Principal Problem:   Vertigo Active Problems:   Essential hypertension, benign   Hypokalemia  Vertigo along with nausea and vomiting -Questionable cause. -Much improved since admission.  No more vomiting.   -She was supposed to be discharged on 10/29/2018 -Discharge was held on 10/29/2018 because of persistent dizziness/vertigo.  MRI of the brain was negative for acute stroke.  Carotid ultrasound was negative for significant stenosis.  2D echo is pending.  She feels better today.  She will be discharged home once 2D echo is completed.  -Can use PRN meclizine -COVID-19 testing negative on admission -Change oral Ativan to 0.5 mg twice a day as needed and follow-up with PCP  regarding the need to continue or discontinue/taper it.  Hypokalemia -Resolved with replacement.  Triamterene hydrochlorothiazide will remain on hold.  Outpatient follow-up with PCP  Hypertension -Continue Bystolic and telmisartan.  Hold triamterene hydrochlorothiazide.  -Outpatient follow-up   Discharge Instructions  Discharge Instructions    Diet - low sodium heart healthy   Complete by:  As directed    Increase activity slowly   Complete by:  As directed      Allergies as of 10/30/2018      Reactions   Codeine Nausea Only, Other (See Comments)   Reaction:Dizziness and hallucinations "makes me climb walls"      Medication List    STOP taking these medications   SALINE NASAL SPRAY NA   triamterene-hydrochlorothiazide 37.5-25 MG tablet Commonly known as:  MAXZIDE-25     TAKE these medications   aspirin EC 81 MG tablet Take 81 mg by mouth daily.   atorvastatin 20 MG tablet Commonly known as:  LIPITOR Take 20 mg by mouth every evening.   B-12 PO Take 1 tablet by mouth as needed (for energy).   Calcium 200 MG Tabs Take 200 mg by mouth daily.   cholecalciferol 10 MCG (400 UNIT) Tabs tablet Commonly known as:  VITAMIN D3 Take 1,200 Units by mouth daily.   ECHINACEA ACZ PO Take 1 capsule by mouth as needed (for immune support).   esomeprazole 20 MG capsule Commonly known as:  NEXIUM Take 20 mg by mouth as needed (for heartburn).   fexofenadine 180 MG tablet Commonly known as:  ALLEGRA Take 180 mg by mouth daily as needed for allergies.   fluticasone 50 MCG/ACT nasal spray Commonly known as:  FLONASE Place 2 sprays into both nostrils daily as needed for allergies or rhinitis. What changed:  how much to take   fluticasone furoate-vilanterol 200-25 MCG/INH Aepb Commonly known as:  Breo Ellipta Inhale 1 puff into the lungs daily.   LORazepam 0.5 MG tablet Commonly known as:  ATIVAN Take 1 tablet (0.5 mg total) by mouth every 12 (twelve) hours as needed  for anxiety. What changed:    when to take this  reasons to take this   meclizine 25 MG tablet Commonly known as:  ANTIVERT Take 1 tablet (25 mg total) by mouth 3 (three) times daily as needed for dizziness. What changed:    how much to take  when to take this  reasons to take this   meloxicam 7.5 MG tablet Commonly known as:  Mobic One tab po qd prn What changed:    how much to take  how to take this  when to take this  reasons to take this Notes to patient:  One tab by mouth every day as needed   nebivolol 5 MG tablet Commonly known as:  BYSTOLIC Take 1 tablet (5 mg total) by mouth daily.   Systane 0.4-0.3 % Soln Generic drug:  Polyethyl Glycol-Propyl Glycol Apply 1 drop to eye 2 (two) times a day.   telmisartan 20 MG tablet Commonly known as:  MICARDIS Take 20 mg by mouth daily.   triamcinolone cream 0.1 % Commonly known as:  KENALOG Apply 1 application topically 2 (two) times daily as needed.   VITAMIN C PO Take 1 tablet by mouth daily.   vitamin E 400 UNIT capsule Take 400 Units by mouth daily.      Follow-up Information    Glendale Chard, MD. Schedule an appointment as soon as possible for a visit in 1 week(s).   Specialty:  Internal Medicine Why:  With CBC/BMP Contact information: [address] [place] [address] [phone]          Allergies  Allergen Reactions  . Codeine Nausea Only and Other (See Comments)    Reaction:Dizziness and hallucinations "makes me climb walls"    Consultations:  None   Procedures/Studies: Mr Brain Wo Contrast  Result Date: 10/29/2018 CLINICAL DATA:  Dizziness.  Confusion. EXAM: MRI HEAD WITHOUT CONTRAST TECHNIQUE: Multiplanar, multiecho pulse sequences of the brain and surrounding structures were obtained without intravenous contrast. COMPARISON:  Head CT 02/10/2007 FINDINGS: Brain: There is no evidence of acute infarct, intracranial hemorrhage, mass, midline shift, or extra-axial  fluid collection. The ventricles and sulci are within normal limits for age. Patchy T2 hyperintensities in the cerebral white matter nonspecific but compatible with moderate chronic small vessel ischemic disease. 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 Vascular: Major intracranial vascular flow voids are preserved. Skull and upper cervical spine: Unremarkable bone marrow signal. Degenerative changes in the cervical spine including advanced disc degeneration at C4-5 and grade 1 anterolisthesis at C3-4. Sinuses/Orbits: Unremarkable orbits. Minimal mucosal thickening in the paranasal sinuses. Trace right mastoid fluid. Other: None. IMPRESSION: 1. No acute intracranial abnormality. 2. Moderate chronic small vessel ischemic disease. Electronically Signed   By: Logan Bores M.D.   On: 10/29/2018 13:12   Dg Chest Port 1 View  Result Date: 10/28/2018 CLINICAL DATA:  Altered level of consciousness. Vertigo/dizziness with chest pain. History of hypertension and diabetes. EXAM: PORTABLE CHEST 1 VIEW COMPARISON:  Radiographs 02/16/2015 and 12/26/2014. FINDINGS: 1006 hours. The heart size and mediastinal contours are stable. There  is mild aortic atherosclerosis. There is a lesser degree of inspiration, but no airspace disease, edema, pleural effusion or pneumothorax. No acute osseous findings are evident. Telemetry leads overlie the chest. IMPRESSION: No active cardiopulmonary process. Electronically Signed   By: Richardean Sale M.D.   On: 10/28/2018 10:16   Vas US Carotid  Result Date: 10/29/2018 Carotid Arterial Duplex Study Indications:       Vertigo. Risk Factors:      Hypertension, hyperlipidemia, Diabetes. Comparison Study:  No prior study on file Performing Technologist: Sharion Dove RVS  Examination Guidelines: A complete evaluation includes B-mode imaging, spectral Doppler, color Doppler, and power Doppler as needed of all accessible portions of each vessel. Bilateral testing is  considered an integral part of a complete examination. Limited examinations for reoccurring indications may be performed as noted.  Right Carotid Findings: +----------+--------+--------+--------+-----------+--------+           PSV cm/sEDV cm/sStenosisDescribe   Comments +----------+--------+--------+--------+-----------+--------+ CCA Prox  83      16              homogeneous         +----------+--------+--------+--------+-----------+--------+ CCA Distal94      21              homogeneous         +----------+--------+--------+--------+-----------+--------+ ICA Prox  83      26              homogeneous         +----------+--------+--------+--------+-----------+--------+ ICA Distal73      24                                  +----------+--------+--------+--------+-----------+--------+ ECA       75      9                                   +----------+--------+--------+--------+-----------+--------+ +----------+--------+-------+--------+-------------------+           PSV cm/sEDV cmsDescribeArm Pressure (mmHG) +----------+--------+-------+--------+-------------------+ Subclavian122                                        +----------+--------+-------+--------+-------------------+ +---------+--------+--+--------+--+ VertebralPSV cm/s70EDV cm/s19 +---------+--------+--+--------+--+  Left Carotid Findings: +----------+--------+--------+--------+-----------+------------------+           PSV cm/sEDV cm/sStenosisDescribe   Comments           +----------+--------+--------+--------+-----------+------------------+ CCA Prox  98      21                         intimal thickening +----------+--------+--------+--------+-----------+------------------+ CCA Distal106     24                         intimal thickening +----------+--------+--------+--------+-----------+------------------+ ICA Prox  74      21              homogeneous                    +----------+--------+--------+--------+-----------+------------------+ ICA Distal83      31                                            +----------+--------+--------+--------+-----------+------------------+  ECA       77      14                                            +----------+--------+--------+--------+-----------+------------------+ +----------+--------+--------+--------+-------------------+ SubclavianPSV cm/sEDV cm/sDescribeArm Pressure (mmHG) +----------+--------+--------+--------+-------------------+           78                                          +----------+--------+--------+--------+-------------------+ +---------+--------+--+--------+--+ VertebralPSV cm/s93EDV cm/s26 +---------+--------+--+--------+--+  Summary: Right Carotid: Velocities in the right ICA are consistent with a 1-39% stenosis. Left Carotid: Velocities in the left ICA are consistent with a 1-39% stenosis. Vertebrals:  Bilateral vertebral arteries demonstrate antegrade flow. Subclavians: Normal flow hemodynamics were seen in bilateral subclavian              arteries. *See table(s) above for measurements and observations.  Electronically signed by Deitra Mayo MD on 10/29/2018 at 5:48:35 PM.    Final       Subjective: Patient seen and examined at bedside.  She had episodes of vertigo yesterday and did not feel comfortable to go home.  This morning she feels much better.  Denies current vertigo.  No overnight fever or vomiting.  Feels that she would be okay to go home today.  Discharge Exam: Vitals:   10/30/18 0608 10/30/18 0756  BP: (!) 126/57   Pulse: 67   Resp: 15   Temp: 97.6 F (36.4 C)   SpO2: 97% 98%    General: Pt is alert, awake, not in acute distress.  Elderly female lying in bed. Cardiovascular: rate controlled, S1/S2 + Respiratory: bilateral decreased breath sounds at bases Abdominal: Soft, NT, ND, bowel sounds + Extremities: no edema, no cyanosis     The  results of significant diagnostics from this hospitalization (including imaging, microbiology, ancillary and laboratory) are listed below for reference.     Microbiology: Recent Results (from the past 240 hour(s))  SARS Coronavirus 2 (CEPHEID - Performed in [facility] hospital lab), Hosp Order     Status: None   Collection Time: 10/28/18 11:49 AM  Result Value Ref Range Status   SARS Coronavirus 2 NEGATIVE NEGATIVE Final    Comment: (NOTE) If result is NEGATIVE SARS-CoV-2 target nucleic acids are NOT DETECTED. The SARS-CoV-2 RNA is generally detectable in upper and lower  respiratory specimens during the acute phase of infection. The lowest  concentration of SARS-CoV-2 viral copies this assay can detect is 250  copies / mL. A negative result does not preclude SARS-CoV-2 infection  and should not be used as the sole basis for treatment or other  patient management decisions.  A negative result may occur with  improper specimen collection / handling, submission of specimen other  than nasopharyngeal swab, presence of viral mutation(s) within the  areas targeted by this assay, and inadequate number of viral copies  (<250 copies / mL). A negative result must be combined with clinical  observations, patient history, and epidemiological information. If result is POSITIVE SARS-CoV-2 target nucleic acids are DETECTED. The SARS-CoV-2 RNA is generally detectable in upper and lower  respiratory specimens dur ing the acute phase of infection.  Positive  results are indicative of active infection with SARS-CoV-2.  Clinical  correlation with patient history  and other diagnostic information is  necessary to determine patient infection status.  Positive results do  not rule out bacterial infection or co-infection with other viruses. If result is PRESUMPTIVE POSTIVE SARS-CoV-2 nucleic acids MAY BE PRESENT.   A presumptive positive result was obtained on the submitted specimen  and confirmed on  repeat testing.  While 2019 novel coronavirus  (SARS-CoV-2) nucleic acids may be present in the submitted sample  additional confirmatory testing may be necessary for epidemiological  and / or clinical management purposes  to differentiate between  SARS-CoV-2 and other Sarbecovirus currently known to infect humans.  If clinically indicated additional testing with an alternate test  methodology [phone]) is advised. The SARS-CoV-2 RNA is generally  detectable in upper and lower respiratory sp ecimens during the acute  phase of infection. The expected result is Negative. Fact Sheet for Patients:  StrictlyIdeas.no Fact Sheet for Healthcare Providers: BankingDealers.co.za This test is not yet approved or cleared by the [place] FDA and has been authorized for detection and/or diagnosis of SARS-CoV-2 by FDA under an Emergency Use Authorization (EUA).  This EUA will remain in effect (meaning this test can be used) for the duration of the COVID-19 declaration under Section 564(b)(1) of the Act, 21 U.S.C. section 360bbb-3(b)(1), unless the authorization is terminated or revoked sooner. Performed at [facility], [place] [address], [place], [place] [address]   Urine culture     Status: Abnormal   Collection Time: 10/28/18 11:55 AM  Result Value Ref Range Status   Specimen Description URINE, RANDOM  Final   Special Requests unknown Normal  Final   Culture (A)  Final    <10,000 COLONIES/mL INSIGNIFICANT GROWTH Performed at [facility] [address] [place], [place] [address]    Report Status 10/29/2018 FINAL  Final     Labs: BNP (last 3 results) No results for input(s): BNP in the last 8760 hours. Basic Metabolic Panel: Recent Labs  Lab 10/28/18 1005 10/29/18 0325  NA 140 137  K 2.5* 4.3  CL 114* 108  CO2 20* 24  GLUCOSE 94 98  BUN 12 9  CREATININE 0.53 0.73  CALCIUM 6.6* 8.6*  MG 1.5*  --    Liver Function  Tests: Recent Labs  Lab 10/28/18 1005  AST 16  ALT 12  ALKPHOS 48  BILITOT 0.3  PROT 4.9*  ALBUMIN 2.5*   No results for input(s): LIPASE, AMYLASE in the last 168 hours. No results for input(s): AMMONIA in the last 168 hours. CBC: Recent Labs  Lab 10/28/18 1005 10/29/18 0325  WBC 4.3 5.2  NEUTROABS 3.0  --   HGB 10.5* 11.0*  HCT 33.5* 34.6*  MCV 92.5 91.5  PLT 166 193   Cardiac Enzymes: No results for input(s): CKTOTAL, CKMB, CKMBINDEX, TROPONINI in the last 168 hours. BNP: Invalid input(s): POCBNP CBG: Recent Labs  Lab 10/28/18 1004  GLUCAP 91   D-Dimer No results for input(s): DDIMER in the last 72 hours. Hgb A1c No results for input(s): HGBA1C in the last 72 hours. Lipid Profile No results for input(s): CHOL, HDL, LDLCALC, TRIG, CHOLHDL, LDLDIRECT in the last 72 hours. Thyroid function studies No results for input(s): TSH, T4TOTAL, T3FREE, THYROIDAB in the last 72 hours.  Invalid input(s): FREET3 Anemia work up No results for input(s): VITAMINB12, FOLATE, FERRITIN, TIBC, IRON, RETICCTPCT in the last 72 hours. Urinalysis    Component Value Date/Time   COLORURINE YELLOW 10/28/2018 [place] 10/28/2018 1155  LABSPEC 1.017 10/28/2018 1155   PHURINE 7.0 10/28/2018 1155   GLUCOSEU NEGATIVE 10/28/2018 [place] 10/28/2018 [place] 10/28/2018 [place] 10/28/2018 1155   PROTEINUR NEGATIVE 10/28/2018 1155   UROBILINOGEN 0.2 08/08/2012 0920   NITRITE NEGATIVE 10/28/2018 1155   LEUKOCYTESUR NEGATIVE 10/28/2018 1155   Sepsis Labs Invalid input(s): PROCALCITONIN,  WBC,  LACTICIDVEN Microbiology Recent Results (from the past 240 hour(s))  SARS Coronavirus 2 (CEPHEID - Performed in [place] [facility] hospital lab), Hosp Order     Status: None   Collection Time: 10/28/18 11:49 AM  Result Value Ref Range Status   SARS Coronavirus 2 NEGATIVE NEGATIVE Final    Comment: (NOTE) If result is  NEGATIVE SARS-CoV-2 target nucleic acids are NOT DETECTED. The SARS-CoV-2 RNA is generally detectable in upper and lower  respiratory specimens during the acute phase of infection. The lowest  concentration of SARS-CoV-2 viral copies this assay can detect is 250  copies / mL. A negative result does not preclude SARS-CoV-2 infection  and should not be used as the sole basis for treatment or other  patient management decisions.  A negative result may occur with  improper specimen collection / handling, submission of specimen other  than nasopharyngeal swab, presence of viral mutation(s) within the  areas targeted by this assay, and inadequate number of viral copies  (<250 copies / mL). A negative result must be combined with clinical  observations, patient history, and epidemiological information. If result is POSITIVE SARS-CoV-2 target nucleic acids are DETECTED. The SARS-CoV-2 RNA is generally detectable in upper and lower  respiratory specimens dur ing the acute phase of infection.  Positive  results are indicative of active infection with SARS-CoV-2.  Clinical  correlation with patient history and other diagnostic information is  necessary to determine patient infection status.  Positive results do  not rule out bacterial infection or co-infection with other viruses. If result is PRESUMPTIVE POSTIVE SARS-CoV-2 nucleic acids MAY BE PRESENT.   A presumptive positive result was obtained on the submitted specimen  and confirmed on repeat testing.  While 2019 novel coronavirus  (SARS-CoV-2) nucleic acids may be present in the submitted sample  additional confirmatory testing may be necessary for epidemiological  and / or clinical management purposes  to differentiate between  SARS-CoV-2 and other Sarbecovirus currently known to infect humans.  If clinically indicated additional testing with an alternate test  methodology [phone]) is advised. The SARS-CoV-2 RNA is generally  detectable  in upper and lower respiratory sp ecimens during the acute  phase of infection. The expected result is Negative. Fact Sheet for Patients:  StrictlyIdeas.no Fact Sheet for Healthcare Providers: BankingDealers.co.za This test is not yet approved or cleared by the [place] FDA and has been authorized for detection and/or diagnosis of SARS-CoV-2 by FDA under an Emergency Use Authorization (EUA).  This EUA will remain in effect (meaning this test can be used) for the duration of the COVID-19 declaration under Section 564(b)(1) of the Act, 21 U.S.C. section 360bbb-3(b)(1), unless the authorization is terminated or revoked sooner. Performed at [facility], [place] [address], [place], [place] [address]   Urine culture     Status: Abnormal   Collection Time: 10/28/18 11:55 AM  Result Value Ref Range Status   Specimen Description URINE, RANDOM  Final   Special Requests unknown Normal  Final   Culture (A)  Final    <10,000 COLONIES/mL INSIGNIFICANT GROWTH Performed at [facility], [address], [place], [place] [address]    Report Status 10/29/2018 FINAL  Final     Time coordinating discharge: 35 minutes  SIGNED:   Aline August, MD  [facility] 10/30/2018, 8:53 AM

## 2018-10-30 NOTE — Progress Notes (Signed)
Pt discharged to home. I removed her IV, discussed her AVS and discharge instructions with her, including follow up. Questions answered. She left the unit in stable condition.

## 2018-10-30 NOTE — Progress Notes (Signed)
  Echocardiogram 2D Echocardiogram has been performed.  Paula Pollard Paula Pollard 10/30/2018, 9:06 AM

## 2018-10-30 NOTE — Plan of Care (Signed)
  Problem: Clinical Measurements: Goal: Ability to maintain clinical measurements within normal limits will improve Outcome: Progressing   Problem: Safety: Goal: Ability to remain free from injury will improve Outcome: Progressing   

## 2018-10-30 NOTE — Progress Notes (Signed)
Pt resting in bed, feeling good. Reports no incidents of dizziness or vertigo. Awaiting echocardiogram. Pt feeling good about going home. Will monitor.

## 2018-11-01 ENCOUNTER — Telehealth: Payer: Self-pay

## 2018-11-01 NOTE — Telephone Encounter (Signed)
Transition Care Management Follow-up Telephone Call  Date of discharge and from where: 10/30/2018  [facility]   How have you been since you were released from the hospital? The patient said she has been lightheaded since her appt. But she feels better.  Any questions or concerns? No  Items Reviewed:  Did the pt receive and understand the discharge instructions provided? Yes  Medications obtained and verified? Yes  Any new allergies since your discharge? No  Dietary orders reviewed? Yes, no salt.  Do you have support at home? Yes  Other (ie: DME, Home Health, etc) No  Functional Questionnaire: (I = Independent and D = Dependent) ADL's: I  Bathing/Dressing- I   Meal Prep- I  Eating- D  Maintaining continence- I  Transferring/Ambulation- I  Managing Meds- I   Follow up appointments reviewed:    PCP Hospital f/u appt confirmed? The pt has to be called for an appointment because she doesn't want a virtual or   [facility] f/u appt confirmed? N/A  Are transportation arrangements needed? No  If their condition worsens, is the pt aware to call  their PCP or go to the ED?Yes  Was the patient provided with contact information for the PCP's office or ED? Yes  Was the pt encouraged to call back with questions or concerns?Yes

## 2018-11-08 ENCOUNTER — Telehealth: Payer: Self-pay | Admitting: Internal Medicine

## 2018-11-08 ENCOUNTER — Telehealth: Payer: Self-pay

## 2018-11-08 DIAGNOSIS — Z20822 Contact with and (suspected) exposure to covid-19: Secondary | ICD-10-CM

## 2018-11-08 NOTE — Telephone Encounter (Signed)
Pt called back; the pt says that she was tested when she was hospitalized on 10/28/2018; she declines testing at this time; pt instructed to call back if she later decides that she would like to be tested; she verbalized understanding.

## 2018-11-08 NOTE — Telephone Encounter (Signed)
Pt and daughter returned call and the patient has decided to be tested for covid-19. She is scheduled for Wednesday morning at 8 am at the [facility]. She is advised to stay in her car, mask on with windows rolled up until ready for testing. Pt voiced understanding.

## 2018-11-08 NOTE — Telephone Encounter (Signed)
Called placed to both pt. @ [phone], and to  daughter Jeannene Patella @ [phone].  Left voice message that testing for COVID 19 is being offered to the pt., due to potential exposure, when pt. was recently hospitalized.  Advised to call [phone] between 7:00 AM -7:00 PM, to discuss further, and be scheduled for testing.

## 2018-11-09 ENCOUNTER — Telehealth: Payer: Self-pay

## 2018-11-09 ENCOUNTER — Other Ambulatory Visit: Payer: Medicare Other

## 2018-11-09 ENCOUNTER — Other Ambulatory Visit: Payer: Self-pay | Admitting: Internal Medicine

## 2018-11-09 NOTE — Telephone Encounter (Signed)
Patient had an appointment for COVID testing at [facility] at 8 AM on 11/09/18.  There was an IT issue that caused a backup in processing the patients. She said she could not stay and would reschedule her appointment. Otho Najjar was apprised of the situation.

## 2018-11-09 NOTE — Telephone Encounter (Signed)
Pt called to reschedule her COVID-19 test. She states that the computer was down this AM and her ride could not wait.  She had questions about testing family that she has been in contact with after discharge from the hospital. Pt has no symptoms. She was assured that if her test is positive her family would be tested according to her doctors advice.

## 2018-11-10 ENCOUNTER — Other Ambulatory Visit: Payer: Medicare Other

## 2018-11-10 ENCOUNTER — Other Ambulatory Visit: Payer: Self-pay | Admitting: Internal Medicine

## 2018-11-12 LAB — NOVEL CORONAVIRUS, NAA: SARS-CoV-2, NAA: NOT DETECTED

## 2018-11-14 ENCOUNTER — Other Ambulatory Visit: Payer: Self-pay

## 2018-11-14 ENCOUNTER — Telehealth: Payer: Self-pay

## 2018-11-14 ENCOUNTER — Inpatient Hospital Stay: Payer: Self-pay | Admitting: Internal Medicine

## 2018-11-14 MED ORDER — TRIAMTERENE-HCTZ 37.5-25 MG PO TABS: ORAL_TABLET | ORAL | 3 refills | Status: DC

## 2018-11-14 NOTE — Telephone Encounter (Signed)
Pt called stating that she was having a headache since being off of her medication. Spoke with provider and she instructed pt to take half of her maxide. Pt encouraged to call the office for any concerns.

## 2018-11-17 ENCOUNTER — Inpatient Hospital Stay: Payer: Self-pay | Admitting: Internal Medicine

## 2018-11-21 ENCOUNTER — Other Ambulatory Visit: Payer: Self-pay

## 2018-11-21 ENCOUNTER — Ambulatory Visit (INDEPENDENT_AMBULATORY_CARE_PROVIDER_SITE_OTHER): Payer: Medicare Other | Admitting: Internal Medicine

## 2018-11-21 ENCOUNTER — Encounter: Payer: Self-pay | Admitting: Internal Medicine

## 2018-11-21 VITALS — BP 146/88 | HR 74 | Temp 97.3°F | Ht 61.4 in | Wt 169.8 lb

## 2018-11-21 DIAGNOSIS — Z09 Encounter for follow-up examination after completed treatment for conditions other than malignant neoplasm: Secondary | ICD-10-CM | POA: Diagnosis not present

## 2018-11-21 DIAGNOSIS — F411 Generalized anxiety disorder: Secondary | ICD-10-CM | POA: Diagnosis not present

## 2018-11-21 DIAGNOSIS — R42 Dizziness and giddiness: Secondary | ICD-10-CM

## 2018-11-21 DIAGNOSIS — N182 Chronic kidney disease, stage 2 (mild): Secondary | ICD-10-CM | POA: Diagnosis not present

## 2018-11-21 DIAGNOSIS — E876 Hypokalemia: Secondary | ICD-10-CM

## 2018-11-21 DIAGNOSIS — I129 Hypertensive chronic kidney disease with stage 1 through stage 4 chronic kidney disease, or unspecified chronic kidney disease: Secondary | ICD-10-CM | POA: Diagnosis not present

## 2018-11-21 MED ORDER — SERTRALINE HCL 25 MG PO TABS: 25.0000 mg | ORAL_TABLET | Freq: Every day | ORAL | 1 refills | Status: DC

## 2018-11-21 NOTE — Progress Notes (Signed)
Subjective:     Patient ID: Paula Pollard , female    DOB: 10/22/1929 , 85 y.o.   MRN: [id number]   Chief Complaint  Patient presents with  . Hospitalization Follow-up    HPI  Date of Admission: 10/28/2018 Date of Discharge: 10/30/2018  She presents for hospital f/u today.  She is an 85 year old female with history of hypertension, hyperlipidemia and diet-controlled diabetes mellitus presented with vertigo along with nausea and vomiting.  She was hypokalemic on presentation.  She was given IV fluids which resulted in improvement in her condition.  She was initially felt to be stable for discharge on 5/23; however, this was held due to persistent dizziness.  Additional studies were performed:  MRI of the brain was negative for acute stroke.  Carotid ultrasound was negative for significant stenosis.    She has not had any issues since discharge. She has had no further episodes of vertigo AND n/v. She would like to contact her daughter by phone later in the visit to discuss her concerns.      Past Medical History:  Diagnosis Date  . Anemia    "in past as a young girl"  . Anxiety   . Arthritis   . Asthma   . Bronchitis    hx of  . Cataract    bilaterally, "no surgery at this time"  . Diabetes mellitus without complication ([place])    pre diabetic  . Dysrhythmia    when gets excited  . GERD (gastroesophageal reflux disease)   . Headache(784.0)    hx of migraines  . History of hiatal hernia   . Hyperlipidemia   . Hypertension    sees Dr. Terrence Dupont  . Neuromuscular disorder (HCC)    hx of carpal tunnel "never had surgery for"right hand     Family History  Problem Relation Age of Onset  . Heart attack Mother   . Heart attack Father   . Stroke Father   . Stroke Daughter   . Colon cancer Sister      Current Outpatient Medications:  .  Ascorbic Acid (VITAMIN C PO), Take 1 tablet by mouth daily. , Disp: , Rfl:  .  aspirin EC 81 MG tablet, Take 81 mg by mouth daily., Disp: ,  Rfl:  .  Calcium 200 MG TABS, Take 200 mg by mouth daily., Disp: , Rfl:  .  cholecalciferol (VITAMIN D) 400 units TABS tablet, Take 1,200 Units by mouth daily., Disp: , Rfl:  .  Cyanocobalamin (B-12 PO), Take 1 tablet by mouth as needed (for energy)., Disp: , Rfl:  .  esomeprazole (NEXIUM) 20 MG capsule, Take 20 mg by mouth as needed (for heartburn)., Disp: , Rfl:  .  fexofenadine (ALLEGRA) 180 MG tablet, Take 180 mg by mouth daily as needed for allergies. , Disp: , Rfl:  .  fluticasone (FLONASE) 50 MCG/ACT nasal spray, Place 2 sprays into both nostrils daily as needed for allergies or rhinitis. (Patient taking differently: Place 1 spray into both nostrils daily as needed for allergies or rhinitis. ), Disp: 18.2 g, Rfl: 5 .  fluticasone furoate-vilanterol (BREO ELLIPTA) 200-25 MCG/INH AEPB, Inhale 1 puff into the lungs daily., Disp: 1 each, Rfl: 5 .  LORazepam (ATIVAN) 0.5 MG tablet, Take 1 tablet (0.5 mg total) by mouth every 12 (twelve) hours as needed for anxiety., Disp: 30 tablet, Rfl: 0 .  meclizine (ANTIVERT) 25 MG tablet, Take 1 tablet (25 mg total) by mouth 3 (three) times daily as needed  for dizziness., Disp: 30 tablet, Rfl: 0 .  Multiple Vitamins-Minerals (ECHINACEA ACZ PO), Take 1 capsule by mouth as needed (for immune support)., Disp: , Rfl:  .  nebivolol (BYSTOLIC) 5 MG tablet, Take 1 tablet (5 mg total) by mouth daily., Disp: 30 tablet, Rfl: 0 .  Polyethyl Glycol-Propyl Glycol (SYSTANE) 0.4-0.3 % SOLN, Apply 1 drop to eye 2 (two) times a day. , Disp: , Rfl:  .  telmisartan (MICARDIS) 20 MG tablet, TAKE 1 TABLET BY MOUTH EVERY DAY, Disp: 90 tablet, Rfl: 1 .  triamcinolone cream (KENALOG) 0.1 %, Apply 1 application topically 2 (two) times daily as needed., Disp: 30 g, Rfl: 1 .  triamterene-hydrochlorothiazide (MAXZIDE-25) 37.5-25 MG tablet, Take 1/2 tablet by mouth daily, Disp: 15 tablet, Rfl: 3 .  vitamin E 400 UNIT capsule, Take 400 Units by mouth daily., Disp: , Rfl:  .  sertraline  (ZOLOFT) 25 MG tablet, Take 1 tablet (25 mg total) by mouth daily., Disp: 30 tablet, Rfl: 1   Allergies  Allergen Reactions  . Codeine Nausea Only and Other (See Comments)    Reaction:Dizziness and hallucinations "makes me climb walls"     Review of Systems  Constitutional: Negative.   Respiratory: Negative.   Cardiovascular: Negative.   Gastrointestinal: Negative.   Neurological: Negative.   Psychiatric/Behavioral: The patient is nervous/anxious.      Today's Vitals   11/21/18 1459  BP: (!) 146/88  Pulse: 74  Temp: (!) 97.3 F (36.3 C)  TempSrc: Oral  Weight: 169 lb 12.8 oz (77 kg)  Height: 5' 1.4" (1.56 m)  PainSc: 0-No pain   Body mass index is 31.67 kg/m.   Objective:  Physical Exam Vitals signs and nursing note reviewed.  Constitutional:      Appearance: Normal appearance.  HENT:     Head: Normocephalic and atraumatic.  Cardiovascular:     Rate and Rhythm: Normal rate and regular rhythm.     Heart sounds: Normal heart sounds.  Pulmonary:     Effort: Pulmonary effort is normal.     Breath sounds: Normal breath sounds.  Skin:    General: Skin is warm.  Neurological:     General: No focal deficit present.     Mental Status: She is alert.  Psychiatric:        Mood and Affect: Mood normal.        Behavior: Behavior normal.         Assessment And Plan:     1. Vertigo  TCM PERFORMED. A MEMBER OF THE CLINICAL TEAM SPOKE WITH THE PATIENT UPON DISCHARGE. DISCHARGE SUMMARY WAS REVIEWED IN FULL DETAIL DURING THE VISIT. MEDS RECONCILED AND COMPARED TO DISCHARGE MEDS. MEDICATION LIST WAS UPDATED AND REVIEWED WITH THE PATIENT. GREATER THAN 50% FACE TO FACE TIME WAS SPENT IN COUNSELING AND COORDINATION OF CARE. SHE IS ENCOURAGED TO STAY WELL HYDRATED AND TO TAKE MEDS AS DIRECTED.  ALL QUESTIONS WERE ANSWERED TO THE SATISFACTION OF THE PATIENT.    2. Hypertensive nephropathy  Fair control. She will continue with current meds. She is encouraged to avoid adding salt  to her foods.   - CBC no Diff  3. Chronic renal disease, stage II  Chronic. I will check GFR, Cr today.   4. Hypokalemia  I will recheck potassium levels today. She is encouraged to increase intake of potassium rich foods.   - CMP14+EGFR  5. Generalized anxiety disorder  I spoke with her daughter Cindi by phone during the visit. She expresses concern  that pt has been on alprazolam for anxiety (prescribed by previous physician). In the hospital, she was switched to ativan. She would like her Mom to be weaned off of this medication. We both agree that she may benefit from an SSRI. I will start zoloft, 31m daily, advised to take with evening meals. She will rto in four weeks for re-evaluation. Possible side effects were discussed with the patient. She will c/w ativan bid prn for now. Eventually, we will cut back to once daily and then to PRN use only. Pt's daughter reminded that her Mom has been on the medication for a long period of time; therefore, cannot be taken off of abruptly. Everyone is in agreement with her treatment plan. All questions were answered to everyone's satisfaction.   RMaximino Greenland MD    THE PATIENT IS ENCOURAGED TO PRACTICE SOCIAL DISTANCING DUE TO THE COVID-19 PANDEMIC.

## 2018-11-22 LAB — CBC
Hematocrit: 41.2 % (ref 34.0–46.6)
Hemoglobin: 13.4 g/dL (ref 11.1–15.9)
MCH: 29.2 pg (ref 26.6–33.0)
MCHC: 32.5 g/dL (ref 31.5–35.7)
MCV: 90 fL (ref 79–97)
Platelets: 244 10*3/uL (ref 150–450)
RBC: 4.59 x10E6/uL (ref 3.77–5.28)
RDW: 12.5 % (ref 11.7–15.4)
WBC: 5.1 10*3/uL (ref 3.4–10.8)

## 2018-11-22 LAB — CMP14+EGFR
ALT: 16 IU/L (ref 0–32)
AST: 22 IU/L (ref 0–40)
Albumin/Globulin Ratio: 1.5 (ref 1.2–2.2)
Albumin: 4.5 g/dL (ref 3.6–4.6)
Alkaline Phosphatase: 88 IU/L (ref 39–117)
BUN/Creatinine Ratio: 14 (ref 12–28)
BUN: 13 mg/dL (ref 8–27)
Bilirubin Total: 0.3 mg/dL (ref 0.0–1.2)
CO2: 27 mmol/L (ref 20–29)
Calcium: 10.4 mg/dL — ABNORMAL HIGH (ref 8.7–10.3)
Chloride: 98 mmol/L (ref 96–106)
Creatinine, Ser: 0.94 mg/dL (ref 0.57–1.00)
GFR calc Af Amer: 64 mL/min/{1.73_m2} (ref >59)
GFR calc non Af Amer: 55 mL/min/{1.73_m2} — ABNORMAL LOW (ref >59)
Globulin, Total: 3 g/dL (ref 1.5–4.5)
Glucose: 101 mg/dL — ABNORMAL HIGH (ref 65–99)
Potassium: 4 mmol/L (ref 3.5–5.2)
Sodium: 138 mmol/L (ref 134–144)
Total Protein: 7.5 g/dL (ref 6.0–8.5)

## 2018-12-19 ENCOUNTER — Encounter: Payer: Self-pay | Admitting: Internal Medicine

## 2018-12-22 ENCOUNTER — Ambulatory Visit: Payer: Medicare Other | Admitting: Internal Medicine

## 2018-12-28 ENCOUNTER — Ambulatory Visit (INDEPENDENT_AMBULATORY_CARE_PROVIDER_SITE_OTHER): Payer: Medicare Other | Admitting: Internal Medicine

## 2018-12-28 ENCOUNTER — Other Ambulatory Visit: Payer: Self-pay

## 2018-12-28 ENCOUNTER — Encounter: Payer: Self-pay | Admitting: Internal Medicine

## 2018-12-28 VITALS — BP 146/72 | HR 77 | Temp 98.5°F | Ht 61.4 in | Wt 174.0 lb

## 2018-12-28 DIAGNOSIS — F411 Generalized anxiety disorder: Secondary | ICD-10-CM | POA: Diagnosis not present

## 2018-12-28 DIAGNOSIS — I10 Essential (primary) hypertension: Secondary | ICD-10-CM

## 2018-12-28 DIAGNOSIS — R350 Frequency of micturition: Secondary | ICD-10-CM

## 2018-12-28 DIAGNOSIS — R202 Paresthesia of skin: Secondary | ICD-10-CM

## 2018-12-28 DIAGNOSIS — Z79899 Other long term (current) drug therapy: Secondary | ICD-10-CM | POA: Diagnosis not present

## 2018-12-28 LAB — POCT URINALYSIS DIPSTICK
Bilirubin, UA: NEGATIVE
Blood, UA: NEGATIVE
Glucose, UA: NEGATIVE
Ketones, UA: NEGATIVE
Leukocytes, UA: NEGATIVE
Nitrite, UA: NEGATIVE
Protein, UA: NEGATIVE
Spec Grav, UA: 1.02 (ref 1.010–1.025)
Urobilinogen, UA: 0.2 E.U./dL
pH, UA: 8.5 — AB (ref 5.0–8.0)

## 2018-12-28 NOTE — Patient Instructions (Signed)

## 2018-12-29 LAB — TSH: TSH: 2.46 u[IU]/mL (ref 0.450–4.500)

## 2018-12-29 LAB — VITAMIN B12: Vitamin B-12: 1616 pg/mL — ABNORMAL HIGH (ref 232–1245)

## 2018-12-31 NOTE — Progress Notes (Signed)
Subjective:     Patient ID: Paula Pollard , female    DOB: 17-Aug-1929 , 85 y.o.   MRN: [id number]   Chief Complaint  Patient presents with  . sertraline f/u  . burning under feet    HPI  She is here today for f/u anxiety. She was started on sertraline at her last visit. She reports "she didn't feel right" while on this medication. She went back to using alprazolam as needed. She realizes her daughter does not want her on this medication. However, she does not wish to take any other medication.   Additionally, she c/o burning sensation under her feet. She is unable to state what triggers her sx. She denies having any balance issues. She does not have h/o diabetes. Her sx started about a month ago. She denies LE weakness.     Past Medical History:  Diagnosis Date  . Anemia    "in past as a young girl"  . Anxiety   . Arthritis   . Asthma   . Bronchitis    hx of  . Cataract    bilaterally, "no surgery at this time"  . Diabetes mellitus without complication ([place])    pre diabetic  . Dysrhythmia    when gets excited  . GERD (gastroesophageal reflux disease)   . Headache(784.0)    hx of migraines  . History of hiatal hernia   . Hyperlipidemia   . Hypertension    sees Dr. Terrence Dupont  . Neuromuscular disorder (HCC)    hx of carpal tunnel "never had surgery for"right hand     Family History  Problem Relation Age of Onset  . Heart attack Mother   . Heart attack Father   . Stroke Father   . Stroke Daughter   . Colon cancer Sister      Current Outpatient Medications:  .  Ascorbic Acid (VITAMIN C PO), Take 1 tablet by mouth daily. , Disp: , Rfl:  .  aspirin EC 81 MG tablet, Take 81 mg by mouth daily., Disp: , Rfl:  .  Calcium 200 MG TABS, Take 200 mg by mouth daily., Disp: , Rfl:  .  cholecalciferol (VITAMIN D) 400 units TABS tablet, Take 1,200 Units by mouth daily., Disp: , Rfl:  .  Cyanocobalamin (B-12 PO), Take 1 tablet by mouth as needed (for energy)., Disp: , Rfl:   .  esomeprazole (NEXIUM) 20 MG capsule, Take 20 mg by mouth as needed (for heartburn)., Disp: , Rfl:  .  fexofenadine (ALLEGRA) 180 MG tablet, Take 180 mg by mouth daily as needed for allergies. , Disp: , Rfl:  .  fluticasone (FLONASE) 50 MCG/ACT nasal spray, Place 2 sprays into both nostrils daily as needed for allergies or rhinitis. (Patient taking differently: Place 1 spray into both nostrils daily as needed for allergies or rhinitis. ), Disp: 18.2 g, Rfl: 5 .  fluticasone furoate-vilanterol (BREO ELLIPTA) 200-25 MCG/INH AEPB, Inhale 1 puff into the lungs daily., Disp: 1 each, Rfl: 5 .  LORazepam (ATIVAN) 0.5 MG tablet, Take 1 tablet (0.5 mg total) by mouth every 12 (twelve) hours as needed for anxiety., Disp: 30 tablet, Rfl: 0 .  meclizine (ANTIVERT) 25 MG tablet, Take 1 tablet (25 mg total) by mouth 3 (three) times daily as needed for dizziness., Disp: 30 tablet, Rfl: 0 .  Multiple Vitamins-Minerals (ECHINACEA ACZ PO), Take 1 capsule by mouth as needed (for immune support)., Disp: , Rfl:  .  nebivolol (BYSTOLIC) 5 MG tablet, Take 1  tablet (5 mg total) by mouth daily., Disp: 30 tablet, Rfl: 0 .  Polyethyl Glycol-Propyl Glycol (SYSTANE) 0.4-0.3 % SOLN, Apply 1 drop to eye 2 (two) times a day. , Disp: , Rfl:  .  telmisartan (MICARDIS) 20 MG tablet, TAKE 1 TABLET BY MOUTH EVERY DAY, Disp: 90 tablet, Rfl: 1 .  triamcinolone cream (KENALOG) 0.1 %, Apply 1 application topically 2 (two) times daily as needed., Disp: 30 g, Rfl: 1 .  triamterene-hydrochlorothiazide (MAXZIDE-25) 37.5-25 MG tablet, Take 1/2 tablet by mouth daily, Disp: 15 tablet, Rfl: 3 .  vitamin E 400 UNIT capsule, Take 400 Units by mouth daily., Disp: , Rfl:  .  sertraline (ZOLOFT) 25 MG tablet, Take 1 tablet (25 mg total) by mouth daily. (Patient not taking: Reported on 12/28/2018), Disp: 30 tablet, Rfl: 1   Allergies  Allergen Reactions  . Codeine Nausea Only and Other (See Comments)    Reaction:Dizziness and hallucinations "makes  me climb walls"     Review of Systems  Constitutional: Negative.   Respiratory: Negative.   Cardiovascular: Negative.   Gastrointestinal: Negative.   Genitourinary: Positive for frequency.  Neurological: Negative.   Psychiatric/Behavioral: Negative.      Today's Vitals   12/28/18 1150  BP: (!) 146/72  Pulse: 77  Temp: 98.5 F (36.9 C)  TempSrc: Oral  Weight: 174 lb (78.9 kg)  Height: 5' 1.4" (1.56 m)   Body mass index is 32.45 kg/m.   Objective:  Physical Exam Vitals signs and nursing note reviewed.  Constitutional:      Appearance: Normal appearance.  HENT:     Head: Normocephalic and atraumatic.  Cardiovascular:     Rate and Rhythm: Normal rate and regular rhythm.     Heart sounds: Normal heart sounds.  Pulmonary:     Effort: Pulmonary effort is normal.     Breath sounds: Normal breath sounds.  Skin:    General: Skin is warm.  Neurological:     General: No focal deficit present.     Mental Status: She is alert.  Psychiatric:        Mood and Affect: Mood normal.        Behavior: Behavior normal.         Assessment And Plan:     1. Generalized anxiety disorder  Chronic. She will continue with alprazolam prn.  2. Paresthesia of foot, bilateral  I will check vitamin B12 level and TSH.   - TSH  3. Urinary frequency  I will check u/a today to r/o UTI. She is advised to stop drinking 2 hours prior to going to bed.   - POCT Urinalysis Dipstick (81002)  4. Drug therapy  - Vitamin B12        [name], MD    THE PATIENT IS ENCOURAGED TO PRACTICE SOCIAL DISTANCING DUE TO THE COVID-19 PANDEMIC.

## 2019-01-06 ENCOUNTER — Other Ambulatory Visit: Payer: Self-pay | Admitting: Internal Medicine

## 2019-01-09 DIAGNOSIS — I2510 Atherosclerotic heart disease of native coronary artery without angina pectoris: Secondary | ICD-10-CM | POA: Diagnosis not present

## 2019-01-09 DIAGNOSIS — E559 Vitamin D deficiency, unspecified: Secondary | ICD-10-CM | POA: Diagnosis not present

## 2019-01-09 DIAGNOSIS — Z8673 Personal history of transient ischemic attack (TIA), and cerebral infarction without residual deficits: Secondary | ICD-10-CM | POA: Diagnosis not present

## 2019-01-09 DIAGNOSIS — I10 Essential (primary) hypertension: Secondary | ICD-10-CM | POA: Diagnosis not present

## 2019-01-09 DIAGNOSIS — I340 Nonrheumatic mitral (valve) insufficiency: Secondary | ICD-10-CM | POA: Diagnosis not present

## 2019-01-09 DIAGNOSIS — E785 Hyperlipidemia, unspecified: Secondary | ICD-10-CM | POA: Diagnosis not present

## 2019-01-09 DIAGNOSIS — M1990 Unspecified osteoarthritis, unspecified site: Secondary | ICD-10-CM | POA: Diagnosis not present

## 2019-01-09 DIAGNOSIS — J45909 Unspecified asthma, uncomplicated: Secondary | ICD-10-CM | POA: Diagnosis not present

## 2019-01-13 ENCOUNTER — Other Ambulatory Visit: Payer: Self-pay | Admitting: Internal Medicine

## 2019-02-07 ENCOUNTER — Other Ambulatory Visit: Payer: Self-pay | Admitting: Internal Medicine

## 2019-02-10 DIAGNOSIS — I6381 Other cerebral infarction due to occlusion or stenosis of small artery: Secondary | ICD-10-CM | POA: Diagnosis not present

## 2019-02-10 DIAGNOSIS — I340 Nonrheumatic mitral (valve) insufficiency: Secondary | ICD-10-CM | POA: Diagnosis not present

## 2019-02-10 DIAGNOSIS — E785 Hyperlipidemia, unspecified: Secondary | ICD-10-CM | POA: Diagnosis not present

## 2019-02-10 DIAGNOSIS — I2510 Atherosclerotic heart disease of native coronary artery without angina pectoris: Secondary | ICD-10-CM | POA: Diagnosis not present

## 2019-02-10 DIAGNOSIS — J45909 Unspecified asthma, uncomplicated: Secondary | ICD-10-CM | POA: Diagnosis not present

## 2019-02-10 DIAGNOSIS — I10 Essential (primary) hypertension: Secondary | ICD-10-CM | POA: Diagnosis not present

## 2019-02-10 DIAGNOSIS — E559 Vitamin D deficiency, unspecified: Secondary | ICD-10-CM | POA: Diagnosis not present

## 2019-02-10 DIAGNOSIS — M1990 Unspecified osteoarthritis, unspecified site: Secondary | ICD-10-CM | POA: Diagnosis not present

## 2019-03-02 ENCOUNTER — Other Ambulatory Visit: Payer: Self-pay

## 2019-03-02 MED ORDER — BREO ELLIPTA 200-25 MCG/INH IN AEPB: 1.0000 | INHALATION_SPRAY | Freq: Every day | RESPIRATORY_TRACT | 5 refills | Status: DC

## 2019-03-06 ENCOUNTER — Ambulatory Visit: Payer: Medicare Other | Admitting: Internal Medicine

## 2019-03-22 DIAGNOSIS — H8111 Benign paroxysmal vertigo, right ear: Secondary | ICD-10-CM | POA: Diagnosis not present

## 2019-03-22 DIAGNOSIS — H6121 Impacted cerumen, right ear: Secondary | ICD-10-CM | POA: Diagnosis not present

## 2019-03-22 DIAGNOSIS — J343 Hypertrophy of nasal turbinates: Secondary | ICD-10-CM | POA: Diagnosis not present

## 2019-03-22 DIAGNOSIS — H9042 Sensorineural hearing loss, unilateral, left ear, with unrestricted hearing on the contralateral side: Secondary | ICD-10-CM | POA: Diagnosis not present

## 2019-03-22 DIAGNOSIS — R42 Dizziness and giddiness: Secondary | ICD-10-CM | POA: Diagnosis not present

## 2019-03-22 DIAGNOSIS — H838X2 Other specified diseases of left inner ear: Secondary | ICD-10-CM | POA: Diagnosis not present

## 2019-03-22 DIAGNOSIS — J0101 Acute recurrent maxillary sinusitis: Secondary | ICD-10-CM | POA: Diagnosis not present

## 2019-03-29 DIAGNOSIS — H8111 Benign paroxysmal vertigo, right ear: Secondary | ICD-10-CM | POA: Diagnosis not present

## 2019-03-29 DIAGNOSIS — R42 Dizziness and giddiness: Secondary | ICD-10-CM | POA: Diagnosis not present

## 2019-04-03 DIAGNOSIS — Z803 Family history of malignant neoplasm of breast: Secondary | ICD-10-CM | POA: Diagnosis not present

## 2019-04-03 DIAGNOSIS — Z1231 Encounter for screening mammogram for malignant neoplasm of breast: Secondary | ICD-10-CM | POA: Diagnosis not present

## 2019-04-03 LAB — HM MAMMOGRAPHY

## 2019-04-05 ENCOUNTER — Other Ambulatory Visit: Payer: Self-pay | Admitting: Internal Medicine

## 2019-04-05 ENCOUNTER — Encounter: Payer: Self-pay | Admitting: Internal Medicine

## 2019-04-10 DIAGNOSIS — I10 Essential (primary) hypertension: Secondary | ICD-10-CM | POA: Diagnosis not present

## 2019-04-10 DIAGNOSIS — I340 Nonrheumatic mitral (valve) insufficiency: Secondary | ICD-10-CM | POA: Diagnosis not present

## 2019-04-10 DIAGNOSIS — J45909 Unspecified asthma, uncomplicated: Secondary | ICD-10-CM | POA: Diagnosis not present

## 2019-04-10 DIAGNOSIS — I6381 Other cerebral infarction due to occlusion or stenosis of small artery: Secondary | ICD-10-CM | POA: Diagnosis not present

## 2019-04-10 DIAGNOSIS — M1990 Unspecified osteoarthritis, unspecified site: Secondary | ICD-10-CM | POA: Diagnosis not present

## 2019-04-10 DIAGNOSIS — I2510 Atherosclerotic heart disease of native coronary artery without angina pectoris: Secondary | ICD-10-CM | POA: Diagnosis not present

## 2019-04-10 DIAGNOSIS — E785 Hyperlipidemia, unspecified: Secondary | ICD-10-CM | POA: Diagnosis not present

## 2019-04-10 DIAGNOSIS — E559 Vitamin D deficiency, unspecified: Secondary | ICD-10-CM | POA: Diagnosis not present

## 2019-04-12 ENCOUNTER — Ambulatory Visit (INDEPENDENT_AMBULATORY_CARE_PROVIDER_SITE_OTHER): Payer: Medicare Other | Admitting: Internal Medicine

## 2019-04-12 ENCOUNTER — Ambulatory Visit (INDEPENDENT_AMBULATORY_CARE_PROVIDER_SITE_OTHER): Payer: Medicare Other

## 2019-04-12 ENCOUNTER — Ambulatory Visit: Payer: Medicare Other

## 2019-04-12 ENCOUNTER — Other Ambulatory Visit: Payer: Self-pay

## 2019-04-12 ENCOUNTER — Encounter: Payer: Self-pay | Admitting: Internal Medicine

## 2019-04-12 ENCOUNTER — Ambulatory Visit: Payer: Self-pay | Admitting: Pharmacist

## 2019-04-12 VITALS — BP 138/82 | HR 72 | Temp 98.3°F | Ht 61.8 in | Wt 175.4 lb

## 2019-04-12 DIAGNOSIS — E7800 Pure hypercholesterolemia, unspecified: Secondary | ICD-10-CM | POA: Diagnosis not present

## 2019-04-12 DIAGNOSIS — J4520 Mild intermittent asthma, uncomplicated: Secondary | ICD-10-CM

## 2019-04-12 DIAGNOSIS — Z0000 Encounter for general adult medical examination without abnormal findings: Secondary | ICD-10-CM | POA: Diagnosis not present

## 2019-04-12 DIAGNOSIS — J310 Chronic rhinitis: Secondary | ICD-10-CM | POA: Diagnosis not present

## 2019-04-12 DIAGNOSIS — J343 Hypertrophy of nasal turbinates: Secondary | ICD-10-CM | POA: Diagnosis not present

## 2019-04-12 DIAGNOSIS — R7309 Other abnormal glucose: Secondary | ICD-10-CM

## 2019-04-12 DIAGNOSIS — N182 Chronic kidney disease, stage 2 (mild): Secondary | ICD-10-CM

## 2019-04-12 DIAGNOSIS — H838X3 Other specified diseases of inner ear, bilateral: Secondary | ICD-10-CM | POA: Diagnosis not present

## 2019-04-12 DIAGNOSIS — H9042 Sensorineural hearing loss, unilateral, left ear, with unrestricted hearing on the contralateral side: Secondary | ICD-10-CM | POA: Diagnosis not present

## 2019-04-12 DIAGNOSIS — R42 Dizziness and giddiness: Secondary | ICD-10-CM | POA: Diagnosis not present

## 2019-04-12 DIAGNOSIS — J4531 Mild persistent asthma with (acute) exacerbation: Secondary | ICD-10-CM

## 2019-04-12 DIAGNOSIS — I129 Hypertensive chronic kidney disease with stage 1 through stage 4 chronic kidney disease, or unspecified chronic kidney disease: Secondary | ICD-10-CM

## 2019-04-12 DIAGNOSIS — Z6832 Body mass index (BMI) 32.0-32.9, adult: Secondary | ICD-10-CM | POA: Diagnosis not present

## 2019-04-12 DIAGNOSIS — Z23 Encounter for immunization: Secondary | ICD-10-CM | POA: Diagnosis not present

## 2019-04-12 DIAGNOSIS — K5909 Other constipation: Secondary | ICD-10-CM

## 2019-04-12 DIAGNOSIS — E6609 Other obesity due to excess calories: Secondary | ICD-10-CM | POA: Diagnosis not present

## 2019-04-12 DIAGNOSIS — I10 Essential (primary) hypertension: Secondary | ICD-10-CM

## 2019-04-12 DIAGNOSIS — J342 Deviated nasal septum: Secondary | ICD-10-CM | POA: Diagnosis not present

## 2019-04-12 LAB — POCT UA - MICROALBUMIN
Creatinine, POC: 50 mg/dL
Microalbumin Ur, POC: 10 mg/L

## 2019-04-12 LAB — POCT URINALYSIS DIPSTICK
Bilirubin, UA: NEGATIVE
Glucose, UA: NEGATIVE
Ketones, UA: NEGATIVE
Leukocytes, UA: NEGATIVE
Nitrite, UA: NEGATIVE
Protein, UA: NEGATIVE
Spec Grav, UA: 1.02 (ref 1.010–1.025)
Urobilinogen, UA: 0.2 E.U./dL
pH, UA: 7 (ref 5.0–8.0)

## 2019-04-12 MED ORDER — PREVNAR 13 IM SUSP: 0.5000 mL | INTRAMUSCULAR | 0 refills | Status: AC

## 2019-04-12 NOTE — Patient Instructions (Signed)
Paula Pollard , Thank you for taking time to come for your Medicare Wellness Visit. I appreciate your ongoing commitment to your health goals. Please review the following plan we discussed and let me know if I can assist you in the future.   Screening recommendations/referrals: Colonoscopy: 07/2018 Mammogram: 03/2019 Bone Density: 01/2018 Recommended yearly ophthalmology/optometry visit for glaucoma screening and checkup Recommended yearly dental visit for hygiene and checkup  Vaccinations: Influenza vaccine: decline Pneumococcal vaccine: sent to pharmacy Tdap vaccine: 03/2018 Shingles vaccine: discussed    Advanced directives: Please bring a copy of your POA (Power of [place]) and/or Living Will to your next appointment.    Conditions/risks identified: obesity  Next appointment: 04/12/2019   Preventive Care 14 Years and Older, Female Preventive care refers to lifestyle choices and visits with your health care provider that can promote health and wellness. What does preventive care include?  A yearly physical exam. This is also called an annual well check.  Dental exams once or twice a year.  Routine eye exams. Ask your health care provider how often you should have your eyes checked.  Personal lifestyle choices, including:  Daily care of your teeth and gums.  Regular physical activity.  Eating a healthy diet.  Avoiding tobacco and drug use.  Limiting alcohol use.  Practicing safe sex.  Taking low-dose aspirin every day.  Taking vitamin and mineral supplements as recommended by your health care provider. What happens during an annual well check? The services and screenings done by your health care provider during your annual well check will depend on your age, overall health, lifestyle risk factors, and family history of disease. Counseling  Your health care provider may ask you questions about your:  Alcohol use.  Tobacco use.  Drug use.  Emotional  well-being.  Home and relationship well-being.  Sexual activity.  Eating habits.  History of falls.  Memory and ability to understand (cognition).  Work and work Statistician.  Reproductive health. Screening  You may have the following tests or measurements:  Height, weight, and BMI.  Blood pressure.  Lipid and cholesterol levels. These may be checked every 5 years, or more frequently if you are over 13 years old.  Skin check.  Lung cancer screening. You may have this screening every year starting at age 16 if you have a 30-pack-year history of smoking and currently smoke or have quit within the past 15 years.  Fecal occult blood test (FOBT) of the stool. You may have this test every year starting at age 13.  Flexible sigmoidoscopy or colonoscopy. You may have a sigmoidoscopy every 5 years or a colonoscopy every 10 years starting at age 79.  Hepatitis C blood test.  Hepatitis B blood test.  Sexually transmitted disease (STD) testing.  Diabetes screening. This is done by checking your blood sugar (glucose) after you have not eaten for a while (fasting). You may have this done every 1-3 years.  Bone density scan. This is done to screen for osteoporosis. You may have this done starting at age 51.  Mammogram. This may be done every 1-2 years. Talk to your health care provider about how often you should have regular mammograms. Talk with your health care provider about your test results, treatment options, and if necessary, the need for more tests. Vaccines  Your health care provider may recommend certain vaccines, such as:  Influenza vaccine. This is recommended every year.  Tetanus, diphtheria, and acellular pertussis (Tdap, Td) vaccine. You may need a Td booster  every 10 years.  Zoster vaccine. You may need this after age 80.  Pneumococcal 13-valent conjugate (PCV13) vaccine. One dose is recommended after age 88.  Pneumococcal polysaccharide (PPSV23) vaccine. One  dose is recommended after age 62. Talk to your health care provider about which screenings and vaccines you need and how often you need them. This information is not intended to replace advice given to you by your health care provider. Make sure you discuss any questions you have with your health care provider. Document Released: 06/21/2015 Document Revised: 02/12/2016 Document Reviewed: 03/26/2015 Elsevier Interactive Patient Education  2017 [place] Prevention in the Home Falls can cause injuries. They can happen to people of all ages. There are many things you can do to make your home safe and to help prevent falls. What can I do on the outside of my home?  Regularly fix the edges of walkways and driveways and fix any cracks.  Remove anything that might make you trip as you walk through a door, such as a raised step or threshold.  Trim any bushes or trees on the path to your home.  Use bright outdoor lighting.  Clear any walking paths of anything that might make someone trip, such as rocks or tools.  Regularly check to see if handrails are loose or broken. Make sure that both sides of any steps have handrails.  Any raised decks and porches should have guardrails on the edges.  Have any leaves, snow, or ice cleared regularly.  Use sand or salt on walking paths during winter.  Clean up any spills in your garage right away. This includes oil or grease spills. What can I do in the bathroom?  Use night lights.  Install grab bars by the toilet and in the tub and shower. Do not use towel bars as grab bars.  Use non-skid mats or decals in the tub or shower.  If you need to sit down in the shower, use a plastic, non-slip stool.  Keep the floor dry. Clean up any water that spills on the floor as soon as it happens.  Remove soap buildup in the tub or shower regularly.  Attach bath mats securely with double-sided non-slip rug tape.  Do not have throw rugs and other  things on the floor that can make you trip. What can I do in the bedroom?  Use night lights.  Make sure that you have a light by your bed that is easy to reach.  Do not use any sheets or blankets that are too big for your bed. They should not hang down onto the floor.  Have a firm chair that has side arms. You can use this for support while you get dressed.  Do not have throw rugs and other things on the floor that can make you trip. What can I do in the kitchen?  Clean up any spills right away.  Avoid walking on wet floors.  Keep items that you use a lot in easy-to-reach places.  If you need to reach something above you, use a strong step stool that has a grab bar.  Keep electrical cords out of the way.  Do not use floor polish or wax that makes floors slippery. If you must use wax, use non-skid floor wax.  Do not have throw rugs and other things on the floor that can make you trip. What can I do with my stairs?  Do not leave any items on the stairs.  Make  sure that there are handrails on both sides of the stairs and use them. Fix handrails that are broken or loose. Make sure that handrails are as long as the stairways.  Check any carpeting to make sure that it is firmly attached to the stairs. Fix any carpet that is loose or worn.  Avoid having throw rugs at the top or bottom of the stairs. If you do have throw rugs, attach them to the floor with carpet tape.  Make sure that you have a light switch at the top of the stairs and the bottom of the stairs. If you do not have them, ask someone to add them for you. What else can I do to help prevent falls?  Wear shoes that:  Do not have high heels.  Have rubber bottoms.  Are comfortable and fit you well.  Are closed at the toe. Do not wear sandals.  If you use a stepladder:  Make sure that it is fully opened. Do not climb a closed stepladder.  Make sure that both sides of the stepladder are locked into place.  Ask  someone to hold it for you, if possible.  Clearly mark and make sure that you can see:  Any grab bars or handrails.  First and last steps.  Where the edge of each step is.  Use tools that help you move around (mobility aids) if they are needed. These include:  Canes.  Walkers.  Scooters.  Crutches.  Turn on the lights when you go into a dark area. Replace any light bulbs as soon as they burn out.  Set up your furniture so you have a clear path. Avoid moving your furniture around.  If any of your floors are uneven, fix them.  If there are any pets around you, be aware of where they are.  Review your medicines with your doctor. Some medicines can make you feel dizzy. This can increase your chance of falling. Ask your doctor what other things that you can do to help prevent falls. This information is not intended to replace advice given to you by your health care provider. Make sure you discuss any questions you have with your health care provider. Document Released: 03/21/2009 Document Revised: 10/31/2015 Document Reviewed: 06/29/2014 Elsevier Interactive Patient Education  2017 [employer].

## 2019-04-12 NOTE — Patient Instructions (Signed)
Health Maintenance, Female Adopting a healthy lifestyle and getting preventive care are important in promoting health and wellness. Ask your health care provider about:  The right schedule for you to have regular tests and exams.  Things you can do on your own to prevent diseases and keep yourself healthy. What should I know about diet, weight, and exercise? Eat a healthy diet   Eat a diet that includes plenty of vegetables, fruits, low-fat dairy products, and lean protein.  Do not eat a lot of foods that are high in solid fats, added sugars, or sodium. Maintain a healthy weight Body mass index (BMI) is used to identify weight problems. It estimates body fat based on height and weight. Your health care provider can help determine your BMI and help you achieve or maintain a healthy weight. Get regular exercise Get regular exercise. This is one of the most important things you can do for your health. Most adults should:  Exercise for at least 150 minutes each week. The exercise should increase your heart rate and make you sweat (moderate-intensity exercise).  Do strengthening exercises at least twice a week. This is in addition to the moderate-intensity exercise.  Spend less time sitting. Even light physical activity can be beneficial. Watch cholesterol and blood lipids Have your blood tested for lipids and cholesterol at 83 years of age, then have this test every 5 years. Have your cholesterol levels checked more often if:  Your lipid or cholesterol levels are high.  You are older than 83 years of age.  You are at high risk for heart disease. What should I know about cancer screening? Depending on your health history and family history, you may need to have cancer screening at various ages. This may include screening for:  Breast cancer.  Cervical cancer.  Colorectal cancer.  Skin cancer.  Lung cancer. What should I know about heart disease, diabetes, and high blood  pressure? Blood pressure and heart disease  High blood pressure causes heart disease and increases the risk of stroke. This is more likely to develop in people who have high blood pressure readings, are of African descent, or are overweight.  Have your blood pressure checked: ? Every 3-5 years if you are 18-39 years of age. ? Every year if you are 40 years old or older. Diabetes Have regular diabetes screenings. This checks your fasting blood sugar level. Have the screening done:  Once every three years after age 40 if you are at a normal weight and have a low risk for diabetes.  More often and at a younger age if you are overweight or have a high risk for diabetes. What should I know about preventing infection? Hepatitis B If you have a higher risk for hepatitis B, you should be screened for this virus. Talk with your health care provider to find out if you are at risk for hepatitis B infection. Hepatitis C Testing is recommended for:  Everyone born from 1945 through 1965.  Anyone with known risk factors for hepatitis C. Sexually transmitted infections (STIs)  Get screened for STIs, including gonorrhea and chlamydia, if: ? You are sexually active and are younger than 83 years of age. ? You are older than 83 years of age and your health care provider tells you that you are at risk for this type of infection. ? Your sexual activity has changed since you were last screened, and you are at increased risk for chlamydia or gonorrhea. Ask your health care provider if   you are at risk.  Ask your health care provider about whether you are at high risk for HIV. Your health care provider may recommend a prescription medicine to help prevent HIV infection. If you choose to take medicine to prevent HIV, you should first get tested for HIV. You should then be tested every 3 months for as long as you are taking the medicine. Pregnancy  If you are about to stop having your period (premenopausal) and  you may become pregnant, seek counseling before you get pregnant.  Take 400 to 800 micrograms (mcg) of folic acid every day if you become pregnant.  Ask for birth control (contraception) if you want to prevent pregnancy. Osteoporosis and menopause Osteoporosis is a disease in which the bones lose minerals and strength with aging. This can result in bone fractures. If you are 65 years old or older, or if you are at risk for osteoporosis and fractures, ask your health care provider if you should:  Be screened for bone loss.  Take a calcium or vitamin D supplement to lower your risk of fractures.  Be given hormone replacement therapy (HRT) to treat symptoms of menopause. Follow these instructions at home: Lifestyle  Do not use any products that contain nicotine or tobacco, such as cigarettes, e-cigarettes, and chewing tobacco. If you need help quitting, ask your health care provider.  Do not use street drugs.  Do not share needles.  Ask your health care provider for help if you need support or information about quitting drugs. Alcohol use  Do not drink alcohol if: ? Your health care provider tells you not to drink. ? You are pregnant, may be pregnant, or are planning to become pregnant.  If you drink alcohol: ? Limit how much you use to 0-1 drink a day. ? Limit intake if you are breastfeeding.  Be aware of how much alcohol is in your drink. In the [place] one drink equals one 12 oz bottle of beer (355 mL), one 5 oz glass of wine (148 mL), or one 1 oz glass of hard liquor (44 mL). General instructions  Schedule regular health, dental, and eye exams.  Stay current with your vaccines.  Tell your health care provider if: ? You often feel depressed. ? You have ever been abused or do not feel safe at home. Summary  Adopting a healthy lifestyle and getting preventive care are important in promoting health and wellness.  Follow your health care provider's instructions about healthy  diet, exercising, and getting tested or screened for diseases.  Follow your health care provider's instructions on monitoring your cholesterol and blood pressure. This information is not intended to replace advice given to you by your health care provider. Make sure you discuss any questions you have with your health care provider. Document Released: 12/08/2010 Document Revised: 05/18/2018 Document Reviewed: 05/18/2018 Elsevier Patient Education  2020 Elsevier Inc.  

## 2019-04-12 NOTE — Progress Notes (Signed)
Chronic Care Management   Initial Visit Note  04/12/2019 Name: Paula Pollard MRN: [id number] DOB: 04/01/1930  Referred by: Glendale Chard, MD Reason for referral : Chronic Care Management   Paula Pollard is a 85 y.o. year old female who is a primary care patient of Glendale Chard, MD. The CCM team was consulted for assistance with chronic disease management and care coordination needs related to asthma  Review of patient status, including review of consultants reports, relevant laboratory and other test results, and collaboration with appropriate care team members and the patient's provider was performed as part of comprehensive patient evaluation and provision of chronic care management services.    I met with Paula Pollard in clinic today  Medications: Outpatient Encounter Medications as of 04/12/2019  Medication Sig  . sertraline (ZOLOFT) 25 MG tablet TAKE 1 TABLET BY MOUTH EVERY DAY  . Ascorbic Acid (VITAMIN C PO) Take 1 tablet by mouth daily.   Marland Kitchen aspirin EC 81 MG tablet Take 81 mg by mouth daily.  Marland Kitchen atorvastatin (LIPITOR) 40 MG tablet Take 40 mg by mouth daily.  . Calcium 200 MG TABS Take 200 mg by mouth daily.  . cholecalciferol (VITAMIN D) 400 units TABS tablet Take 1,200 Units by mouth daily.  . Cyanocobalamin (B-12 PO) Take 1 tablet by mouth as needed (for energy).  Marland Kitchen esomeprazole (NEXIUM) 20 MG capsule Take 20 mg by mouth as needed (for heartburn).  . fluticasone (FLONASE) 50 MCG/ACT nasal spray Place 2 sprays into both nostrils daily as needed for allergies or rhinitis. (Patient taking differently: Place 1 spray into both nostrils daily as needed for allergies or rhinitis. )  . fluticasone furoate-vilanterol (BREO ELLIPTA) 200-25 MCG/INH AEPB Inhale 1 puff into the lungs daily.  Marland Kitchen LORazepam (ATIVAN) 0.5 MG tablet Take 1 tablet (0.5 mg total) by mouth every 12 (twelve) hours as needed for anxiety.  . Multiple Vitamins-Minerals (ECHINACEA ACZ PO) Take 1 capsule by mouth as  needed (for immune support).  . nebivolol (BYSTOLIC) 5 MG tablet Take 1 tablet (5 mg total) by mouth daily.  . [EXPIRED] pneumococcal 13-valent conjugate vaccine (PREVNAR 13) SUSP injection Inject 0.5 mLs into the muscle tomorrow at 10 am for 1 dose.  Vladimir Faster Glycol-Propyl Glycol (SYSTANE) 0.4-0.3 % SOLN Apply 1 drop to eye 2 (two) times a day.   . telmisartan (MICARDIS) 20 MG tablet TAKE 1 TABLET BY MOUTH EVERY DAY  . triamcinolone cream (KENALOG) 0.1 % Apply 1 application topically 2 (two) times daily as needed.  . triamterene-hydrochlorothiazide (MAXZIDE-25) 37.5-25 MG tablet Take 1/2 tablet by mouth daily  . vitamin E 400 UNIT capsule Take 400 Units by mouth daily.  . [DISCONTINUED] Calcium Carbonate-Vitamin D (CALCIUM-VITAMIN D3 PO) Take 1 tablet by mouth every morning. 1228m of calcium  . [DISCONTINUED] fexofenadine (ALLEGRA) 180 MG tablet Take 180 mg by mouth daily as needed for allergies.   . [DISCONTINUED] meclizine (ANTIVERT) 25 MG tablet Take 1 tablet (25 mg total) by mouth 3 (three) times daily as needed for dizziness.   No facility-administered encounter medications on file as of 04/12/2019.      Objective:   Goals Addressed            This Visit's Progress     Patient Stated   . I would like to manage my asthma (pt-stated)       Current Barriers:  . Current asthma treatment regimen: Breo Ellipta . Denies current SOB/wheezing . Does not have rescue inhaler--will have called  in . Patient currently on symptomatic control with flonase and anti-histamines . She is not a smoker . BP today 138/82, HR 72 . Currently manages anxiety with PRN alprazolam,  she has not been taking sertaline because it makes her feel different  Pharmacist Clinical Goal(s):  Marland Kitchen Over the next 90 days, patient with work with PharmD and primary care provider to address disease state management of asthma  Interventions: . Comprehensive medication review performed, medication list updated in  electronic medical record . Counseled on appropriate use . Patient states she will consider patient assistance program, however she needs time to think about it  Patient Self Care Activities:  Patient will take medications as prescribed . Patient will contact provider with any episodes of SOB/wheezing . Patient will report any questions or concerns to provider   Initial goal documentation         Paula Pollard was given information about Chronic Care Management services today including:  1. CCM service includes personalized support from designated clinical staff supervised by her physician, including individualized plan of care and coordination with other care providers 2. 24/7 contact phone numbers for assistance for urgent and routine care needs. 3. Service will only be billed when office clinical staff spend 20 minutes or more in a month to coordinate care. 4. Only one practitioner may furnish and bill the service in a calendar month. 5. The patient may stop CCM services at any time (effective at the end of the month) by phone call to the office staff. 6. The patient will be responsible for cost sharing (co-pay) of up to 20% of the service fee (after annual deductible is met).  Patient agreed to services and verbal consent obtained.   Plan:   The care management team will reach out to the patient again over the next 30 days.   Provider Signature Regina Eck, PharmD, BCPS Clinical Pharmacist, [facility] [place]: [phone]

## 2019-04-12 NOTE — Progress Notes (Signed)
Subjective:     Patient ID: Paula Pollard , female    DOB: December 31, 1929 , 85 y.o.   MRN: [id number]   Chief Complaint  Patient presents with  . Annual Exam  . Hypertension    HPI  She is here today for a full physical examination. She had her AWV performed by [facility] advisor earlier today.  She is no longer followed by GYN.  She is also followed by Dr. Terrence Dupont.   Hypertension This is a chronic problem. The current episode started more than 1 year ago. The problem has been gradually improving since onset. The problem is controlled. Pertinent negatives include no blurred vision, chest pain, palpitations or shortness of breath. Risk factors for coronary artery disease include post-menopausal state, obesity and dyslipidemia. The current treatment provides moderate improvement. Compliance problems include exercise.  Hypertensive end-organ damage includes kidney disease.     Past Medical History:  Diagnosis Date  . Anemia    "in past as a young girl"  . Anxiety   . Arthritis   . Asthma   . Bronchitis    hx of  . Cataract    bilaterally, "no surgery at this time"  . Diabetes mellitus without complication ([place])    pre diabetic  . Dysrhythmia    when gets excited  . GERD (gastroesophageal reflux disease)   . Headache(784.0)    hx of migraines  . History of hiatal hernia   . Hyperlipidemia   . Hypertension    sees Dr. Terrence Dupont  . Neuromuscular disorder (HCC)    hx of carpal tunnel "never had surgery for"right hand     Family History  Problem Relation Age of Onset  . Heart attack Mother   . Heart attack Father   . Stroke Father   . Stroke Daughter   . Colon cancer Sister      Current Outpatient Medications:  .  Ascorbic Acid (VITAMIN C PO), Take 1 tablet by mouth daily. , Disp: , Rfl:  .  aspirin EC 81 MG tablet, Take 81 mg by mouth daily., Disp: , Rfl:  .  atorvastatin (LIPITOR) 40 MG tablet, Take 40 mg by mouth daily., Disp: , Rfl:  .  Calcium 200 MG TABS, Take 200 mg  by mouth daily., Disp: , Rfl:  .  cholecalciferol (VITAMIN D) 400 units TABS tablet, Take 1,200 Units by mouth daily., Disp: , Rfl:  .  Cyanocobalamin (B-12 PO), Take 1 tablet by mouth as needed (for energy)., Disp: , Rfl:  .  esomeprazole (NEXIUM) 20 MG capsule, Take 20 mg by mouth as needed (for heartburn)., Disp: , Rfl:  .  fexofenadine (ALLEGRA) 180 MG tablet, Take 180 mg by mouth daily as needed for allergies. , Disp: , Rfl:  .  fluticasone (FLONASE) 50 MCG/ACT nasal spray, Place 2 sprays into both nostrils daily as needed for allergies or rhinitis. (Patient taking differently: Place 1 spray into both nostrils daily as needed for allergies or rhinitis. ), Disp: 18.2 g, Rfl: 5 .  fluticasone furoate-vilanterol (BREO ELLIPTA) 200-25 MCG/INH AEPB, Inhale 1 puff into the lungs daily., Disp: 1 each, Rfl: 5 .  LORazepam (ATIVAN) 0.5 MG tablet, Take 1 tablet (0.5 mg total) by mouth every 12 (twelve) hours as needed for anxiety., Disp: 30 tablet, Rfl: 0 .  meclizine (ANTIVERT) 25 MG tablet, Take 1 tablet (25 mg total) by mouth 3 (three) times daily as needed for dizziness., Disp: 30 tablet, Rfl: 0 .  Multiple Vitamins-Minerals (ECHINACEA ACZ  PO), Take 1 capsule by mouth as needed (for immune support)., Disp: , Rfl:  .  nebivolol (BYSTOLIC) 5 MG tablet, Take 1 tablet (5 mg total) by mouth daily., Disp: 30 tablet, Rfl: 0 .  pneumococcal 13-valent conjugate vaccine (PREVNAR 13) SUSP injection, Inject 0.5 mLs into the muscle tomorrow at 10 am for 1 dose., Disp: 0.5 mL, Rfl: 0 .  Polyethyl Glycol-Propyl Glycol (SYSTANE) 0.4-0.3 % SOLN, Apply 1 drop to eye 2 (two) times a day. , Disp: , Rfl:  .  sertraline (ZOLOFT) 25 MG tablet, TAKE 1 TABLET BY MOUTH EVERY DAY, Disp: 90 tablet, Rfl: 1 .  telmisartan (MICARDIS) 20 MG tablet, TAKE 1 TABLET BY MOUTH EVERY DAY, Disp: 90 tablet, Rfl: 1 .  triamcinolone cream (KENALOG) 0.1 %, Apply 1 application topically 2 (two) times daily as needed., Disp: 30 g, Rfl: 1 .   triamterene-hydrochlorothiazide (MAXZIDE-25) 37.5-25 MG tablet, Take 1/2 tablet by mouth daily, Disp: 15 tablet, Rfl: 3 .  vitamin E 400 UNIT capsule, Take 400 Units by mouth daily., Disp: , Rfl:    Allergies  Allergen Reactions  . Codeine Nausea Only and Other (See Comments)    Reaction:Dizziness and hallucinations "makes me climb walls"     The patient states she uses post menopausal status for birth control. Last LMP was No LMP recorded. Patient is postmenopausal.. Negative for Dysmenorrhea  Negative for: breast discharge, breast lump(s), breast pain and breast self exam. Associated symptoms include abnormal vaginal bleeding. Pertinent negatives include abnormal bleeding (hematology), anxiety, decreased libido, depression, difficulty falling sleep, dyspareunia, history of infertility, nocturia, sexual dysfunction, sleep disturbances, urinary incontinence, urinary urgency, vaginal discharge and vaginal itching. Diet regular.The patient states her exercise level is  intermittent.   . The patient's tobacco use is:  Social History   Tobacco Use  Smoking Status Never Smoker  Smokeless Tobacco Never Used  . She has been exposed to passive smoke. The patient's alcohol use is:  Social History   Substance and Sexual Activity  Alcohol Use No    Review of Systems  Constitutional: Negative.   HENT: Negative.   Eyes: Negative.  Negative for blurred vision.  Respiratory: Negative.  Negative for shortness of breath.   Cardiovascular: Negative.  Negative for chest pain and palpitations.  Gastrointestinal: Positive for constipation.       She c/o constipation. She reports she has BM once weekly. She denies abdominal pain. No blood in stools. Denies recent dietary change.   Endocrine: Negative.   Genitourinary: Negative.   Musculoskeletal: Negative.   Skin: Negative.   Allergic/Immunologic: Negative.   Neurological: Negative.   Hematological: Negative.   Psychiatric/Behavioral: Negative.       Today's Vitals   04/12/19 0934  BP: 138/82  Pulse: 72  Temp: 98.3 F (36.8 C)  TempSrc: Oral  Weight: 175 lb 6.4 oz (79.6 kg)  Height: 5' 1.8" (1.57 m)   Body mass index is 32.29 kg/m.   Objective:  Physical Exam Vitals signs and nursing note reviewed.  Constitutional:      Appearance: Normal appearance. She is obese.  HENT:     Head: Normocephalic and atraumatic.     Right Ear: Tympanic membrane, ear canal and external ear normal.     Left Ear: Tympanic membrane, ear canal and external ear normal.     Nose:     Comments: Deferred, masked    Mouth/Throat:     Comments: Deferred, masked Eyes:     Extraocular Movements: Extraocular movements intact.  Conjunctiva/sclera: Conjunctivae normal.     Pupils: Pupils are equal, round, and reactive to light.  Neck:     Musculoskeletal: Normal range of motion and neck supple.  Cardiovascular:     Rate and Rhythm: Normal rate and regular rhythm.     Pulses: Normal pulses.     Heart sounds: Normal heart sounds.  Pulmonary:     Effort: Pulmonary effort is normal.     Breath sounds: Normal breath sounds.  Chest:     Breasts: Tanner Score is 5.        Right: Normal.        Left: Normal.  Abdominal:     General: Abdomen is flat. Bowel sounds are normal.     Palpations: Abdomen is soft.  Genitourinary:    Comments: deferred Musculoskeletal: Normal range of motion.  Skin:    General: Skin is warm and dry.  Neurological:     General: No focal deficit present.     Mental Status: She is alert and oriented to person, place, and time.  Psychiatric:        Mood and Affect: Mood normal.        Behavior: Behavior normal.         Assessment And Plan:     1. Routine general medical examination at health care facility  A full exam was performed.  Importance of monthly self breast exams was discussed with the patient.  PATIENT HAS BEEN ADVISED TO GET 30-45 MINUTES REGULAR EXERCISE NO LESS THAN FOUR TO FIVE DAYS PER WEEK - BOTH  WEIGHTBEARING EXERCISES AND AEROBIC ARE RECOMMENDED.  SHE WAS ADVISED TO FOLLOW A HEALTHY DIET WITH AT LEAST SIX FRUITS/VEGGIES PER DAY, DECREASE INTAKE OF RED MEAT, AND TO INCREASE FISH INTAKE TO TWO DAYS PER WEEK.  MEATS/FISH SHOULD NOT BE FRIED, BAKED OR BROILED IS PREFERABLE.  I SUGGEST WEARING SPF 50 SUNSCREEN ON EXPOSED PARTS AND ESPECIALLY WHEN IN THE DIRECT SUNLIGHT FOR AN EXTENDED PERIOD OF TIME.  PLEASE AVOID FAST FOOD RESTAURANTS AND INCREASE YOUR WATER INTAKE.   2. Hypertensive nephropathy  Chronic, fair control.  She will continue with current meds for now. EKG performed, NSR w LBBB - no acute changes noted.  She is encouraged to limit her salt intake. She will rto in six months for re-evaluation.   - CMP14+EGFR - Magnesium  3. Chronic renal disease, stage II  Chronic, I will check a GFR, Cr today.  Chronic. She is encouraged to stay well hydrated.  4. Other abnormal glucose  HER A1C HAS BEEN ELEVATED IN THE PAST. I WILL CHECK AN A1C, BMET TODAY. SHE WAS ENCOURAGED TO AVOID SUGARY BEVERAGES AND PROCESSED FOODS INCLUDNG BREADS, RICE AND PASTA.  - Hemoglobin A1c  5. Other constipation  She was advised to increase her fiber and water intake.  She was advised she would likely benefit from magnesium supplementation.  She was advised to take 250-440m nightly. She will let me know if her sx persist.   - Magnesium  6. Mild intermittent asthma without complication  Chronic, yet stable. She is encouraged to try to avoid her triggers if possible. She will continue with Breo.   - Referral to Chronic Care Management Services  7. Class 1 obesity due to excess calories with serious comorbidity and body mass index (BMI) of 32.0 to 32.9 in adult  Importance of achieving optimal weight to decrease risk of cardiovascular disease and cancers was discussed with the patient in full detail. Importance of regular exercise  was discussed with the patient.  She is encouraged to start slowly -  start with 10 minutes twice daily at least three to four days per week and to gradually build to 30 minutes five days weekly. She was given tips to incorporate more activity into her daily routine - take stairs when possible, park farther away from grocery stores, etc.    [name], MD    THE PATIENT IS ENCOURAGED TO PRACTICE SOCIAL DISTANCING DUE TO THE COVID-19 PANDEMIC.

## 2019-04-12 NOTE — Progress Notes (Signed)
Subjective:   Paula Pollard is a 85 y.o. female who presents for Medicare Annual (Subsequent) preventive examination.  Review of Systems:  n/a Cardiac Risk Factors include: advanced age (>40men, >26 women);hypertension;obesity (BMI >30kg/m2)     Objective:     Vitals: BP 138/82 (BP Location: Left Arm, Patient Position: Sitting, Cuff Size: Normal)   Pulse 72   Temp 98.3 F (36.8 C) (Oral)   Ht 5' 1.8" (1.57 m)   Wt 175 lb 6.4 oz (79.6 kg)   SpO2 97%   BMI 32.29 kg/m   Body mass index is 32.29 kg/m.  Advanced Directives 04/12/2019 10/28/2018 04/06/2018 12/31/2017 02/26/2015 12/26/2014 08/10/2012  Does Patient Have a Medical Advance Directive? Yes No No No Yes No Patient has advance directive, copy not in chart  Type of Advance Directive [place];Living will - - - [place];Living will - -  Copy of [place] in Chart? No - copy requested - - - - - Copy requested from other (Comment)  Would patient like information on creating a medical advance directive? - No - Patient declined No - Patient declined Yes (MAU/Ambulatory/Procedural Areas - Information given) - - -    Tobacco Social History   Tobacco Use  Smoking Status Never Smoker  Smokeless Tobacco Never Used     Counseling given: Not Answered   Clinical Intake:  Pre-visit preparation completed: Yes  Pain : No/denies pain     Nutritional Status: BMI > 30  Obese Nutritional Risks: None Diabetes: No  How often do you need to have someone help you when you read instructions, pamphlets, or other written materials from your doctor or pharmacy?: 1 - Never What is the last grade level you completed in school?: 2 years college  Interpreter Needed?: No  Information entered by :: NAllen LPN  Past Medical History:  Diagnosis Date  . Anemia    "in past as a young girl"  . Anxiety   . Arthritis   . Asthma   . Bronchitis    hx of  . Cataract    bilaterally,  "no surgery at this time"  . Diabetes mellitus without complication ([place])    pre diabetic  . Dysrhythmia    when gets excited  . GERD (gastroesophageal reflux disease)   . Headache(784.0)    hx of migraines  . History of hiatal hernia   . Hyperlipidemia   . Hypertension    sees Dr. Terrence Dupont  . Neuromuscular disorder ([place])    hx of carpal tunnel "never had surgery for"right hand   Past Surgical History:  Procedure Laterality Date  . CARDIAC CATHETERIZATION     5 years ago no problems  . COLONOSCOPY  07/2018   polypectomy x 5  . DILATATION & CURETTAGE/HYSTEROSCOPY WITH MYOSURE N/A 01/06/2018   Procedure: DILATATION & CURETTAGE/HYSTEROSCOPY WITH MYOSURE;  Surgeon: Servando Salina, MD;  Location: [facility];  Service: Gynecology;  Laterality: N/A;  . DOPPLER ECHOCARDIOGRAPHY     hx of  . NECK SURGERY     2002, cyst removed,   . removal of toe nails     both feet  . TOTAL HIP ARTHROPLASTY Right 08/08/2012   Dr Mayer Camel  . TOTAL HIP ARTHROPLASTY Right 08/08/2012   Procedure: TOTAL HIP ARTHROPLASTY;  Surgeon: Kerin Salen, MD;  Location: [facility];  Service: Orthopedics;  Laterality: Right;  . TUBAL LIGATION     1970   Family History  Problem Relation Age of  Onset  . Heart attack Mother   . Heart attack Father   . Stroke Father   . Stroke Daughter   . Colon cancer Sister    Social History   Socioeconomic History  . Marital status: Divorced    Spouse name: Not on file  . Number of children: Not on file  . Years of education: Not on file  . Highest education level: Not on file  Occupational History  . Occupation: retired  Scientific laboratory technician  . Financial resource strain: Not hard at all  . Food insecurity    Worry: Never true    Inability: Never true  . Transportation needs    Medical: No    Non-medical: No  Tobacco Use  . Smoking status: Never Smoker  . Smokeless tobacco: Never Used  Substance and Sexual Activity  . Alcohol use: No  . Drug use: No  . Sexual activity: Not  Currently  Lifestyle  . Physical activity    Days per week: 7 days    Minutes per session: 30 min  . Stress: Not at all  Relationships  . Social Herbalist on phone: Not on file    Gets together: Not on file    Attends religious service: Not on file    Active member of club or organization: Not on file    Attends meetings of clubs or organizations: Not on file    Relationship status: Not on file  Other Topics Concern  . Not on file  Social History Narrative  . Not on file    Outpatient Encounter Medications as of 04/12/2019  Medication Sig  . Ascorbic Acid (VITAMIN C PO) Take 1 tablet by mouth daily.   Marland Kitchen aspirin EC 81 MG tablet Take 81 mg by mouth daily.  Marland Kitchen atorvastatin (LIPITOR) 40 MG tablet Take 40 mg by mouth daily.  . Calcium 200 MG TABS Take 200 mg by mouth daily.  . cholecalciferol (VITAMIN D) 400 units TABS tablet Take 1,200 Units by mouth daily.  . Cyanocobalamin (B-12 PO) Take 1 tablet by mouth as needed (for energy).  Marland Kitchen esomeprazole (NEXIUM) 20 MG capsule Take 20 mg by mouth as needed (for heartburn).  . fexofenadine (ALLEGRA) 180 MG tablet Take 180 mg by mouth daily as needed for allergies.   . fluticasone (FLONASE) 50 MCG/ACT nasal spray Place 2 sprays into both nostrils daily as needed for allergies or rhinitis. (Patient taking differently: Place 1 spray into both nostrils daily as needed for allergies or rhinitis. )  . fluticasone furoate-vilanterol (BREO ELLIPTA) 200-25 MCG/INH AEPB Inhale 1 puff into the lungs daily.  Marland Kitchen LORazepam (ATIVAN) 0.5 MG tablet Take 1 tablet (0.5 mg total) by mouth every 12 (twelve) hours as needed for anxiety.  . meclizine (ANTIVERT) 25 MG tablet Take 1 tablet (25 mg total) by mouth 3 (three) times daily as needed for dizziness.  . Multiple Vitamins-Minerals (ECHINACEA ACZ PO) Take 1 capsule by mouth as needed (for immune support).  . nebivolol (BYSTOLIC) 5 MG tablet Take 1 tablet (5 mg total) by mouth daily.  Vladimir Faster  Glycol-Propyl Glycol (SYSTANE) 0.4-0.3 % SOLN Apply 1 drop to eye 2 (two) times a day.   . sertraline (ZOLOFT) 25 MG tablet TAKE 1 TABLET BY MOUTH EVERY DAY  . telmisartan (MICARDIS) 20 MG tablet TAKE 1 TABLET BY MOUTH EVERY DAY  . triamcinolone cream (KENALOG) 0.1 % Apply 1 application topically 2 (two) times daily as needed.  . triamterene-hydrochlorothiazide (MAXZIDE-25)  37.5-25 MG tablet Take 1/2 tablet by mouth daily  . vitamin E 400 UNIT capsule Take 400 Units by mouth daily.  . pneumococcal 13-valent conjugate vaccine (PREVNAR 13) SUSP injection Inject 0.5 mLs into the muscle tomorrow at 10 am for 1 dose.  . [DISCONTINUED] Calcium Carbonate-Vitamin D (CALCIUM-VITAMIN D3 PO) Take 1 tablet by mouth every morning. 1200mg  of calcium   No facility-administered encounter medications on file as of 04/12/2019.     Activities of Daily Living In your present state of health, do you have any difficulty performing the following activities: 04/12/2019 10/28/2018  Hearing? N N  Vision? N N  Difficulty concentrating or making decisions? N N  Walking or climbing stairs? Y N  Comment takes one step at a time -  Dressing or bathing? N N  Doing errands, shopping? N Y  Conservation officer, nature and eating ? N -  Using the Toilet? N -  In the past six months, have you accidently leaked urine? N -  Do you have problems with loss of bowel control? N -  Managing your Medications? N -  Managing your Finances? N -  Housekeeping or managing your Housekeeping? N -  Some recent data might be hidden    Patient Care Team: Glendale Chard, MD as PCP - General (Internal Medicine) Rutherford Guys, MD as Consulting Physician (Ophthalmology)    Assessment:   This is a routine wellness examination for [place].  Exercise Activities and Dietary recommendations Current Exercise Habits: Home exercise routine, Type of exercise: calisthenics, Time (Minutes): 30, Frequency (Times/Week): 7, Weekly Exercise (Minutes/Week): 210   Goals    . Patient Stated     04/12/2019, wants to work on strengthen back    . Weight (lb) < 200 lb (90.7 kg)     Patient would like to remain healthy       Fall Risk Fall Risk  04/12/2019 11/21/2018 10/18/2018 05/19/2018 04/27/2018  Falls in the past year? 0 0 0 0 0  Comment - - - - Emmi Telephone Survey: data to providers prior to load  Number falls in past yr: 0 - - - -  Risk for fall due to : Medication side effect - - - -  Follow up Falls evaluation completed;Education provided;Falls prevention discussed - - - -   Is the patient's home free of loose throw rugs in walkways, pet beds, electrical cords, etc?   yes      Grab bars in the bathroom? yes      Handrails on the stairs?   n/a      Adequate lighting?   yes  Timed Get Up and Go performed: n/a  Depression Screen PHQ 2/9 Scores 04/12/2019 11/21/2018 10/18/2018 05/19/2018  PHQ - 2 Score 0 0 0 0  PHQ- 9 Score 0 - - -     Cognitive Function     6CIT Screen 04/12/2019 04/06/2018  What Year? 0 points 0 points  What month? 0 points 0 points  What time? 0 points 0 points  Count back from 20 0 points 0 points  Months in reverse 2 points 2 points  Repeat phrase 2 points 0 points  Total Score 4 2     There is no immunization history on file for this patient.  Qualifies for Shingles Vaccine? yes  Screening Tests Health Maintenance  Topic Date Due  . PNA vac Low Risk Adult (2 of 2 - PPSV23) 02/17/2019  . INFLUENZA VACCINE  09/06/2019 (Originally 01/07/2019)  . TETANUS/TDAP  04/01/2028  . DEXA SCAN  Completed    Cancer Screenings: Lung: Low Dose CT Chest recommended if Age 46-80 years, 30 pack-year currently smoking OR have quit w/in 15years. Patient does not qualify. Breast:  Up to date on Mammogram? Yes   Up to date of Bone Density/Dexa? Yes Colorectal: up to date  Additional Screenings: : Hepatitis C Screening: n/a     Plan:    Patient wants to work on strengthening back. Encouraged her to check out you tube  videos that may have some exercises to try for that.   I have personally reviewed and noted the following in the patient's chart:   . Medical and social history . Use of alcohol, tobacco or illicit drugs  . Current medications and supplements . Functional ability and status . Nutritional status . Physical activity . Advanced directives . List of other physicians . Hospitalizations, surgeries, and ER visits in previous 12 months . Vitals . Screenings to include cognitive, depression, and falls . Referrals and appointments  In addition, I have reviewed and discussed with patient certain preventive protocols, quality metrics, and best practice recommendations. A written personalized care plan for preventive services as well as general preventive health recommendations were provided to patient.     Kellie Simmering, LPN  [phone]

## 2019-04-13 LAB — CMP14+EGFR
ALT: 14 IU/L (ref 0–32)
AST: 21 IU/L (ref 0–40)
Albumin/Globulin Ratio: 1.5 (ref 1.2–2.2)
Albumin: 4.6 g/dL (ref 3.6–4.6)
Alkaline Phosphatase: 83 IU/L (ref 39–117)
BUN/Creatinine Ratio: 12 (ref 12–28)
BUN: 10 mg/dL (ref 8–27)
Bilirubin Total: 0.5 mg/dL (ref 0.0–1.2)
CO2: 29 mmol/L (ref 20–29)
Calcium: 9.7 mg/dL (ref 8.7–10.3)
Chloride: 94 mmol/L — ABNORMAL LOW (ref 96–106)
Creatinine, Ser: 0.84 mg/dL (ref 0.57–1.00)
GFR calc Af Amer: 72 mL/min/{1.73_m2} (ref >59)
GFR calc non Af Amer: 63 mL/min/{1.73_m2} (ref >59)
Globulin, Total: 3 g/dL (ref 1.5–4.5)
Glucose: 94 mg/dL (ref 65–99)
Potassium: 3.7 mmol/L (ref 3.5–5.2)
Sodium: 135 mmol/L (ref 134–144)
Total Protein: 7.6 g/dL (ref 6.0–8.5)

## 2019-04-13 LAB — LIPID PANEL
Chol/HDL Ratio: 2.5 ratio (ref 0.0–4.4)
Cholesterol, Total: 146 mg/dL (ref 100–199)
HDL: 59 mg/dL (ref >39)
LDL Chol Calc (NIH): 69 mg/dL (ref 0–99)
Triglycerides: 97 mg/dL (ref 0–149)
VLDL Cholesterol Cal: 18 mg/dL (ref 5–40)

## 2019-04-13 LAB — HEMOGLOBIN A1C
Est. average glucose Bld gHb Est-mCnc: 128 mg/dL
Hgb A1c MFr Bld: 6.1 % — ABNORMAL HIGH (ref 4.8–5.6)

## 2019-04-13 LAB — MAGNESIUM: Magnesium: 2 mg/dL (ref 1.6–2.3)

## 2019-04-17 ENCOUNTER — Ambulatory Visit (INDEPENDENT_AMBULATORY_CARE_PROVIDER_SITE_OTHER): Payer: Medicare Other | Admitting: Pharmacist

## 2019-04-17 DIAGNOSIS — J4531 Mild persistent asthma with (acute) exacerbation: Secondary | ICD-10-CM

## 2019-04-17 DIAGNOSIS — I10 Essential (primary) hypertension: Secondary | ICD-10-CM | POA: Diagnosis not present

## 2019-04-17 NOTE — Patient Instructions (Signed)
Visit Information  Goals Addressed            This Visit's Progress     Patient Stated   . I would like to manage my asthma (pt-stated)       Current Barriers:  . Current asthma treatment regimen: Breo Ellipta . Denies current SOB/wheezing . Does not have rescue inhaler--will have called in . Patient currently on symptomatic control with flonase and anti-histamines . She is not a smoker . BP today 138/82, HR 72 . Currently manages anxiety with PRN alprazolam,  she has not been taking sertaline because it makes her feel different  Pharmacist Clinical Goal(s):  Marland Kitchen Over the next 90 days, patient with work with PharmD and primary care provider to address disease state management of asthma  Interventions: . Comprehensive medication review performed, medication list updated in electronic medical record . Counseled on appropriate use . Patient states she will consider patient assistance program, however she needs time to think about it  Patient Self Care Activities:  Patient will take medications as prescribed . Patient will contact provider with any episodes of SOB/wheezing . Patient will report any questions or concerns to provider   Initial goal documentation        The patient verbalized understanding of instructions provided today and declined a print copy of patient instruction materials.   The care management team will reach out to the patient again over the next 30 days.   SIGNATURE Regina Eck, PharmD, BCPS Clinical Pharmacist, [facility] [place]: [phone]

## 2019-04-19 ENCOUNTER — Telehealth: Payer: Self-pay

## 2019-04-20 ENCOUNTER — Telehealth: Payer: Self-pay

## 2019-04-20 NOTE — Telephone Encounter (Signed)
The pt was notified that her telmisartan to 20 mg has been approved for coverage by the pt's insurance company [employer].

## 2019-04-20 NOTE — Patient Instructions (Signed)
Visit Information  Goals Addressed            This Visit's Progress     Patient Stated   . My telmisartan is expensive (pt-stated)       Current Barriers:  . Uncontrolled hypertension, complicated by age . Current antihypertensive regimen: telmisartan, nebivilol, triam/HCTZ . Previous antihypertensives tried: n/a . Current home BP readings: 138/72 HR 68 . Financial: telmisartan is $47/month o Will submit tier exception with [employer] medicare to obtain ARB at a cheaper price  Pharmacist Clinical Goal(s):  Marland Kitchen Over the next 90 days, patient will work with PharmD and providers to optimize antihypertensive regimen  Interventions: . Comprehensive medication review performed; medication list updated in the electronic medical record.  . Counseled patient on taking medications as prescribed. . Counseled patient on diet/low salt  Patient Self Care Activities:  . Patient will continue to check BP 3 x weekly , document, and provide at future appointments . Patient will focus on medication adherence by continuing to take medications as prescribed  Initial goal documentation        The patient verbalized understanding of instructions provided today and declined a print copy of patient instruction materials.   The care management team will reach out to the patient again over the next 30 days.   SIGNATURE Regina Eck, PharmD, BCPS Clinical Pharmacist, [facility] [place]: [phone]

## 2019-04-20 NOTE — Progress Notes (Signed)
Chronic Care Management  Visit Note  04/17/2019 Name: MARICSA SPILLE MRN: [id number] DOB: Feb 20, 1930  Referred by: Glendale Chard, MD Reason for referral : Chronic Care Management   Paula Pollard is a 85 y.o. year old female who is a primary care patient of Glendale Chard, MD. The CCM team was consulted for assistance with chronic disease management and care coordination needs related to HTN and Asthma  Review of patient status, including review of consultants reports, relevant laboratory and other test results, and collaboration with appropriate care team members and the patient's provider was performed as part of comprehensive patient evaluation and provision of chronic care management services.    Medications: Outpatient Encounter Medications as of 04/17/2019  Medication Sig  . Ascorbic Acid (VITAMIN C PO) Take 1 tablet by mouth daily.   Marland Kitchen aspirin EC 81 MG tablet Take 81 mg by mouth daily.  Marland Kitchen atorvastatin (LIPITOR) 40 MG tablet Take 40 mg by mouth daily.  . Calcium 200 MG TABS Take 200 mg by mouth daily.  . cholecalciferol (VITAMIN D) 400 units TABS tablet Take 1,200 Units by mouth daily.  . Cyanocobalamin (B-12 PO) Take 1 tablet by mouth as needed (for energy).  Marland Kitchen esomeprazole (NEXIUM) 20 MG capsule Take 20 mg by mouth as needed (for heartburn).  . fluticasone (FLONASE) 50 MCG/ACT nasal spray Place 2 sprays into both nostrils daily as needed for allergies or rhinitis. (Patient taking differently: Place 1 spray into both nostrils daily as needed for allergies or rhinitis. )  . fluticasone furoate-vilanterol (BREO ELLIPTA) 200-25 MCG/INH AEPB Inhale 1 puff into the lungs daily.  Marland Kitchen LORazepam (ATIVAN) 0.5 MG tablet Take 1 tablet (0.5 mg total) by mouth every 12 (twelve) hours as needed for anxiety.  . Multiple Vitamins-Minerals (ECHINACEA ACZ PO) Take 1 capsule by mouth as needed (for immune support).  . nebivolol (BYSTOLIC) 5 MG tablet Take 1 tablet (5 mg total) by mouth daily.  Vladimir Faster Glycol-Propyl Glycol (SYSTANE) 0.4-0.3 % SOLN Apply 1 drop to eye 2 (two) times a day.   . sertraline (ZOLOFT) 25 MG tablet TAKE 1 TABLET BY MOUTH EVERY DAY  . telmisartan (MICARDIS) 20 MG tablet TAKE 1 TABLET BY MOUTH EVERY DAY  . triamcinolone cream (KENALOG) 0.1 % Apply 1 application topically 2 (two) times daily as needed.  . triamterene-hydrochlorothiazide (MAXZIDE-25) 37.5-25 MG tablet Take 1/2 tablet by mouth daily  . vitamin E 400 UNIT capsule Take 400 Units by mouth daily.  . [DISCONTINUED] Calcium Carbonate-Vitamin D (CALCIUM-VITAMIN D3 PO) Take 1 tablet by mouth every morning. 1200mg  of calcium   No facility-administered encounter medications on file as of 04/17/2019.      Objective:   Goals Addressed            This Visit's Progress     Patient Stated   . My telmisartan is expensive (pt-stated)       Current Barriers:  . Uncontrolled hypertension, complicated by age . Current antihypertensive regimen: telmisartan, nebivilol, triam/HCTZ . Previous antihypertensives tried: n/a . Current home BP readings: 138/72 HR 68 . Financial: telmisartan is $47/month o Will submit tier exception with silver [employer] medicare to obtain ARB at a cheaper price  Pharmacist Clinical Goal(s):  Marland Kitchen Over the next 90 days, patient will work with PharmD and providers to optimize antihypertensive regimen  Interventions: . Comprehensive medication review performed; medication list updated in the electronic medical record.  . Counseled patient on taking medications as prescribed. . Counseled patient on diet/low  salt  Patient Self Care Activities:  . Patient will continue to check BP 3 x weekly , document, and provide at future appointments . Patient will focus on medication adherence by continuing to take medications as prescribed  Initial goal documentation        Plan:   The care management team will reach out to the patient again over the next 30 days.   Provider  Signature Regina Eck, PharmD, BCPS Clinical Pharmacist, [facility] [place]: [phone]

## 2019-04-26 ENCOUNTER — Telehealth: Payer: Medicare Other | Admitting: Pharmacist

## 2019-04-28 ENCOUNTER — Ambulatory Visit: Payer: Self-pay

## 2019-04-28 DIAGNOSIS — J4531 Mild persistent asthma with (acute) exacerbation: Secondary | ICD-10-CM

## 2019-04-28 DIAGNOSIS — I10 Essential (primary) hypertension: Secondary | ICD-10-CM

## 2019-04-28 NOTE — Chronic Care Management (AMB) (Signed)
  Chronic Care Management   Outreach Note  04/28/2019 Name: Paula Pollard MRN: [id number] DOB: January 31, 1930  Referred by: Glendale Chard, MD Reason for referral : Care Coordination   Sw placed an unsuccessful outbound call to the patient in an attempt to conduct an SDOH (social determinants of health) screen. SW left a HIPAA compliant voice message requesting a return call.  Follow Up Plan: SW will attempt a second outreach over the next three weeks.  Daneen Schick, BSW, CDP Social Worker, Certified Dementia Practitioner [place] [facility] [phone]      ]

## 2019-05-02 ENCOUNTER — Telehealth: Payer: Self-pay

## 2019-05-09 ENCOUNTER — Telehealth: Payer: Self-pay

## 2019-05-17 ENCOUNTER — Ambulatory Visit: Payer: Medicare Other

## 2019-05-17 DIAGNOSIS — I10 Essential (primary) hypertension: Secondary | ICD-10-CM

## 2019-05-18 DIAGNOSIS — H25813 Combined forms of age-related cataract, bilateral: Secondary | ICD-10-CM | POA: Diagnosis not present

## 2019-05-18 DIAGNOSIS — H524 Presbyopia: Secondary | ICD-10-CM | POA: Diagnosis not present

## 2019-05-18 DIAGNOSIS — H52203 Unspecified astigmatism, bilateral: Secondary | ICD-10-CM | POA: Diagnosis not present

## 2019-05-18 NOTE — Chronic Care Management (AMB) (Signed)
  Chronic Care Management   Social Work General Note  05/17/2019 Name: Paula Pollard MRN: [id number] DOB: February 09, 1930  Paula Pollard is a 85 y.o. year old female who is a primary care patient of Glendale Chard, MD. The CCM was consulted to assist the patient with care coordination.   Review of patient status, including review of consultants reports, relevant laboratory and other test results, and collaboration with appropriate care team members and the patient's provider was performed as part of comprehensive patient evaluation and provision of chronic care management services.    SW placed an outbound call to the patient to introduce self and conduct an SDOH screen. The patient is active with CM program and was enrolled by PharmD for medication assistance.   SDOH (Social Determinants of Health) screening performed today. See Care Plan Entry related to challenges with: None  Outpatient Encounter Medications as of 05/17/2019  Medication Sig  . Ascorbic Acid (VITAMIN C PO) Take 1 tablet by mouth daily.   Marland Kitchen aspirin EC 81 MG tablet Take 81 mg by mouth daily.  Marland Kitchen atorvastatin (LIPITOR) 40 MG tablet Take 40 mg by mouth daily.  . Calcium 200 MG TABS Take 200 mg by mouth daily.  . cholecalciferol (VITAMIN D) 400 units TABS tablet Take 1,200 Units by mouth daily.  . Cyanocobalamin (B-12 PO) Take 1 tablet by mouth as needed (for energy).  Marland Kitchen esomeprazole (NEXIUM) 20 MG capsule Take 20 mg by mouth as needed (for heartburn).  . fluticasone (FLONASE) 50 MCG/ACT nasal spray Place 2 sprays into both nostrils daily as needed for allergies or rhinitis. (Patient taking differently: Place 1 spray into both nostrils daily as needed for allergies or rhinitis. )  . fluticasone furoate-vilanterol (BREO ELLIPTA) 200-25 MCG/INH AEPB Inhale 1 puff into the lungs daily.  Marland Kitchen LORazepam (ATIVAN) 0.5 MG tablet Take 1 tablet (0.5 mg total) by mouth every 12 (twelve) hours as needed for anxiety.  . Multiple  Vitamins-Minerals (ECHINACEA ACZ PO) Take 1 capsule by mouth as needed (for immune support).  . nebivolol (BYSTOLIC) 5 MG tablet Take 1 tablet (5 mg total) by mouth daily.  Vladimir Faster Glycol-Propyl Glycol (SYSTANE) 0.4-0.3 % SOLN Apply 1 drop to eye 2 (two) times a day.   . sertraline (ZOLOFT) 25 MG tablet TAKE 1 TABLET BY MOUTH EVERY DAY  . telmisartan (MICARDIS) 20 MG tablet TAKE 1 TABLET BY MOUTH EVERY DAY  . triamcinolone cream (KENALOG) 0.1 % Apply 1 application topically 2 (two) times daily as needed.  . triamterene-hydrochlorothiazide (MAXZIDE-25) 37.5-25 MG tablet Take 1/2 tablet by mouth daily  . vitamin E 400 UNIT capsule Take 400 Units by mouth daily.  . [DISCONTINUED] Calcium Carbonate-Vitamin D (CALCIUM-VITAMIN D3 PO) Take 1 tablet by mouth every morning. 1200mg  of calcium   No facility-administered encounter medications on file as of 05/17/2019.     Follow Up Plan: No SW follow up planned at this time. The patient will remain active with embedded PharmD.       Daneen Schick, BSW, CDP Social Worker, Certified Dementia Practitioner [place] [facility] [place] [facility] [phone]

## 2019-05-26 ENCOUNTER — Telehealth: Payer: Self-pay

## 2019-05-26 ENCOUNTER — Ambulatory Visit: Payer: Self-pay

## 2019-05-26 DIAGNOSIS — I10 Essential (primary) hypertension: Secondary | ICD-10-CM

## 2019-05-26 DIAGNOSIS — J4520 Mild intermittent asthma, uncomplicated: Secondary | ICD-10-CM

## 2019-05-26 NOTE — Chronic Care Management (AMB) (Signed)
  Chronic Care Management   Outreach Note  05/26/2019 Name: Paula Pollard MRN: [id number] DOB: 06-May-1930  Referred by: Glendale Chard, MD Reason for referral : Chronic Care Management (INITIAL CCM RNCM Outreach )   An unsuccessful telephone outreach was attempted today. The patient was referred to the case management team by Glendale Chard MD for assistance with care management and care coordination.   Follow Up Plan: Telephone follow up appointment with care management team member scheduled for: 05/30/19  Barb Merino, RN, BSN, CCM Care Management Coordinator [place] [facility]  Direct Phone: [phone]

## 2019-05-30 ENCOUNTER — Telehealth: Payer: Self-pay

## 2019-06-12 ENCOUNTER — Telehealth: Payer: Self-pay

## 2019-06-12 ENCOUNTER — Telehealth: Payer: Medicare Other | Admitting: Pharmacist

## 2019-06-19 ENCOUNTER — Ambulatory Visit: Payer: Self-pay | Admitting: Pharmacist

## 2019-06-19 DIAGNOSIS — I10 Essential (primary) hypertension: Secondary | ICD-10-CM

## 2019-06-19 NOTE — Progress Notes (Signed)
  Chronic Care Management   Outreach Note  06/19/2019 Name: Paula Pollard MRN: [id number] DOB: 01-20-1930  Referred by: Glendale Chard, MD Reason for referral : Chronic Care Management   An unsuccessful telephone outreach was attempted today. The patient was referred to the case management team by for assistance with care management and care coordination. Tier exception completed for ARB.  Will f/u with PCP.  Will submit to [employer] when complete.  Follow Up Plan: A HIPPA compliant phone message was left for the patient providing contact information and requesting a return call.  The care management team will reach out to the patient again over the next 7 days.   SIGNATURE Regina Eck, PharmD, BCPS Clinical Pharmacist, [facility] [place]: [phone]

## 2019-06-26 ENCOUNTER — Encounter: Payer: Self-pay | Admitting: Allergy and Immunology

## 2019-06-26 ENCOUNTER — Other Ambulatory Visit: Payer: Self-pay

## 2019-06-26 ENCOUNTER — Ambulatory Visit (INDEPENDENT_AMBULATORY_CARE_PROVIDER_SITE_OTHER): Payer: Medicare Other | Admitting: Allergy and Immunology

## 2019-06-26 VITALS — BP 140/78 | HR 97 | Temp 97.9°F | Resp 18 | Ht 61.0 in | Wt 179.0 lb

## 2019-06-26 DIAGNOSIS — J3089 Other allergic rhinitis: Secondary | ICD-10-CM | POA: Diagnosis not present

## 2019-06-26 DIAGNOSIS — R42 Dizziness and giddiness: Secondary | ICD-10-CM | POA: Diagnosis not present

## 2019-06-26 DIAGNOSIS — J4520 Mild intermittent asthma, uncomplicated: Secondary | ICD-10-CM | POA: Insufficient documentation

## 2019-06-26 MED ORDER — BREO ELLIPTA 200-25 MCG/INH IN AEPB: 1.0000 | INHALATION_SPRAY | Freq: Every day | RESPIRATORY_TRACT | 5 refills | Status: DC

## 2019-06-26 MED ORDER — AZELASTINE-FLUTICASONE 137-50 MCG/ACT NA SUSP: 1.0000 | Freq: Two times a day (BID) | NASAL | 5 refills | Status: DC | PRN

## 2019-06-26 MED ORDER — ALBUTEROL SULFATE HFA 108 (90 BASE) MCG/ACT IN AERS: 2.0000 | INHALATION_SPRAY | Freq: Four times a day (QID) | RESPIRATORY_TRACT | 1 refills | Status: DC | PRN

## 2019-06-26 NOTE — Patient Instructions (Addendum)
Mild intermittent asthma  Continue albuterol HFA, 1 to 2 inhalations every 4-6 hours if needed.  During respiratory tract infections and/or asthma flares, add Breo Ellipta 200-25 g, 1 inhalation daily until symptoms have returned to baseline.  Subjective and objective measures of pulmonary function will be followed and the treatment plan will be adjusted accordingly.  Other allergic rhinitis Continue appropriate allergen avoidance measures.  A prescription has been provided for azelastine/fluticasone nasal spray, 1 spray per nostril twice daily as needed. Proper nasal spray technique has been discussed and demonstrated.  Nasal saline spray (i.e., Simply Saline) or nasal saline lavage (i.e., NeilMed) is recommended as needed and prior to medicated nasal sprays.  For dry nasal mucosa use nasal saline gel rather than Vaseline petrolatum in order to avoid chemical pneumonitis.  Vertigo Improved.  If this problem recurs, follow-up with Dr. Benjamine Mola.   Return in about 5 months (around 11/24/2019), or if symptoms worsen or fail to improve.

## 2019-06-26 NOTE — Assessment & Plan Note (Signed)
Continue appropriate allergen avoidance measures.  A prescription has been provided for azelastine/fluticasone nasal spray, 1 spray per nostril twice daily as needed. Proper nasal spray technique has been discussed and demonstrated.  Nasal saline spray (i.e., Simply Saline) or nasal saline lavage (i.e., NeilMed) is recommended as needed and prior to medicated nasal sprays.  For dry nasal mucosa use nasal saline gel rather than Vaseline petrolatum in order to avoid chemical pneumonitis.

## 2019-06-26 NOTE — Assessment & Plan Note (Signed)
Improved.  If this problem recurs, follow-up with Dr. Benjamine Mola.

## 2019-06-26 NOTE — Progress Notes (Signed)
Follow-up Note  RE: [name] MRN: [id number] DOB: 28-Sep-1929 Date of Office Visit: 06/26/2019  Primary care provider: Glendale Chard, MD Referring provider: Glendale Chard, MD  History of present illness: Paula Pollard is a 86 y.o. female with asthma and allergic rhinoconjunctivitis presenting today for sick visit.  She was last seen in this clinic in October 2019.  She reports that she had a 3-day hospitalization for vertigo in May 2020.  She reports that the vertigo "flared up" again in September 2020.  She states that she has been evaluated and treated successfully by Dr. Benjamine Mola.  She reports that she had a sinus infection in September 2020 which triggered an asthma exacerbation.  She reports that her asthma is only exacerbated by respiratory tract infections and, during those times, she uses Breo Ellipta 200-25 g for a week or so as burst therapy.  Over the past few months she has not required asthma rescue medication, experienced nocturnal awakenings due to lower respiratory symptoms, nor have activities of daily living been limited.  She has been used fluticasone nasal spray but it was too expensive and she continues to experience occasional nasal/sinus symptoms.  She has been using Vaseline jelly in her nose for dry nasal mucosa.  She uses eye lubrication drops for dry eyes.  Assessment and plan: Mild intermittent asthma  Continue albuterol HFA, 1 to 2 inhalations every 4-6 hours if needed.  During respiratory tract infections and/or asthma flares, add Breo Ellipta 200-25 g, 1 inhalation daily until symptoms have returned to baseline.  Subjective and objective measures of pulmonary function will be followed and the treatment plan will be adjusted accordingly.  Other allergic rhinitis Continue appropriate allergen avoidance measures.  A prescription has been provided for azelastine/fluticasone nasal spray, 1 spray per nostril twice daily as needed. Proper nasal spray  technique has been discussed and demonstrated.  Nasal saline spray (i.e., Simply Saline) or nasal saline lavage (i.e., NeilMed) is recommended as needed and prior to medicated nasal sprays.  For dry nasal mucosa use nasal saline gel rather than Vaseline petrolatum in order to avoid chemical pneumonitis.  Vertigo Improved.  If this problem recurs, follow-up with Dr. Benjamine Mola.   Meds ordered this encounter  Medications  . fluticasone furoate-vilanterol (BREO ELLIPTA) 200-25 MCG/INH AEPB    Sig: Inhale 1 puff into the lungs daily.    Dispense:  1 each    Refill:  5  . albuterol (VENTOLIN HFA) 108 (90 Base) MCG/ACT inhaler    Sig: Inhale 2 puffs into the lungs every 6 (six) hours as needed for wheezing or shortness of breath.    Dispense:  18 g    Refill:  1  . Azelastine-Fluticasone (DYMISTA) 137-50 MCG/ACT SUSP    Sig: Place 1 spray into both nostrils 2 (two) times daily as needed.    Dispense:  23 g    Refill:  5    Diagnostics: Spirometry:  Normal with an FEV1 of 102% predicted. This study was performed while the patient was asymptomatic.  Please see scanned spirometry results for details.    Physical examination: Blood pressure 140/78, pulse 97, temperature 97.9 F (36.6 C), temperature source Temporal, resp. rate 18, height 5\' 1"  (1.549 m), weight 179 lb (81.2 kg), SpO2 97 %.  General: Alert, interactive, in no acute distress. HEENT: TMs pearly gray, turbinates mildly edematous without discharge, post-pharynx erythematous. Neck: Supple without lymphadenopathy. Lungs: Clear to auscultation without wheezing, rhonchi or rales. CV: Normal S1, S2 without murmurs.  Skin: Warm and dry, without lesions or rashes.  The following portions of the patient's history were reviewed and updated as appropriate: allergies, current medications, past family history, past medical history, past social history, past surgical history and problem list.  Current Outpatient Medications  Medication  Sig Dispense Refill  . Ascorbic Acid (VITAMIN C PO) Take 1 tablet by mouth daily.     Marland Kitchen aspirin EC 81 MG tablet Take 81 mg by mouth daily.    Marland Kitchen atorvastatin (LIPITOR) 40 MG tablet Take 40 mg by mouth daily.    . Calcium 200 MG TABS Take 200 mg by mouth daily.    . cholecalciferol (VITAMIN D) 400 units TABS tablet Take 1,200 Units by mouth daily.    . Cyanocobalamin (B-12 PO) Take 1 tablet by mouth as needed (for energy).    Marland Kitchen esomeprazole (NEXIUM) 20 MG capsule Take 20 mg by mouth as needed (for heartburn).    . fluticasone (FLONASE) 50 MCG/ACT nasal spray Place 2 sprays into both nostrils daily as needed for allergies or rhinitis. (Patient taking differently: Place 1 spray into both nostrils daily as needed for allergies or rhinitis. ) 18.2 g 5  . fluticasone furoate-vilanterol (BREO ELLIPTA) 200-25 MCG/INH AEPB Inhale 1 puff into the lungs daily. 1 each 5  . LORazepam (ATIVAN) 0.5 MG tablet Take 1 tablet (0.5 mg total) by mouth every 12 (twelve) hours as needed for anxiety. 30 tablet 0  . Multiple Vitamins-Minerals (ECHINACEA ACZ PO) Take 1 capsule by mouth as needed (for immune support).    . nebivolol (BYSTOLIC) 5 MG tablet Take 1 tablet (5 mg total) by mouth daily. 30 tablet 0  . Polyethyl Glycol-Propyl Glycol (SYSTANE) 0.4-0.3 % SOLN Apply 1 drop to eye 2 (two) times a day.     . sertraline (ZOLOFT) 25 MG tablet TAKE 1 TABLET BY MOUTH EVERY DAY 90 tablet 1  . telmisartan (MICARDIS) 20 MG tablet TAKE 1 TABLET BY MOUTH EVERY DAY 90 tablet 1  . triamterene-hydrochlorothiazide (MAXZIDE-25) 37.5-25 MG tablet Take 1/2 tablet by mouth daily 15 tablet 3  . vitamin E 400 UNIT capsule Take 400 Units by mouth daily.    Marland Kitchen albuterol (VENTOLIN HFA) 108 (90 Base) MCG/ACT inhaler Inhale 2 puffs into the lungs every 6 (six) hours as needed for wheezing or shortness of breath. 18 g 1  . Azelastine-Fluticasone (DYMISTA) 137-50 MCG/ACT SUSP Place 1 spray into both nostrils 2 (two) times daily as needed. 23 g 5    . triamcinolone cream (KENALOG) 0.1 % Apply 1 application topically 2 (two) times daily as needed. (Patient not taking: Reported on 06/26/2019) 30 g 1   No current facility-administered medications for this visit.    Allergies  Allergen Reactions  . Codeine Nausea Only and Other (See Comments)    Reaction:Dizziness and hallucinations "makes me climb walls"   Review of systems: Review of systems negative except as noted in HPI / PMHx.  Past Medical History:  Diagnosis Date  . Anemia    "in past as a young girl"  . Anxiety   . Arthritis   . Asthma   . Bronchitis    hx of  . Cataract    bilaterally, "no surgery at this time"  . Diabetes mellitus without complication ([place])    pre diabetic  . Dysrhythmia    when gets excited  . GERD (gastroesophageal reflux disease)   . Headache(784.0)    hx of migraines  . History of hiatal hernia   .  Hyperlipidemia   . Hypertension    sees Dr. Terrence Dupont  . Neuromuscular disorder (HCC)    hx of carpal tunnel "never had surgery for"right hand    Family History  Problem Relation Age of Onset  . Heart attack Mother   . Heart attack Father   . Stroke Father   . Stroke Daughter   . Colon cancer Sister     Social History   Socioeconomic History  . Marital status: Divorced    Spouse name: Not on file  . Number of children: Not on file  . Years of education: Not on file  . Highest education level: Not on file  Occupational History  . Occupation: retired  Tobacco Use  . Smoking status: Never Smoker  . Smokeless tobacco: Never Used  Substance and Sexual Activity  . Alcohol use: No  . Drug use: No  . Sexual activity: Not Currently  Other Topics Concern  . Not on file  Social History Narrative  . Not on file   Social Determinants of Health   Financial Resource Strain:   . Difficulty of Paying Living Expenses: Not on file  Food Insecurity:   . Worried About Charity fundraiser in the Last Year: Not on file  . Ran Out of Food  in the Last Year: Not on file  Transportation Needs:   . Lack of Transportation (Medical): Not on file  . Lack of Transportation (Non-Medical): Not on file  Physical Activity:   . Days of Exercise per Week: Not on file  . Minutes of Exercise per Session: Not on file  Stress:   . Feeling of Stress : Not on file  Social Connections:   . Frequency of Communication with Friends and Family: Not on file  . Frequency of Social Gatherings with Friends and Family: Not on file  . Attends Religious Services: Not on file  . Active Member of Clubs or Organizations: Not on file  . Attends Archivist Meetings: Not on file  . Marital Status: Not on file  Intimate Partner Violence:   . Fear of Current or Ex-Partner: Not on file  . Emotionally Abused: Not on file  . Physically Abused: Not on file  . Sexually Abused: Not on file    I appreciate the opportunity to take part in Brilyn's care. Please do not hesitate to contact me with questions.  Sincerely,   R. Edgar Frisk, MD

## 2019-06-26 NOTE — Assessment & Plan Note (Signed)
   Continue albuterol HFA, 1 to 2 inhalations every 4-6 hours if needed.  During respiratory tract infections and/or asthma flares, add Breo Ellipta 200-25 g, 1 inhalation daily until symptoms have returned to baseline.  Subjective and objective measures of pulmonary function will be followed and the treatment plan will be adjusted accordingly.

## 2019-06-27 ENCOUNTER — Telehealth: Payer: Self-pay

## 2019-06-27 NOTE — Telephone Encounter (Signed)
Pt should call next month and let us know the cost of her telmisartan.

## 2019-07-10 DIAGNOSIS — I2510 Atherosclerotic heart disease of native coronary artery without angina pectoris: Secondary | ICD-10-CM | POA: Diagnosis not present

## 2019-07-10 DIAGNOSIS — J45909 Unspecified asthma, uncomplicated: Secondary | ICD-10-CM | POA: Diagnosis not present

## 2019-07-10 DIAGNOSIS — M1990 Unspecified osteoarthritis, unspecified site: Secondary | ICD-10-CM | POA: Diagnosis not present

## 2019-07-10 DIAGNOSIS — I10 Essential (primary) hypertension: Secondary | ICD-10-CM | POA: Diagnosis not present

## 2019-07-10 DIAGNOSIS — E785 Hyperlipidemia, unspecified: Secondary | ICD-10-CM | POA: Diagnosis not present

## 2019-07-21 ENCOUNTER — Other Ambulatory Visit: Payer: Self-pay | Admitting: Internal Medicine

## 2019-07-23 DIAGNOSIS — Z23 Encounter for immunization: Secondary | ICD-10-CM | POA: Diagnosis not present

## 2019-08-03 DIAGNOSIS — J343 Hypertrophy of nasal turbinates: Secondary | ICD-10-CM | POA: Diagnosis not present

## 2019-08-03 DIAGNOSIS — J342 Deviated nasal septum: Secondary | ICD-10-CM | POA: Diagnosis not present

## 2019-08-03 DIAGNOSIS — J310 Chronic rhinitis: Secondary | ICD-10-CM | POA: Diagnosis not present

## 2019-08-04 ENCOUNTER — Telehealth: Payer: Self-pay

## 2019-08-10 ENCOUNTER — Ambulatory Visit (INDEPENDENT_AMBULATORY_CARE_PROVIDER_SITE_OTHER): Payer: Medicare Other | Admitting: Internal Medicine

## 2019-08-10 ENCOUNTER — Encounter: Payer: Self-pay | Admitting: Internal Medicine

## 2019-08-10 ENCOUNTER — Other Ambulatory Visit: Payer: Self-pay

## 2019-08-10 VITALS — BP 120/84 | HR 85 | Temp 98.2°F | Ht 61.0 in | Wt 179.0 lb

## 2019-08-10 DIAGNOSIS — I129 Hypertensive chronic kidney disease with stage 1 through stage 4 chronic kidney disease, or unspecified chronic kidney disease: Secondary | ICD-10-CM

## 2019-08-10 DIAGNOSIS — N182 Chronic kidney disease, stage 2 (mild): Secondary | ICD-10-CM

## 2019-08-10 DIAGNOSIS — Z6833 Body mass index (BMI) 33.0-33.9, adult: Secondary | ICD-10-CM | POA: Diagnosis not present

## 2019-08-10 DIAGNOSIS — I83812 Varicose veins of left lower extremities with pain: Secondary | ICD-10-CM

## 2019-08-10 DIAGNOSIS — R7309 Other abnormal glucose: Secondary | ICD-10-CM

## 2019-08-10 DIAGNOSIS — E6609 Other obesity due to excess calories: Secondary | ICD-10-CM | POA: Diagnosis not present

## 2019-08-10 NOTE — Patient Instructions (Addendum)
PLEASE CALL DR. Japhet Morgenthaler WHEN YOU ARE ABOUT TO RUN OUT OF TRIAMTERENE/HCTZ  WHEN  YOU RUN OUT, I WANT YOU TO START HYDROCHLOROTHIAZIDE (HCTZ) 12.5MG   START ATORVASTATIN - ONLY TAKE M-F (SKIP Saturday AND Sunday).   Varicose Veins Varicose veins are veins that have become enlarged, bulged, and twisted. They most often appear in the legs. What are the causes? This condition is caused by damage to the valves in the vein. These valves help blood return to your heart. When they are damaged and they stop working properly, blood may flow backward and back up in the veins near the skin, causing the veins to get larger and appear twisted. The condition can result from any issue that causes blood to back up, like pregnancy, prolonged standing, or obesity. What increases the risk? This condition is more likely to develop in people who are:  On their feet a lot.  Pregnant.  Overweight. What are the signs or symptoms? Symptoms of this condition include:  Bulging, twisted, and bluish veins.  A feeling of heaviness. This may be worse at the end of the day.  Leg pain. This may be worse at the end of the day.  Swelling in the leg.  Changes in skin color over the veins. How is this diagnosed? This condition may be diagnosed based on your symptoms, a physical exam, and an ultrasound test. How is this treated? Treatment for this condition may involve:  Avoiding sitting or standing in one position for long periods of time.  Wearing compression stockings. These stockings help to prevent blood clots and reduce swelling in the legs.  Raising (elevating) the legs when resting.  Losing weight.  Exercising regularly. If you have persistent symptoms or want to improve the way your varicose veins look, you may choose to have a procedure to close the varicose veins off or to remove them. Treatments to close off the veins include:  Sclerotherapy. In this treatment, a solution is injected into a vein  to close it off.  Laser treatment. In this treatment, the vein is heated with a laser to close it off.  Radiofrequency vein ablation. In this treatment, an electrical current produced by radio waves is used to close off the vein. Treatments to remove the veins include:  Phlebectomy. In this treatment, the veins are removed through small incisions made over the veins.  Vein ligation and stripping. In this treatment, incisions are made over the veins. The veins are then removed after being tied (ligated) with stitches (sutures). Follow these instructions at home: Activity  Walk as much as possible. Walking increases blood flow. This helps blood return to the heart and takes pressure off your veins. It also increases your cardiovascular strength.  Follow your health care provider's instructions about exercising.  Do not stand or sit in one position for a long period of time.  Do not sit with your legs crossed.  Rest with your legs raised during the day. General instructions   Follow any diet instructions given to you by your health care provider.  Wear compression stockings as directed by your health care provider. Do not wear other kinds of tight clothing around your legs, pelvis, or waist.  Elevate your legs at night to above the level of your heart.  If you get a cut in the skin over the varicose vein and the vein bleeds: ? Lie down with your leg raised. ? Apply firm pressure to the cut with a clean cloth until the bleeding  stops. ? Place a bandage (dressing) on the cut. Contact a health care provider if:  The skin around your varicose veins starts to break down.  You have pain, redness, tenderness, or hard swelling over a vein.  You are uncomfortable because of pain.  You get a cut in the skin over a varicose vein and it will not stop bleeding. Summary  Varicose veins are veins that have become enlarged, bulged, and twisted. They most often appear in the legs.  This  condition is caused by damage to the valves in the vein. These valves help blood return to your heart.  Treatment for this condition includes frequent movements, wearing compression stockings, losing weight, and exercising regularly. In some cases, procedures are done to close off or remove the veins.  Treatment for this condition may include wearing compression stockings, elevating the legs, losing weight, and engaging in regular activity. In some cases, procedures are done to close off or remove the veins. This information is not intended to replace advice given to you by your health care provider. Make sure you discuss any questions you have with your health care provider. Document Revised: 07/21/2018 Document Reviewed: 06/17/2016 Elsevier Patient Education  [place].

## 2019-08-11 LAB — CBC
Hematocrit: 40.3 % (ref 34.0–46.6)
Hemoglobin: 13.2 g/dL (ref 11.1–15.9)
MCH: 29.9 pg (ref 26.6–33.0)
MCHC: 32.8 g/dL (ref 31.5–35.7)
MCV: 91 fL (ref 79–97)
Platelets: 240 10*3/uL (ref 150–450)
RBC: 4.41 x10E6/uL (ref 3.77–5.28)
RDW: 12.1 % (ref 11.7–15.4)
WBC: 4.5 10*3/uL (ref 3.4–10.8)

## 2019-08-11 LAB — BMP8+EGFR
BUN/Creatinine Ratio: 18 (ref 12–28)
BUN: 15 mg/dL (ref 8–27)
CO2: 27 mmol/L (ref 20–29)
Calcium: 9.4 mg/dL (ref 8.7–10.3)
Chloride: 101 mmol/L (ref 96–106)
Creatinine, Ser: 0.84 mg/dL (ref 0.57–1.00)
GFR calc Af Amer: 72 mL/min/{1.73_m2} (ref >59)
GFR calc non Af Amer: 63 mL/min/{1.73_m2} (ref >59)
Glucose: 83 mg/dL (ref 65–99)
Potassium: 3.8 mmol/L (ref 3.5–5.2)
Sodium: 140 mmol/L (ref 134–144)

## 2019-08-11 LAB — HEMOGLOBIN A1C
Est. average glucose Bld gHb Est-mCnc: 123 mg/dL
Hgb A1c MFr Bld: 5.9 % — ABNORMAL HIGH (ref 4.8–5.6)

## 2019-08-12 NOTE — Progress Notes (Signed)
This visit occurred during the SARS-CoV-2 public health emergency.  Safety protocols were in place, including screening questions prior to the visit, additional usage of staff PPE, and extensive cleaning of exam room while observing appropriate contact time as indicated for disinfecting solutions.  Subjective:     Patient ID: Paula Pollard , female    DOB: 01-01-1930 , 86 y.o.   MRN: [id number]   Chief Complaint  Patient presents with  . Hypertension  . Leg Pain    HPI  She is here today for BP check. She reports compliance with meds. Unfortunately, her sister passed since her last visit. She feels okay. She is at peace knowing her sister is no longer suffering.   Hypertension This is a chronic problem. The current episode started more than 1 year ago. The problem has been gradually improving since onset. The problem is controlled. Pertinent negatives include no blurred vision, chest pain, palpitations or shortness of breath. Risk factors for coronary artery disease include sedentary lifestyle, obesity, stress and post-menopausal state. There are no compliance problems.  Hypertensive end-organ damage includes kidney disease.     Past Medical History:  Diagnosis Date  . Anemia    "in past as a young girl"  . Anxiety   . Arthritis   . Asthma   . Bronchitis    hx of  . Cataract    bilaterally, "no surgery at this time"  . Diabetes mellitus without complication ([place])    pre diabetic  . Dysrhythmia    when gets excited  . GERD (gastroesophageal reflux disease)   . Headache(784.0)    hx of migraines  . History of hiatal hernia   . Hyperlipidemia   . Hypertension    sees Dr. Terrence Dupont  . Neuromuscular disorder (HCC)    hx of carpal tunnel "never had surgery for"right hand     Family History  Problem Relation Age of Onset  . Heart attack Mother   . Heart attack Father   . Stroke Father   . Stroke Daughter   . Colon cancer Sister      Current Outpatient Medications:   .  Ascorbic Acid (VITAMIN C PO), Take 1 tablet by mouth daily. , Disp: , Rfl:  .  aspirin EC 81 MG tablet, Take 81 mg by mouth daily., Disp: , Rfl:  .  atorvastatin (LIPITOR) 40 MG tablet, Take 40 mg by mouth daily., Disp: , Rfl:  .  Azelastine-Fluticasone (DYMISTA) 137-50 MCG/ACT SUSP, Place 1 spray into both nostrils 2 (two) times daily as needed., Disp: 23 g, Rfl: 5 .  Calcium 200 MG TABS, Take 200 mg by mouth daily., Disp: , Rfl:  .  cholecalciferol (VITAMIN D) 400 units TABS tablet, Take 1,200 Units by mouth daily., Disp: , Rfl:  .  Cyanocobalamin (B-12 PO), Take 1 tablet by mouth as needed (for energy)., Disp: , Rfl:  .  esomeprazole (NEXIUM) 20 MG capsule, Take 20 mg by mouth as needed (for heartburn)., Disp: , Rfl:  .  fluticasone (FLONASE) 50 MCG/ACT nasal spray, Place 2 sprays into both nostrils daily as needed for allergies or rhinitis. (Patient taking differently: Place 1 spray into both nostrils daily as needed for allergies or rhinitis. ), Disp: 18.2 g, Rfl: 5 .  fluticasone furoate-vilanterol (BREO ELLIPTA) 200-25 MCG/INH AEPB, Inhale 1 puff into the lungs daily., Disp: 1 each, Rfl: 5 .  LORazepam (ATIVAN) 0.5 MG tablet, Take 1 tablet (0.5 mg total) by mouth every 12 (twelve) hours as  needed for anxiety., Disp: 30 tablet, Rfl: 0 .  Multiple Vitamins-Minerals (ECHINACEA ACZ PO), Take 1 capsule by mouth as needed (for immune support)., Disp: , Rfl:  .  nebivolol (BYSTOLIC) 5 MG tablet, Take 1 tablet (5 mg total) by mouth daily., Disp: 30 tablet, Rfl: 0 .  Polyethyl Glycol-Propyl Glycol (SYSTANE) 0.4-0.3 % SOLN, Apply 1 drop to eye 2 (two) times a day. , Disp: , Rfl:  .  telmisartan (MICARDIS) 20 MG tablet, TAKE 1 TABLET BY MOUTH EVERY DAY, Disp: 90 tablet, Rfl: 1 .  triamcinolone cream (KENALOG) 0.1 %, Apply 1 application topically 2 (two) times daily as needed., Disp: 30 g, Rfl: 1 .  triamterene-hydrochlorothiazide (MAXZIDE-25) 37.5-25 MG tablet, Take 1/2 tablet by mouth daily, Disp:  15 tablet, Rfl: 3 .  vitamin E 400 UNIT capsule, Take 400 Units by mouth daily., Disp: , Rfl:  .  albuterol (VENTOLIN HFA) 108 (90 Base) MCG/ACT inhaler, Inhale 2 puffs into the lungs every 6 (six) hours as needed for wheezing or shortness of breath. (Patient not taking: Reported on 08/10/2019), Disp: 18 g, Rfl: 1 .  sertraline (ZOLOFT) 25 MG tablet, TAKE 1 TABLET BY MOUTH EVERY DAY (Patient not taking: Reported on 08/10/2019), Disp: 90 tablet, Rfl: 1   Allergies  Allergen Reactions  . Codeine Nausea Only and Other (See Comments)    Reaction:Dizziness and hallucinations "makes me climb walls"     Review of Systems  Constitutional: Negative.   Eyes: Negative for blurred vision.  Respiratory: Negative.  Negative for shortness of breath.   Cardiovascular: Negative.  Negative for chest pain and palpitations.       She c/o painful varicose veins. She reports her legs ache at night. Left leg hurts the most. There is no pain with ambulation.   Gastrointestinal: Negative.   Neurological: Negative.   Psychiatric/Behavioral: Negative.      Today's Vitals   08/10/19 1045  BP: 120/84  Pulse: 85  Temp: 98.2 F (36.8 C)  Weight: 179 lb (81.2 kg)  Height: _0  (1.549 m)  PainSc: 0-No pain   Body mass index is 33.82 kg/m.   Objective:  Physical Exam Vitals and nursing note reviewed.  Constitutional:      Appearance: Normal appearance.  HENT:     Head: Normocephalic and atraumatic.  Cardiovascular:     Rate and Rhythm: Normal rate and regular rhythm.     Heart sounds: Normal heart sounds.     Comments: She refused to remove pantyhose, so I am unable to visualize the varicosities, but I can palpate Pulmonary:     Effort: Pulmonary effort is normal.     Breath sounds: Normal breath sounds.  Musculoskeletal:     Right lower leg: 1+ Pitting Edema present.     Left lower leg: 1+ Pitting Edema present.  Skin:    General: Skin is warm.  Neurological:     General: No focal deficit present.      Mental Status: She is alert.  Psychiatric:        Mood and Affect: Mood normal.        Behavior: Behavior normal.         Assessment And Plan:     1. Hypertensive nephropathy  Chronic, well controlled. She will continue with current meds. She is encouraged to avoid adding salt to her foods.   - BMP8+EGFR - CBC no Diff  2. Chronic renal disease, stage II  Chronic, yet stable. Importance of adequate hydration and  optimal BP control to prevent progression of CKD was discussed with the patient.   3. Varicose veins of left lower extremity with pain  I will refer her to vein specialist as requested.   - Ambulatory referral to Vascular Surgery  4. Other abnormal glucose  HER A1C HAS BEEN ELEVATED IN THE PAST. I WILL CHECK AN A1C, BMET TODAY. SHE WAS ENCOURAGED TO AVOID SUGARY BEVERAGES AND PROCESSED FOODS INCLUDNG BREADS, RICE AND PASTA.  - Hemoglobin A1c  5. Class 1 obesity due to excess calories with serious comorbidity and body mass index (BMI) of 33.0 to 33.9 in adult  She is encouraged to strive for BMI less than 30 to decrease cardiac risk. She is encouraged to increase daily activity as tolerated.   [name], MD    THE PATIENT IS ENCOURAGED TO PRACTICE SOCIAL DISTANCING DUE TO THE COVID-19 PANDEMIC.

## 2019-08-21 DIAGNOSIS — Z23 Encounter for immunization: Secondary | ICD-10-CM | POA: Diagnosis not present

## 2019-09-01 ENCOUNTER — Ambulatory Visit: Payer: Self-pay

## 2019-09-01 ENCOUNTER — Other Ambulatory Visit: Payer: Self-pay

## 2019-09-01 ENCOUNTER — Telehealth: Payer: Medicare Other

## 2019-09-01 DIAGNOSIS — I10 Essential (primary) hypertension: Secondary | ICD-10-CM

## 2019-09-01 DIAGNOSIS — J4531 Mild persistent asthma with (acute) exacerbation: Secondary | ICD-10-CM

## 2019-09-04 NOTE — Chronic Care Management (AMB) (Signed)
  Chronic Care Management   Outreach Note  09/04/2019 Name: Paula Pollard MRN: [id number] DOB: 03/01/1930  Referred by: Glendale Chard, MD Reason for referral : Chronic Care Management (RQ #2 Initial RN Call )   A second unsuccessful telephone outreach was attempted today. The patient was referred to the case management team for assistance with care management and care coordination.   Follow Up Plan: Telephone follow up appointment with care management team member scheduled for: 09/20/19  Barb Merino, RN, BSN, CCM Care Management Coordinator [facility]  Direct Phone: [phone]

## 2019-09-14 ENCOUNTER — Other Ambulatory Visit: Payer: Self-pay | Admitting: *Deleted

## 2019-09-14 DIAGNOSIS — I83893 Varicose veins of bilateral lower extremities with other complications: Secondary | ICD-10-CM

## 2019-09-15 ENCOUNTER — Telehealth (HOSPITAL_COMMUNITY): Payer: Self-pay

## 2019-09-15 NOTE — Telephone Encounter (Signed)

## 2019-09-19 ENCOUNTER — Ambulatory Visit (HOSPITAL_COMMUNITY)
Admission: RE | Admit: 2019-09-19 | Discharge: 2019-09-19 | Disposition: A | Discharge status: Home / Self Care | Payer: Medicare Other | Source: Ambulatory Visit | Attending: Surgery | Admitting: Surgery

## 2019-09-19 ENCOUNTER — Encounter: Payer: Self-pay | Admitting: Vascular Surgery

## 2019-09-19 ENCOUNTER — Ambulatory Visit (INDEPENDENT_AMBULATORY_CARE_PROVIDER_SITE_OTHER): Payer: Medicare Other | Admitting: Physician Assistant

## 2019-09-19 ENCOUNTER — Other Ambulatory Visit: Payer: Self-pay

## 2019-09-19 VITALS — BP 143/82 | HR 79 | Temp 97.6°F | Resp 16 | Ht 61.0 in | Wt 178.9 lb

## 2019-09-19 DIAGNOSIS — I83893 Varicose veins of bilateral lower extremities with other complications: Secondary | ICD-10-CM

## 2019-09-19 DIAGNOSIS — I872 Venous insufficiency (chronic) (peripheral): Secondary | ICD-10-CM

## 2019-09-19 NOTE — Progress Notes (Signed)
[facility] [place]   Reason for referral: She c/o painful varicose veins. She reports her legs ache at night. Left leg hurts the most. There is no pain with ambulation.    History of Present Illness  Paula Pollard is a 86 y.o. female who presents with chief complaint: swollen legs left > right with broken veins surrounding her ankle.  Patient notes, onset of swelling > 20 years ago, associated with prolonged sitting and standing.  The patient has had no history of DVT, + history of varicose vein, no history of venous stasis ulcers, no history of  Lymphedema and + history of skin changes in lower legs.  There is a family history of venous disorders.  The patient has not used compression stockings in the past.  Past Medical History:  Diagnosis Date  . Anemia    "in past as a young girl"  . Anxiety   . Arthritis   . Asthma   . Bronchitis    hx of  . Cataract    bilaterally, "no surgery at this time"  . Diabetes mellitus without complication ([place])    pre diabetic  . Dysrhythmia    when gets excited  . GERD (gastroesophageal reflux disease)   . Headache(784.0)    hx of migraines  . History of hiatal hernia   . Hyperlipidemia   . Hypertension    sees Dr. Terrence Dupont  . Neuromuscular disorder ([place])    hx of carpal tunnel "never had surgery for"right hand    Past Surgical History:  Procedure Laterality Date  . CARDIAC CATHETERIZATION     5 years ago no problems  . COLONOSCOPY  07/2018   polypectomy x 5  . DILATATION & CURETTAGE/HYSTEROSCOPY WITH MYOSURE N/A 01/06/2018   Procedure: DILATATION & CURETTAGE/HYSTEROSCOPY WITH MYOSURE;  Surgeon: Servando Salina, MD;  Location: [facility];  Service: Gynecology;  Laterality: N/A;  . DOPPLER ECHOCARDIOGRAPHY     hx of  . NECK SURGERY     2002, cyst removed,   . removal of toe nails     both feet  . TOTAL HIP ARTHROPLASTY Right 08/08/2012   Dr Mayer Camel  . TOTAL HIP ARTHROPLASTY Right 08/08/2012   Procedure: TOTAL HIP  ARTHROPLASTY;  Surgeon: Kerin Salen, MD;  Location: [facility];  Service: Orthopedics;  Laterality: Right;  . TUBAL LIGATION     1970    Social History   Socioeconomic History  . Marital status: Divorced    Spouse name: Not on file  . Number of children: Not on file  . Years of education: Not on file  . Highest education level: Not on file  Occupational History  . Occupation: retired  Tobacco Use  . Smoking status: Never Smoker  . Smokeless tobacco: Never Used  Substance and Sexual Activity  . Alcohol use: No  . Drug use: No  . Sexual activity: Not Currently  Other Topics Concern  . Not on file  Social History Narrative  . Not on file   Social Determinants of Health   Financial Resource Strain:   . Difficulty of Paying Living Expenses:   Food Insecurity:   . Worried About Charity fundraiser in the Last Year:   . Arboriculturist in the Last Year:   Transportation Needs:   . Film/video editor (Medical):   Marland Kitchen Lack of Transportation (Non-Medical):   Physical Activity:   . Days of Exercise per Week:   . Minutes of Exercise  per Session:   Stress:   . Feeling of Stress :   Social Connections:   . Frequency of Communication with Friends and Family:   . Frequency of Social Gatherings with Friends and Family:   . Attends Religious Services:   . Active Member of Clubs or Organizations:   . Attends Archivist Meetings:   Marland Kitchen Marital Status:   Intimate Partner Violence:   . Fear of Current or Ex-Partner:   . Emotionally Abused:   Marland Kitchen Physically Abused:   . Sexually Abused:     Family History  Problem Relation Age of Onset  . Heart attack Mother   . Heart attack Father   . Stroke Father   . Stroke Daughter   . Colon cancer Sister     Current Outpatient Medications on File Prior to Visit  Medication Sig Dispense Refill  . albuterol (VENTOLIN HFA) 108 (90 Base) MCG/ACT inhaler Inhale 2 puffs into the lungs every 6 (six) hours as needed for wheezing or  shortness of breath. 18 g 1  . Ascorbic Acid (VITAMIN C PO) Take 1 tablet by mouth daily.     Marland Kitchen aspirin EC 81 MG tablet Take 81 mg by mouth daily.    Marland Kitchen atorvastatin (LIPITOR) 40 MG tablet Take 40 mg by mouth daily.    . Azelastine-Fluticasone (DYMISTA) 137-50 MCG/ACT SUSP Place 1 spray into both nostrils 2 (two) times daily as needed. 23 g 5  . Calcium 200 MG TABS Take 200 mg by mouth daily.    . cholecalciferol (VITAMIN D) 400 units TABS tablet Take 1,200 Units by mouth daily.    . Cyanocobalamin (B-12 PO) Take 1 tablet by mouth as needed (for energy).    Marland Kitchen esomeprazole (NEXIUM) 20 MG capsule Take 20 mg by mouth as needed (for heartburn).    . fluticasone (FLONASE) 50 MCG/ACT nasal spray Place 2 sprays into both nostrils daily as needed for allergies or rhinitis. (Patient taking differently: Place 1 spray into both nostrils daily as needed for allergies or rhinitis. ) 18.2 g 5  . fluticasone furoate-vilanterol (BREO ELLIPTA) 200-25 MCG/INH AEPB Inhale 1 puff into the lungs daily. 1 each 5  . LORazepam (ATIVAN) 0.5 MG tablet Take 1 tablet (0.5 mg total) by mouth every 12 (twelve) hours as needed for anxiety. 30 tablet 0  . meclizine (ANTIVERT) 25 MG tablet     . Multiple Vitamins-Minerals (ECHINACEA ACZ PO) Take 1 capsule by mouth as needed (for immune support).    . nebivolol (BYSTOLIC) 5 MG tablet Take 1 tablet (5 mg total) by mouth daily. 30 tablet 0  . Polyethyl Glycol-Propyl Glycol (SYSTANE) 0.4-0.3 % SOLN Apply 1 drop to eye 2 (two) times a day.     . sertraline (ZOLOFT) 25 MG tablet TAKE 1 TABLET BY MOUTH EVERY DAY 90 tablet 1  . telmisartan (MICARDIS) 20 MG tablet TAKE 1 TABLET BY MOUTH EVERY DAY 90 tablet 1  . triamcinolone cream (KENALOG) 0.1 % Apply 1 application topically 2 (two) times daily as needed. 30 g 1  . triamterene-hydrochlorothiazide (MAXZIDE-25) 37.5-25 MG tablet Take 1/2 tablet by mouth daily 15 tablet 3  . vitamin E 400 UNIT capsule Take 400 Units by mouth daily.    .  [DISCONTINUED] Calcium Carbonate-Vitamin D (CALCIUM-VITAMIN D3 PO) Take 1 tablet by mouth every morning. 1200mg  of calcium     No current facility-administered medications on file prior to visit.    Allergies as of 09/19/2019 - Review Complete 09/19/2019  Allergen Reaction Noted  . Codeine Nausea Only and Other (See Comments) 07/22/2012     ROS:   General:  No weight loss, Fever, chills  HEENT: No recent headaches, no nasal bleeding, no visual changes, no sore throat  Neurologic: No dizziness, blackouts, seizures. No recent symptoms of stroke or mini- stroke. No recent episodes of slurred speech, or temporary blindness.  Cardiac: No recent episodes of chest pain/pressure, no shortness of breath at rest.  No shortness of breath with exertion.  Denies history of atrial fibrillation or irregular heartbeat  Vascular: No history of rest pain in feet.  No history of claudication.  No history of non-healing ulcer, No history of DVT   Pulmonary: No home oxygen, no productive cough, no hemoptysis,  No asthma or wheezing  Musculoskeletal:  [x ] Arthritis, [ ]  Low back pain,  [x ] Joint pain  Hematologic:No history of hypercoagulable state.  No history of easy bleeding.  No history of anemia  Gastrointestinal: No hematochezia or melena,  No gastroesophageal reflux, no trouble swallowing  Urinary: [ ]  chronic Kidney disease, [ ]  on HD - [ ]  MWF or [ ]  TTHS, [ ]  Burning with urination, [ ]  Frequent urination, [ ]  Difficulty urinating;   Skin: No rashes  Psychological: No history of anxiety,  No history of depression  Physical Examination  Vitals:   09/19/19 1346 09/19/19 1348  BP: (!) 151/87 (!) 143/82  Pulse: 79   Resp: 16   Temp: 97.6 F (36.4 C)   SpO2: 98%   Weight: 178 lb 14.4 oz (81.1 kg)   Height: 5\' 1"  (1.549 m)     Body mass index is 33.8 kg/m.  General:  Alert and oriented, no acute distress HEENT: Normal Neck: No bruit or JVD Pulmonary: Clear to auscultation  bilaterally Cardiac: Regular Rate and Rhythm without murmur Abdomen: Soft, non-tender, non-distended, no mass, no scars Skin: No rash Extremity Pulses:  2+ radial, brachial, femoral, dorsalis pedis, posterior tibial pulses bilaterally Musculoskeletal: Left ankle with pitting edema, multiple broken veins.  Anterior shin varicose vein and posterior calf.    Neurologic: Upper and lower extremity motor 5/5 and symmetric   Left        DATA: Venous Reflux Times  +--------------+---------+------+-----------+------------+--------+  RIGHT     Reflux NoRefluxReflux TimeDiameter cmsComments               Yes                   +--------------+---------+------+-----------+------------+--------+  CFV            yes  >1 second             +--------------+---------+------+-----------+------------+--------+  GSV at SFJ        yes  >500 ms   0.59        +--------------+---------+------+-----------+------------+--------+  GSV prox thigh      yes  >500 ms   0.55        +--------------+---------+------+-----------+------------+--------+  GSV mid thigh                 0.39        +--------------+---------+------+-----------+------------+--------+  GSV dist thigh                0.34        +--------------+---------+------+-----------+------------+--------+  GSV at knee                  0.24        +--------------+---------+------+-----------+------------+--------+  GSV  prox calf                 0.32        +--------------+---------+------+-----------+------------+--------+  GSV mid calf       yes  >500 ms   0.27        +--------------+---------+------+-----------+------------+--------+  SSV Pop Fossa                 0.22          +--------------+---------+------+-----------+------------+--------+  SSV prox calf                 0.22        +--------------+---------+------+-----------+------------+--------+  SSV mid calf                 0.23        +--------------+---------+------+-----------+------------+--------+     +--------------+---------+------+-----------+------------+--------+  LEFT     Reflux NoRefluxReflux TimeDiameter cmsComments               Yes                   +--------------+---------+------+-----------+------------+--------+  CFV            yes  >1 second             +--------------+---------+------+-----------+------------+--------+  GSV at SFJ        yes  >500 ms   0.65        +--------------+---------+------+-----------+------------+--------+  GSV prox thigh      yes  >500 ms   0.59        +--------------+---------+------+-----------+------------+--------+  GSV mid thigh       yes  >500 ms   0.70        +--------------+---------+------+-----------+------------+--------+  GSV dist thigh      yes  >500 ms   0.57        +--------------+---------+------+-----------+------------+--------+  GSV at knee        yes  >500 ms   0.60        +--------------+---------+------+-----------+------------+--------+  GSV prox calf       yes  >500 ms   0.27        +--------------+---------+------+-----------+------------+--------+  GSV mid calf                 0.19        +--------------+---------+------+-----------+------------+--------+  SSV Pop Fossa                 0.21        +--------------+---------+------+-----------+------------+--------+  SSV prox calf                  0.24        +--------------+---------+------+-----------+------------+--------+  SSV mid calf                 0.25        +--------------+---------+------+-----------+------------+--------+         Summary:  Right:  - No evidence of deep vein thrombosis seen in the right lower extremity,  from the common femoral through the popliteal veins.  - No evidence of superficial venous reflux seen in the right short  saphenous vein.  - Venous reflux is noted in the right common femoral vein.  - Venous reflux is noted in the right sapheno-femoral junction.  - Venous reflux is noted in the right greater saphenous vein in the thigh.  - Venous reflux is noted in the right greater saphenous vein in the calf.    Left:  - No evidence of deep  vein thrombosis seen in the left lower extremity,  from the common femoral through the popliteal veins.  - No evidence of superficial venous reflux seen in the left short  saphenous vein.  - Venous reflux is noted in the left common femoral vein.  - Venous reflux is noted in the left greater saphenous vein in the thigh.  - Venous reflux is noted in the left greater saphenous vein in the calf.  Assessment: Chronic venous insufficeny B reflux at the SFJ Right GSV to mid thigh reflux Left GSV to popliteal and mid calf reflux  Left anterior shin  varicose vein, posterior calf.  Medial thigh telangectasia.    Plan: She will be fitted with thigh high compression 20-30 mm HG to be worn daily.  Elevation of LE's when at rest.  Water therapy if available.  She will f/u in 3 months for consideration of further intervention.    Roxy Horseman PA-C [facility] [place] Office: [phone]  MD in clinic [place]

## 2019-09-20 ENCOUNTER — Telehealth: Payer: Self-pay

## 2019-09-21 ENCOUNTER — Other Ambulatory Visit: Payer: Self-pay

## 2019-09-21 MED ORDER — HYDROCHLOROTHIAZIDE 12.5 MG PO TABS: 12.5000 mg | ORAL_TABLET | Freq: Every day | ORAL | 1 refills | Status: DC

## 2019-10-04 ENCOUNTER — Ambulatory Visit (INDEPENDENT_AMBULATORY_CARE_PROVIDER_SITE_OTHER): Payer: Medicare Other | Admitting: Internal Medicine

## 2019-10-04 ENCOUNTER — Encounter: Payer: Self-pay | Admitting: Internal Medicine

## 2019-10-04 ENCOUNTER — Other Ambulatory Visit: Payer: Self-pay

## 2019-10-04 VITALS — BP 128/86 | HR 82 | Temp 98.5°F | Ht 61.6 in | Wt 181.0 lb

## 2019-10-04 DIAGNOSIS — J301 Allergic rhinitis due to pollen: Secondary | ICD-10-CM

## 2019-10-04 DIAGNOSIS — N182 Chronic kidney disease, stage 2 (mild): Secondary | ICD-10-CM | POA: Diagnosis not present

## 2019-10-04 DIAGNOSIS — I129 Hypertensive chronic kidney disease with stage 1 through stage 4 chronic kidney disease, or unspecified chronic kidney disease: Secondary | ICD-10-CM

## 2019-10-04 DIAGNOSIS — E6609 Other obesity due to excess calories: Secondary | ICD-10-CM

## 2019-10-04 DIAGNOSIS — Z6833 Body mass index (BMI) 33.0-33.9, adult: Secondary | ICD-10-CM

## 2019-10-04 DIAGNOSIS — R600 Localized edema: Secondary | ICD-10-CM

## 2019-10-04 NOTE — Patient Instructions (Signed)
Peripheral Edema  Peripheral edema is swelling that is caused by a buildup of fluid. Peripheral edema most often affects the lower legs, ankles, and feet. It can also develop in the arms, hands, and face. The area of the body that has peripheral edema will look swollen. It may also feel heavy or warm. Your clothes may start to feel tight. Pressing on the area may make a temporary dent in your skin. You may not be able to move your swollen arm or leg as much as usual. There are many causes of peripheral edema. It can happen because of a complication of other conditions such as congestive heart failure, kidney disease, or a problem with your blood circulation. It also can be a side effect of certain medicines or because of an infection. It often happens to women during pregnancy. Sometimes, the cause is not known. Follow these instructions at home: Managing pain, stiffness, and swelling   Raise (elevate) your legs while you are sitting or lying down.  Move around often to prevent stiffness and to lessen swelling.  Do not sit or stand for long periods of time.  Wear support stockings as told by your health care provider. Medicines  Take over-the-counter and prescription medicines only as told by your health care provider.  Your health care provider may prescribe medicine to help your body get rid of excess water (diuretic). General instructions  Pay attention to any changes in your symptoms.  Follow instructions from your health care provider about limiting salt (sodium) in your diet. Sometimes, eating less salt may reduce swelling.  Moisturize skin daily to help prevent skin from cracking and draining.  Keep all follow-up visits as told by your health care provider. This is important. Contact a health care provider if you have:  A fever.  Edema that starts suddenly or is getting worse, especially if you are pregnant or have a medical condition.  Swelling in only one leg.  Increased  swelling, redness, or pain in one or both of your legs.  Drainage or sores at the area where you have edema. Get help right away if you:  Develop shortness of breath, especially when you are lying down.  Have pain in your chest or abdomen.  Feel weak.  Feel faint. Summary  Peripheral edema is swelling that is caused by a buildup of fluid. Peripheral edema most often affects the lower legs, ankles, and feet.  Move around often to prevent stiffness and to lessen swelling. Do not sit or stand for long periods of time.  Pay attention to any changes in your symptoms.  Contact a health care provider if you have edema that starts suddenly or is getting worse, especially if you are pregnant or have a medical condition.  Get help right away if you develop shortness of breath, especially when lying down. This information is not intended to replace advice given to you by your health care provider. Make sure you discuss any questions you have with your health care provider. Document Revised: 02/16/2018 Document Reviewed: 02/16/2018 Elsevier Patient Education  2020 Elsevier Inc.  

## 2019-10-04 NOTE — Progress Notes (Signed)
This visit occurred during the SARS-CoV-2 public health emergency.  Safety protocols were in place, including screening questions prior to the visit, additional usage of staff PPE, and extensive cleaning of exam room while observing appropriate contact time as indicated for disinfecting solutions.  Subjective:     Patient ID: Paula Pollard , female    DOB: April 25, 1930 , 86 y.o.   MRN: [id number]   Chief Complaint  Patient presents with  . Hypertension  . Leg Swelling  . Foot Swelling    HPI  She is here today for f/u HTN. She is concerned about persistent LE swelling. She has been evaluated by vein specialist who recommended thigh high compression hose and LE elevation when seated. Pt states she has not noticed any improvement. She is concerned b/c this has never happened to her in the past. She denies SOB when seated. She denies DOE. She denies orthopnea.   Hypertension This is a chronic problem. The current episode started more than 1 year ago. The problem has been gradually improving since onset. The problem is controlled. Pertinent negatives include no blurred vision, chest pain, palpitations or shortness of breath. Risk factors for coronary artery disease include obesity and post-menopausal state. The current treatment provides moderate improvement. Hypertensive end-organ damage includes kidney disease.     Past Medical History:  Diagnosis Date  . Anemia    "in past as a young girl"  . Anxiety   . Arthritis   . Asthma   . Bronchitis    hx of  . Cataract    bilaterally, "no surgery at this time"  . Diabetes mellitus without complication ([place])    pre diabetic  . Dysrhythmia    when gets excited  . GERD (gastroesophageal reflux disease)   . Headache(784.0)    hx of migraines  . History of hiatal hernia   . Hyperlipidemia   . Hypertension    sees Dr. Terrence Dupont  . Neuromuscular disorder ([facility])    hx of carpal tunnel "never had surgery for"right hand     Family History   Problem Relation Age of Onset  . Heart attack Mother   . Heart attack Father   . Stroke Father   . Stroke Daughter   . Colon cancer Sister      Current Outpatient Medications:  .  albuterol (VENTOLIN HFA) 108 (90 Base) MCG/ACT inhaler, Inhale 2 puffs into the lungs every 6 (six) hours as needed for wheezing or shortness of breath., Disp: 18 g, Rfl: 1 .  Ascorbic Acid (VITAMIN C PO), Take 1 tablet by mouth daily. , Disp: , Rfl:  .  atorvastatin (LIPITOR) 40 MG tablet, Take 40 mg by mouth daily., Disp: , Rfl:  .  Azelastine-Fluticasone (DYMISTA) 137-50 MCG/ACT SUSP, Place 1 spray into both nostrils 2 (two) times daily as needed., Disp: 23 g, Rfl: 5 .  Calcium 200 MG TABS, Take 200 mg by mouth daily., Disp: , Rfl:  .  cholecalciferol (VITAMIN D) 400 units TABS tablet, Take 1,200 Units by mouth daily., Disp: , Rfl:  .  Cyanocobalamin (B-12 PO), Take 1 tablet by mouth as needed (for energy)., Disp: , Rfl:  .  esomeprazole (NEXIUM) 20 MG capsule, Take 20 mg by mouth as needed (for heartburn)., Disp: , Rfl:  .  fluticasone (FLONASE) 50 MCG/ACT nasal spray, Place 2 sprays into both nostrils daily as needed for allergies or rhinitis. (Patient taking differently: Place 1 spray into both nostrils daily as needed for allergies or rhinitis. ),  Disp: 18.2 g, Rfl: 5 .  fluticasone furoate-vilanterol (BREO ELLIPTA) 200-25 MCG/INH AEPB, Inhale 1 puff into the lungs daily., Disp: 1 each, Rfl: 5 .  hydrochlorothiazide (HYDRODIURIL) 12.5 MG tablet, Take 1 tablet (12.5 mg total) by mouth daily., Disp: 30 tablet, Rfl: 1 .  meclizine (ANTIVERT) 25 MG tablet, , Disp: , Rfl:  .  Multiple Vitamins-Minerals (ECHINACEA ACZ PO), Take 1 capsule by mouth as needed (for immune support)., Disp: , Rfl:  .  nebivolol (BYSTOLIC) 5 MG tablet, Take 1 tablet (5 mg total) by mouth daily., Disp: 30 tablet, Rfl: 0 .  Polyethyl Glycol-Propyl Glycol (SYSTANE) 0.4-0.3 % SOLN, Apply 1 drop to eye 2 (two) times a day. , Disp: , Rfl:  .   sertraline (ZOLOFT) 25 MG tablet, TAKE 1 TABLET BY MOUTH EVERY DAY, Disp: 90 tablet, Rfl: 1 .  telmisartan (MICARDIS) 20 MG tablet, TAKE 1 TABLET BY MOUTH EVERY DAY, Disp: 90 tablet, Rfl: 1 .  triamcinolone cream (KENALOG) 0.1 %, Apply 1 application topically 2 (two) times daily as needed., Disp: 30 g, Rfl: 1 .  vitamin E 400 UNIT capsule, Take 400 Units by mouth daily., Disp: , Rfl:  .  aspirin EC 81 MG tablet, Take 81 mg by mouth daily., Disp: , Rfl:  .  LORazepam (ATIVAN) 0.5 MG tablet, Take 1 tablet (0.5 mg total) by mouth every 12 (twelve) hours as needed for anxiety. (Patient not taking: Reported on 10/04/2019), Disp: 30 tablet, Rfl: 0 .  triamterene-hydrochlorothiazide (MAXZIDE-25) 37.5-25 MG tablet, Take 1/2 tablet by mouth daily (Patient not taking: Reported on 10/04/2019), Disp: 15 tablet, Rfl: 3   Allergies  Allergen Reactions  . Codeine Nausea Only and Other (See Comments)    Reaction:Dizziness and hallucinations "makes me climb walls"     Review of Systems  Constitutional: Negative.   HENT: Positive for congestion, postnasal drip and rhinorrhea.   Eyes: Negative for blurred vision.  Respiratory: Negative.  Negative for shortness of breath.   Cardiovascular: Positive for leg swelling. Negative for chest pain and palpitations.  Gastrointestinal: Negative.   Neurological: Negative.   Psychiatric/Behavioral: Negative.      Today's Vitals   10/04/19 1452  BP: 128/86  Pulse: 82  Temp: 98.5 F (36.9 C)  Weight: 181 lb (82.1 kg)  Height: 5' 1.6" (1.565 m)   Body mass index is 33.54 kg/m.   Objective:  Physical Exam Vitals and nursing note reviewed.  Constitutional:      Appearance: Normal appearance.  HENT:     Head: Normocephalic and atraumatic.  Cardiovascular:     Rate and Rhythm: Normal rate and regular rhythm.     Heart sounds: Normal heart sounds.  Pulmonary:     Effort: Pulmonary effort is normal.     Breath sounds: Normal breath sounds.  Musculoskeletal:      Right lower leg: 1+ Pitting Edema present.     Left lower leg: 1+ Pitting Edema present.  Skin:    General: Skin is warm.  Neurological:     General: No focal deficit present.     Mental Status: She is alert.  Psychiatric:        Mood and Affect: Mood normal.        Behavior: Behavior normal.         Assessment And Plan:     1. Hypertensive nephropathy  Chronic, well controlled. She will continue with current meds. She is encouraged to avoid adding salt to her foods.   2. Chronic  renal disease, stage II  Chronic, this has been stable. I will check renal function today. She is encouraged to stay well hydrated.   - BMP8+EGFR  3. Bilateral lower extremity edema  Chronic. Likely exacerbated by venous insufficiency. I will check BNP today.   - Brain natriuretic peptide  4. Allergic rhinitis due to pollen, unspecified seasonality  Chronic, she will try loratadine 21m daily. Also advised to perform saline flush. She will continue with fluticasone NS.   5. Class 1 obesity due to excess calories with serious comorbidity and body mass index (BMI) of 33.0 to 33.9 in adult  She is encouraged to strive for BMI less than 30 to decrease cardiac risk. Importance of regular exercise was discussed with the patient. She is advised to perform chair exercises while seated. Importance of regular, daily movement for continued mobility was also discussed with the patient.   RMaximino Greenland MD    THE PATIENT IS ENCOURAGED TO PRACTICE SOCIAL DISTANCING DUE TO THE COVID-19 PANDEMIC.

## 2019-10-05 ENCOUNTER — Telehealth: Payer: Self-pay | Admitting: Allergy and Immunology

## 2019-10-05 LAB — BMP8+EGFR
BUN/Creatinine Ratio: 18 (ref 12–28)
BUN: 14 mg/dL (ref 8–27)
CO2: 25 mmol/L (ref 20–29)
Calcium: 9.4 mg/dL (ref 8.7–10.3)
Chloride: 102 mmol/L (ref 96–106)
Creatinine, Ser: 0.77 mg/dL (ref 0.57–1.00)
GFR calc Af Amer: 80 mL/min/{1.73_m2} (ref >59)
GFR calc non Af Amer: 70 mL/min/{1.73_m2} (ref >59)
Glucose: 96 mg/dL (ref 65–99)
Potassium: 3.9 mmol/L (ref 3.5–5.2)
Sodium: 141 mmol/L (ref 134–144)

## 2019-10-05 LAB — BRAIN NATRIURETIC PEPTIDE: BNP: 52.4 pg/mL (ref 0.0–100.0)

## 2019-10-05 MED ORDER — BREO ELLIPTA 200-25 MCG/INH IN AEPB: 1.0000 | INHALATION_SPRAY | Freq: Every day | RESPIRATORY_TRACT | 5 refills | Status: DC

## 2019-10-05 NOTE — Telephone Encounter (Signed)
Refills have been sent in to requested pharmacy. Called patient and informed. Patient verbalized understanding.  ?

## 2019-10-05 NOTE — Telephone Encounter (Signed)
Patient needs to have Breo [facility] called into [facility]. [phone].

## 2019-10-09 ENCOUNTER — Telehealth: Payer: Medicare Other

## 2019-10-09 ENCOUNTER — Ambulatory Visit (INDEPENDENT_AMBULATORY_CARE_PROVIDER_SITE_OTHER): Payer: Medicare Other

## 2019-10-09 ENCOUNTER — Telehealth: Payer: Self-pay | Admitting: Allergy and Immunology

## 2019-10-09 ENCOUNTER — Other Ambulatory Visit: Payer: Self-pay

## 2019-10-09 DIAGNOSIS — J4531 Mild persistent asthma with (acute) exacerbation: Secondary | ICD-10-CM

## 2019-10-09 DIAGNOSIS — I38 Endocarditis, valve unspecified: Secondary | ICD-10-CM | POA: Diagnosis not present

## 2019-10-09 DIAGNOSIS — I10 Essential (primary) hypertension: Secondary | ICD-10-CM

## 2019-10-09 DIAGNOSIS — E559 Vitamin D deficiency, unspecified: Secondary | ICD-10-CM | POA: Diagnosis not present

## 2019-10-09 DIAGNOSIS — J45909 Unspecified asthma, uncomplicated: Secondary | ICD-10-CM | POA: Diagnosis not present

## 2019-10-09 DIAGNOSIS — E785 Hyperlipidemia, unspecified: Secondary | ICD-10-CM | POA: Diagnosis not present

## 2019-10-09 DIAGNOSIS — R0609 Other forms of dyspnea: Secondary | ICD-10-CM | POA: Diagnosis not present

## 2019-10-09 DIAGNOSIS — I2510 Atherosclerotic heart disease of native coronary artery without angina pectoris: Secondary | ICD-10-CM | POA: Diagnosis not present

## 2019-10-09 NOTE — Telephone Encounter (Signed)
Patient is requesting a less expensive inhaler. She said [facility] is $100. She said she has taken albuterol in the past and wonders if that would be ok. [facility].

## 2019-10-09 NOTE — Telephone Encounter (Signed)
Paula Pollard called patient's pharmacy and the pharmacist stated patient is in the donut hole for long acting medications so she will have to meet her deductible before insurance covers. I called patient back and informed her of that information and stated she should contact her insurance to see what would be a cheaper alternative. Patient experienced understanding and will callback later today.

## 2019-10-10 NOTE — Chronic Care Management (AMB) (Signed)
Chronic Care Management   Initial Visit Note  10/10/2019 Name: Paula Pollard MRN: [id number] DOB: December 15, 1929  Referred by: Glendale Chard, MD Reason for referral : Chronic Care Management (RQ #3 RN Initial Call-HTN/Asthma)   Paula Pollard is a 86 y.o. year old female who is a primary care patient of Glendale Chard, MD. The CCM team was consulted for assistance with chronic disease management and care coordination needs related to Asthma, HTN.  Review of patient status, including review of consultants reports, relevant laboratory and other test results, and collaboration with appropriate care team members and the patient's provider was performed as part of comprehensive patient evaluation and provision of chronic care management services.    SDOH (Social Determinants of Health) assessments performed: Yes - No acute challenges identified at this time.  See Care Plan activities for detailed interventions related to [place] initial CCM RN CM outbound call to patient to follow up on her Asthma, HTN; a care plan was established.     Medications: Outpatient Encounter Medications as of 10/09/2019  Medication Sig  . albuterol (VENTOLIN HFA) 108 (90 Base) MCG/ACT inhaler Inhale 2 puffs into the lungs every 6 (six) hours as needed for wheezing or shortness of breath.  . Ascorbic Acid (VITAMIN C PO) Take 1 tablet by mouth daily.   Marland Kitchen aspirin EC 81 MG tablet Take 81 mg by mouth daily.  Marland Kitchen atorvastatin (LIPITOR) 40 MG tablet Take 40 mg by mouth daily.  . Azelastine-Fluticasone (DYMISTA) 137-50 MCG/ACT SUSP Place 1 spray into both nostrils 2 (two) times daily as needed.  . Calcium 200 MG TABS Take 200 mg by mouth daily.  . cholecalciferol (VITAMIN D) 400 units TABS tablet Take 1,200 Units by mouth daily.  . Cyanocobalamin (B-12 PO) Take 1 tablet by mouth as needed (for energy).  Marland Kitchen esomeprazole (NEXIUM) 20 MG capsule Take 20 mg by mouth as needed (for heartburn).  . fluticasone (FLONASE) 50  MCG/ACT nasal spray Place 2 sprays into both nostrils daily as needed for allergies or rhinitis. (Patient taking differently: Place 1 spray into both nostrils daily as needed for allergies or rhinitis. )  . fluticasone furoate-vilanterol (BREO ELLIPTA) 200-25 MCG/INH AEPB Inhale 1 puff into the lungs daily.  . hydrochlorothiazide (HYDRODIURIL) 12.5 MG tablet Take 1 tablet (12.5 mg total) by mouth daily.  Marland Kitchen LORazepam (ATIVAN) 0.5 MG tablet Take 1 tablet (0.5 mg total) by mouth every 12 (twelve) hours as needed for anxiety. (Patient not taking: Reported on 10/04/2019)  . meclizine (ANTIVERT) 25 MG tablet   . Multiple Vitamins-Minerals (ECHINACEA ACZ PO) Take 1 capsule by mouth as needed (for immune support).  . nebivolol (BYSTOLIC) 5 MG tablet Take 1 tablet (5 mg total) by mouth daily.  Vladimir Faster Glycol-Propyl Glycol (SYSTANE) 0.4-0.3 % SOLN Apply 1 drop to eye 2 (two) times a day.   . sertraline (ZOLOFT) 25 MG tablet TAKE 1 TABLET BY MOUTH EVERY DAY  . telmisartan (MICARDIS) 20 MG tablet TAKE 1 TABLET BY MOUTH EVERY DAY  . triamcinolone cream (KENALOG) 0.1 % Apply 1 application topically 2 (two) times daily as needed.  . triamterene-hydrochlorothiazide (MAXZIDE-25) 37.5-25 MG tablet Take 1/2 tablet by mouth daily (Patient not taking: Reported on 10/04/2019)  . vitamin E 400 UNIT capsule Take 400 Units by mouth daily.   No facility-administered encounter medications on file as of 10/09/2019.     Objective:  Lab Results  Component Value Date   HGBA1C 5.9 (H) 08/10/2019   HGBA1C  6.1 (H) 04/12/2019   HGBA1C 5.8 (H) 10/18/2018   Lab Results  Component Value Date   MICROALBUR 10 04/12/2019   LDLCALC 69 04/12/2019   CREATININE 0.77 10/04/2019   BP Readings from Last 3 Encounters:  10/04/19 128/86  09/19/19 (!) 143/82  08/10/19 120/84    Goals Addressed      Patient Stated   .       CARE PLAN ENTRY (see longitudinal plan of care for additional care plan information)  Current Barriers:   Marland Kitchen Knowledge Deficits related to disease process and Self Health management of HTN . Chronic Disease Management support and education needs related to Asthma, HTN  Nurse Case Manager Clinical Goal(s):  Marland Kitchen Over the next 90 days, patient will work with CCM RN CM and PCP to address needs related to disease education and support to improve Self Health management of HTN  CCM RN CM Interventions:  10/10/19 call completed with patient  . Inter-disciplinary care team collaboration (see longitudinal plan of care) . Evaluation of current treatment plan related to HTN and patient's adherence to plan as established by provider. . Provided education to patient re: target BP <130/80, educated on importance of keeping BP well controlled to prevent potential complications; reviewed and discussed recent BP recorded with last OV  . Reviewed medications with patient and discussed indication, dosage and frequency of prescribed medications including recent changes, pt reports adherence  . Discussed plans with patient for ongoing care management follow up and provided patient with direct contact information for care management team . Provided patient with printed  educational materials related to What is High Blood Pressure?; Why Should I Limit Sodium?; 6 Ways to be Water Wise; Life's Simple 7  Patient Self Care Activities:  . Self administers medications as prescribed . Attends all scheduled provider appointments . Calls pharmacy for medication refills . Performs ADL's independently . Performs IADL's independently . Calls provider office for new concerns or questions  Initial goal documentation     .       Current Barriers:  . Current asthma treatment regimen: Breo Ellipta . Denies current SOB/wheezing . Does not have rescue inhaler--will have called in . Patient currently on symptomatic control with flonase and anti-histamines . She is not a smoker . BP today 138/82, HR 72 . Currently manages anxiety  with PRN alprazolam,  she has not been taking sertaline because it makes her feel different  Pharmacist Clinical Goal(s):  Marland Kitchen Over the next 90 days, patient with work with PharmD and primary care provider to address disease state management of asthma  Interventions: . Comprehensive medication review performed, medication list updated in electronic medical record . Counseled on appropriate use . Patient states she will consider patient assistance program, however she needs time to think about it  CARE PLAN ENTRY (see longitudinal plan of care for additional care plan information)  Current Barriers:  Marland Kitchen Knowledge Deficits related to disease process and Self Health management of Asthma . Chronic Disease Management support and education needs related to Asthma, HTN  Nurse Case Manager Clinical Goal(s):  Marland Kitchen Over the next 90 days, patient will work with CCM RN CM and PCP  to address needs related to disease education and support to improve Self Health management of Asthma  CCM RN CM Interventions:  10/10/19 call completed with patient  . Inter-disciplinary care team collaboration (see longitudinal plan of care) . Evaluation of current treatment plan related to Asthma and patient's adherence to plan as established  by provider . Determined patient's Asthma is very well controlled at this time, she occasionally has symptoms when outdoors too long . Reviewed medications with patient and discussed patient is having financial hardship paying for her Adair Patter; patient states her Allergist may have resolved her issue with high cost for this drug but she is unsure what interventions lead to the lower cost; Encouraged patient to notify the CCM team if pharmacy assistance is needed . Discussed plans with patient for ongoing care management follow up and provided patient with direct contact information for care management team  Patient Self Care Activities:  Patient will take medications as  prescribed . Patient will contact provider with any episodes of SOB/wheezing . Patient will report any questions or concerns to provider   Please see past updates related to this goal by clicking on the "Past Updates" button in the selected goal      .       Current Barriers:  . Uncontrolled hypertension, complicated by age . Current antihypertensive regimen: telmisartan, nebivilol, triam/HCTZ . Previous antihypertensives tried: n/a . Current home BP readings: 138/72 HR 68 . Financial: telmisartan is $47/month o Will submit tier exception with silver scripts/[employer] medicare to obtain ARB at a cheaper price  Pharmacist Clinical Goal(s):  Marland Kitchen Over the next 90 days, patient will work with PharmD and providers to optimize antihypertensive regimen  Interventions: . Comprehensive medication review performed; medication list updated in the electronic medical record.  . Counseled patient on taking medications as prescribed. . Counseled patient on diet/low salt  Patient Self Care Activities:  . Patient will continue to check BP 3 x weekly , document, and provide at future appointments . Patient will focus on medication adherence by continuing to take medications as prescribed  Initial goal documentation        Plan:   Telephone follow up appointment with care management team member scheduled for: 11/09/19  Barb Merino, RN, BSN, CCM Care Management Coordinator [facility]  Direct Phone: [phone]

## 2019-10-10 NOTE — Patient Instructions (Addendum)
Visit Information  Goals Addressed      Patient Stated   . "I would like to keep my BP under good control' (pt-stated)       [place] (see longitudinal plan of care for additional care plan information)  Current Barriers:  Marland Kitchen Knowledge Deficits related to disease process and Self Health management of HTN . Chronic Disease Management support and education needs related to Asthma, HTN  Nurse Case Manager Clinical Goal(s):  Marland Kitchen Over the next 90 days, patient will work with CCM RN CM and PCP to address needs related to disease education and support to improve Self Health management of HTN  CCM RN CM Interventions:  10/10/19 call completed with patient  . Inter-disciplinary care team collaboration (see longitudinal plan of care) . Evaluation of current treatment plan related to HTN and patient's adherence to plan as established by provider. . Provided education to patient re: target BP <130/80, educated on importance of keeping BP well controlled to prevent potential complications; reviewed and discussed recent BP recorded with last OV  . Reviewed medications with patient and discussed indication, dosage and frequency of prescribed medications including recent changes, pt reports adherence  . Discussed plans with patient for ongoing care management follow up and provided patient with direct contact information for care management team . Provided patient with printed  educational materials related to What is High Blood Pressure?; Why Should I Limit Sodium?; 6 Ways to be Water Wise; Life's Simple 7  Patient Self Care Activities:  . Self administers medications as prescribed . Attends all scheduled provider appointments . Calls pharmacy for medication refills . Performs ADL's independently . Performs IADL's independently . Calls provider office for new concerns or questions  Initial goal documentation     . I would like to manage my asthma (pt-stated)       Current Barriers:   . Current asthma treatment regimen: Breo Ellipta . Denies current SOB/wheezing . Does not have rescue inhaler--will have called in . Patient currently on symptomatic control with flonase and anti-histamines . She is not a smoker . BP today 138/82, HR 72 . Currently manages anxiety with PRN alprazolam,  she has not been taking sertaline because it makes her feel different  Pharmacist Clinical Goal(s):  Marland Kitchen Over the next 90 days, patient with work with PharmD and primary care provider to address disease state management of asthma  Interventions: . Comprehensive medication review performed, medication list updated in electronic medical record . Counseled on appropriate use . Patient states she will consider patient assistance program, however she needs time to think about it  CARE PLAN ENTRY (see longitudinal plan of care for additional care plan information)  Current Barriers:  Marland Kitchen Knowledge Deficits related to disease process and Self Health management of Asthma . Chronic Disease Management support and education needs related to Asthma, HTN  Nurse Case Manager Clinical Goal(s):  Marland Kitchen Over the next 90 days, patient will work with CCM RN CM and PCP  to address needs related to disease education and support to improve Self Health management of Asthma  CCM RN CM Interventions:  10/10/19 call completed with patient  . Inter-disciplinary care team collaboration (see longitudinal plan of care) . Evaluation of current treatment plan related to Asthma and patient's adherence to plan as established by provider . Determined patient's Asthma is very well controlled at this time, she occasionally has symptoms when outdoors too long . Reviewed medications with patient and discussed patient is having financial  hardship paying for her Adair Patter; patient states her Allergist may have resolved her issue with high cost for this drug but she is unsure what interventions lead to the lower cost; Encouraged  patient to notify the CCM team if pharmacy assistance is needed . Discussed plans with patient for ongoing care management follow up and provided patient with direct contact information for care management team  Patient Self Care Activities:  Patient will take medications as prescribed . Patient will contact provider with any episodes of SOB/wheezing . Patient will report any questions or concerns to provider   Please see past updates related to this goal by clicking on the "Past Updates" button in the selected goal      . COMPLETED: My telmisartan is expensive (pt-stated)       Current Barriers:  . Uncontrolled hypertension, complicated by age . Current antihypertensive regimen: telmisartan, nebivilol, triam/HCTZ . Previous antihypertensives tried: n/a . Current home BP readings: 138/72 HR 68 . Financial: telmisartan is $47/month o Will submit tier exception with silver scripts/[employer] medicare to obtain ARB at a cheaper price  Pharmacist Clinical Goal(s):  Marland Kitchen Over the next 90 days, patient will work with PharmD and providers to optimize antihypertensive regimen  Interventions: . Comprehensive medication review performed; medication list updated in the electronic medical record.  . Counseled patient on taking medications as prescribed. . Counseled patient on diet/low salt  Patient Self Care Activities:  . Patient will continue to check BP 3 x weekly , document, and provide at future appointments . Patient will focus on medication adherence by continuing to take medications as prescribed  Initial goal documentation      Patient verbalizes understanding of instructions provided today.   Telephone follow up appointment with care management team member scheduled for: 11/09/19  Barb Merino, RN, BSN, CCM Care Management Coordinator [place] [facility]  Direct Phone: [phone]

## 2019-10-14 ENCOUNTER — Other Ambulatory Visit: Payer: Self-pay | Admitting: Internal Medicine

## 2019-11-03 DIAGNOSIS — R079 Chest pain, unspecified: Secondary | ICD-10-CM | POA: Diagnosis not present

## 2019-11-09 ENCOUNTER — Telehealth: Payer: Self-pay

## 2019-11-23 ENCOUNTER — Telehealth: Payer: Self-pay | Admitting: Internal Medicine

## 2019-11-23 NOTE — Chronic Care Management (AMB) (Signed)
  Care Management   Note  11/23/2019 Name: Paula Pollard MRN: [id number] DOB: [birth year]  Paula Pollard is a 86 y.o. year old female who is a primary care patient of Glendale Chard, MD and is actively engaged with the care management team. I reached out to [name] by phone today to assist with scheduling an initial visit with the Pharmacist.  Follow up plan: Unsuccessful telephone outreach attempt made. The care management team will reach out to the patient again over the next 7 days. If patient returns call to provider office, please advise to call [place] at [phone].  [place], [place] [address] Direct Dial: [phone] [email or web address] Website: [email or web address]

## 2019-11-27 ENCOUNTER — Ambulatory Visit: Payer: Medicare Other | Admitting: Allergy and Immunology

## 2019-11-28 NOTE — Chronic Care Management (AMB) (Signed)
  Chronic Care Management   Note  11/28/2019 Name: Paula Pollard MRN: [id number] DOB: [birth year]  Paula Pollard is a 86 y.o. year old female who is a primary care patient of Glendale Chard, MD and is actively engaged with the care management team. I reached out to [name] by phone today to assist with scheduling an initial visit with the Pharmacist  Follow up plan: A second unsuccessful telephone outreach attempt made. A HIPPA compliant phone message was left for the patient providing contact information and requesting a return call. The care management team will reach out to the patient again over the next 7 days. If patient returns call to provider office, please advise to call [place] at [phone].  [place], [place] [address] Direct Dial: [phone] [email or web address] Website: [email or web address]

## 2019-11-28 NOTE — Chronic Care Management (AMB) (Signed)
  Care Management   Note  11/28/2019 Name: TAZIAH DIFATTA MRN: [id number] DOB: 11/15/1929  Paula Pollard is a 86 y.o. year old female who is a primary care patient of Glendale Chard, MD and is actively engaged with the care management team. I reached out to [name] by phone today to assist with scheduling an initial visit with the Pharmacist.  Follow up plan: The care management team is available to follow up with the patient after provider conversation with the patient regarding recommendation for care management engagement and subsequent re-referral to the care management team. If patient returns call to provider office, please advise to call [place] at [phone].  [place], [place] [address] Direct Dial: [phone] [email or web address] Website: [email or web address]

## 2019-12-05 ENCOUNTER — Telehealth: Payer: Self-pay

## 2019-12-13 ENCOUNTER — Telehealth: Payer: Self-pay

## 2019-12-13 ENCOUNTER — Ambulatory Visit: Payer: Medicare Other | Admitting: Internal Medicine

## 2019-12-13 NOTE — Telephone Encounter (Signed)
The pt left a message yesterday that she is in [place] out of town and that she would call back when she gets back in town to reschedule.

## 2019-12-25 ENCOUNTER — Ambulatory Visit: Payer: Self-pay

## 2019-12-25 ENCOUNTER — Other Ambulatory Visit: Payer: Self-pay

## 2019-12-25 ENCOUNTER — Telehealth: Payer: Medicare Other

## 2019-12-25 DIAGNOSIS — I10 Essential (primary) hypertension: Secondary | ICD-10-CM

## 2019-12-25 DIAGNOSIS — J4531 Mild persistent asthma with (acute) exacerbation: Secondary | ICD-10-CM

## 2019-12-26 NOTE — Chronic Care Management (AMB) (Signed)
  Chronic Care Management   Outreach Note  12/26/2019 Name: Paula Pollard MRN: [id number] DOB: 1929-07-19  Referred by: Glendale Chard, MD Reason for referral : Chronic Care Management (FU RN CM Call )   An unsuccessful telephone outreach was attempted today. The patient was referred to the case management team for assistance with care management and care coordination.   Follow Up Plan: Telephone follow up appointment with care management team member scheduled for: 02/01/20  Barb Merino, RN, BSN, CCM Care Management Coordinator [facility]  Direct Phone: [phone]

## 2019-12-27 ENCOUNTER — Ambulatory Visit: Payer: Medicare Other | Admitting: Vascular Surgery

## 2020-01-07 ENCOUNTER — Other Ambulatory Visit: Payer: Self-pay | Admitting: Allergy and Immunology

## 2020-01-15 DIAGNOSIS — Z743 Need for continuous supervision: Secondary | ICD-10-CM | POA: Diagnosis not present

## 2020-01-15 DIAGNOSIS — E785 Hyperlipidemia, unspecified: Secondary | ICD-10-CM | POA: Diagnosis not present

## 2020-01-15 DIAGNOSIS — R079 Chest pain, unspecified: Secondary | ICD-10-CM | POA: Diagnosis not present

## 2020-01-15 DIAGNOSIS — R0981 Nasal congestion: Secondary | ICD-10-CM | POA: Diagnosis not present

## 2020-01-15 DIAGNOSIS — R0989 Other specified symptoms and signs involving the circulatory and respiratory systems: Secondary | ICD-10-CM | POA: Diagnosis not present

## 2020-01-15 DIAGNOSIS — I10 Essential (primary) hypertension: Secondary | ICD-10-CM | POA: Diagnosis not present

## 2020-01-15 DIAGNOSIS — J449 Chronic obstructive pulmonary disease, unspecified: Secondary | ICD-10-CM | POA: Diagnosis not present

## 2020-01-15 DIAGNOSIS — R0789 Other chest pain: Secondary | ICD-10-CM | POA: Diagnosis not present

## 2020-01-15 DIAGNOSIS — I447 Left bundle-branch block, unspecified: Secondary | ICD-10-CM | POA: Diagnosis not present

## 2020-02-01 ENCOUNTER — Telehealth: Payer: Medicare Other

## 2020-02-05 ENCOUNTER — Telehealth: Payer: Self-pay | Admitting: Allergy and Immunology

## 2020-02-05 MED ORDER — ALBUTEROL SULFATE HFA 108 (90 BASE) MCG/ACT IN AERS: 2.0000 | INHALATION_SPRAY | Freq: Four times a day (QID) | RESPIRATORY_TRACT | 0 refills | Status: DC | PRN

## 2020-02-05 NOTE — Telephone Encounter (Signed)
Courtesy refill of Albuterol will be sent into patient's pharmacy.

## 2020-02-05 NOTE — Telephone Encounter (Signed)
Patient called and needs to have albuterol hfa called into [facility]. [phone] or [phone]

## 2020-02-05 NOTE — Telephone Encounter (Signed)
Patient is schedule to see Dr. Ernst Bowler on 03/12/2020

## 2020-02-08 DIAGNOSIS — R0789 Other chest pain: Secondary | ICD-10-CM | POA: Diagnosis not present

## 2020-02-08 DIAGNOSIS — I5030 Unspecified diastolic (congestive) heart failure: Secondary | ICD-10-CM | POA: Diagnosis not present

## 2020-02-08 DIAGNOSIS — J45909 Unspecified asthma, uncomplicated: Secondary | ICD-10-CM | POA: Diagnosis not present

## 2020-02-08 DIAGNOSIS — I2510 Atherosclerotic heart disease of native coronary artery without angina pectoris: Secondary | ICD-10-CM | POA: Diagnosis not present

## 2020-02-08 DIAGNOSIS — I10 Essential (primary) hypertension: Secondary | ICD-10-CM | POA: Diagnosis not present

## 2020-02-08 DIAGNOSIS — E785 Hyperlipidemia, unspecified: Secondary | ICD-10-CM | POA: Diagnosis not present

## 2020-02-08 DIAGNOSIS — I38 Endocarditis, valve unspecified: Secondary | ICD-10-CM | POA: Diagnosis not present

## 2020-02-14 ENCOUNTER — Ambulatory Visit (INDEPENDENT_AMBULATORY_CARE_PROVIDER_SITE_OTHER): Payer: Medicare Other | Admitting: Vascular Surgery

## 2020-02-14 ENCOUNTER — Encounter: Payer: Self-pay | Admitting: Vascular Surgery

## 2020-02-14 ENCOUNTER — Other Ambulatory Visit: Payer: Self-pay

## 2020-02-14 VITALS — BP 143/80 | HR 72 | Temp 97.5°F | Resp 18 | Ht 62.75 in | Wt 173.5 lb

## 2020-02-14 DIAGNOSIS — I83813 Varicose veins of bilateral lower extremities with pain: Secondary | ICD-10-CM

## 2020-02-14 DIAGNOSIS — I8393 Asymptomatic varicose veins of bilateral lower extremities: Secondary | ICD-10-CM

## 2020-02-14 NOTE — Progress Notes (Signed)
Patient is an 86 year old female who returns for follow-up today. She was last seen in our APP clinic in April 2021. At that time she complained of varicose veins in both legs. She describes mainly some burning over clusters of varicose veins in both legs. She also has some heaviness fullness and aching in both legs with occasional swelling. This usually improves by the next morning. She has been quite satisfied with wearing compression stockings. She is not currently interested in any intervention due to her age. She does have some skin breakdown in the left ankle area but no ulcer currently. She has mainly hemosiderin staining.  Review of systems: She had some chest pain recently and had this evaluated. She does not have shortness of breath.  Physical exam:  Vitals:   02/14/20 1500  BP: (!) 143/80  Pulse: 72  Resp: 18  Temp: (!) 97.5 F (36.4 C)  TempSrc: Temporal  SpO2: 98%  Weight: 173 lb 8 oz (78.7 kg)  Height: 5' 2.75" (1.594 m)    Extremities: Easily palpable varicosities around the left medial calf and left knee area 4 to 5 mm diameter covering a surface area 7 to 10 cm. Similar findings on the right side. 2+ dorsalis pedis pulses bilaterally.  Skin: Very fragile appearing skin left medial malleolus with hemosiderin staining.  Data: I reviewed the patient's recent venous reflux exam which showed diffuse reflux in the left greater saphenous vein with a 6 mm vein. Right side also had reflux but was only about 4 mm in diameter.  Assessment: Symptomatic varicose veins with pain left and right leg. Left leg is worse than the right.  Plan: Patient currently prefers to wear compression stockings rather than consider an intervention. She was nervous about proceeding with any intervention due to her age. I discussed with her the risk benefits possible complications of laser ablation of her left greater saphenous vein to prevent skin breakdown. I also did reassure her that she is not at risk  of limb loss and has healthy arteries.  She will think about whether or not she wishes to proceed. Otherwise she will continue to wear her compression stockings. She will follow up on an as-needed basis.  [name], MD [facility] Office: [phone]

## 2020-02-23 ENCOUNTER — Other Ambulatory Visit: Payer: Self-pay | Admitting: Allergy and Immunology

## 2020-03-04 ENCOUNTER — Telehealth: Payer: Medicare Other

## 2020-03-04 ENCOUNTER — Telehealth: Payer: Self-pay

## 2020-03-04 NOTE — Telephone Encounter (Cosign Needed)
  Chronic Care Management   Outreach Note  03/04/2020 Name: Paula Pollard MRN: [id number] DOB: Jul 11, 1929  Referred by: Glendale Chard, MD Reason for referral : Chronic Care Management (FU RN CM Call )   A second unsuccessful telephone outreach was attempted today. The patient was referred to the case management team for assistance with care management and care coordination.   Follow Up Plan: A HIPAA compliant phone message was left for the patient providing contact information and requesting a return call.  Telephone follow up appointment with care management team member scheduled for: 04/10/20  Barb Merino, RN, BSN, CCM Care Management Coordinator [place] [facility]  Direct Phone: [phone]

## 2020-03-12 ENCOUNTER — Other Ambulatory Visit: Payer: Self-pay

## 2020-03-12 ENCOUNTER — Encounter: Payer: Self-pay | Admitting: Allergy & Immunology

## 2020-03-12 ENCOUNTER — Ambulatory Visit (INDEPENDENT_AMBULATORY_CARE_PROVIDER_SITE_OTHER): Payer: Medicare Other | Admitting: Allergy & Immunology

## 2020-03-12 VITALS — BP 132/70 | HR 70 | Resp 16 | Ht 62.0 in

## 2020-03-12 DIAGNOSIS — R42 Dizziness and giddiness: Secondary | ICD-10-CM

## 2020-03-12 DIAGNOSIS — J3089 Other allergic rhinitis: Secondary | ICD-10-CM | POA: Diagnosis not present

## 2020-03-12 DIAGNOSIS — J4520 Mild intermittent asthma, uncomplicated: Secondary | ICD-10-CM | POA: Diagnosis not present

## 2020-03-12 MED ORDER — AZELASTINE-FLUTICASONE 137-50 MCG/ACT NA SUSP: 1.0000 | Freq: Two times a day (BID) | NASAL | 5 refills | Status: DC | PRN

## 2020-03-12 MED ORDER — FLUTICASONE PROPIONATE 50 MCG/ACT NA SUSP: 2.0000 | Freq: Every day | NASAL | 5 refills | Status: DC | PRN

## 2020-03-12 MED ORDER — ALBUTEROL SULFATE HFA 108 (90 BASE) MCG/ACT IN AERS: 2.0000 | INHALATION_SPRAY | Freq: Four times a day (QID) | RESPIRATORY_TRACT | 1 refills | Status: DC | PRN

## 2020-03-12 NOTE — Progress Notes (Signed)
FOLLOW UP  Date of Service/Encounter:  03/12/20   Assessment:   Mild intermittent asthma without complication  Allergic rhinitis  Vertigo   Overall, Paula Pollard is very good today.  She does have some clear mucus in bilateral nares seems to have a septal deviation.  She is not using anything else aside from fluticasone once a day.  We are going to add on Astelin once a day as well to work synergistically with the fluticasone to see if this will help resolve a lot of her postnasal drip and nasal congestion.  Her spirometry looks absolutely stellar and I do not think we need to do any changes at all.  I did try to emphasize that she should not be using the albuterol on a daily basis, especially she does not need it.  Plan/Recommendations:   1. Mild intermittent asthma without complication - Lung testing deferred today.  - Daily controller medication(s): NOTHING - Prior to physical activity: albuterol 2 puffs 10-15 minutes before physical activity. - Rescue medications: albuterol 4 puffs every 4-6 hours as needed - Changes during respiratory infections or worsening symptoms: Add on Breo 1 puff once daily for ONE TO TWO WEEKS. - Asthma control goals:  * Full participation in all desired activities (may need albuterol before activity) * Albuterol use two time or less a week on average (not counting use with activity) * Cough interfering with sleep two time or less a month * Oral steroids no more than once a year * No hospitalizations  2. Allergic rhinitis - Continue with Flonase one spray per nostril daily.  - We will send in fluticasone generic to see if this covered. - Add on azelastine one spray per nostril once daily as well (this can work with the fluticasone).  - Start the steroid dose pack if you are not better by Thursday.   3. Vertigo - Continue to follow with Dr. Benjamine Mola as needed.   4. Return in about 6 months (around 09/10/2020).   Subjective:   Paula Pollard is  a 86 y.o. female presenting today for follow up of  Chief Complaint  Patient presents with  . Asthma    Paula Pollard has a history of the following: Patient Active Problem List   Diagnosis Date Noted  . Mild intermittent asthma 06/26/2019  . Vertigo 10/28/2018  . Hypokalemia 10/28/2018  . Acute sinusitis 10/05/2017  . Other allergic rhinitis 10/05/2017  . Asthma with acute exacerbation 03/06/2015  . Unexplained night sweats 03/06/2015  . Cough 02/16/2015  . Essential hypertension, benign 08/25/2012  . Other and unspecified hyperlipidemia 08/25/2012  . Benign paroxysmal positional vertigo 08/25/2012  . Unspecified constipation 08/25/2012  . Generalized anxiety disorder 08/25/2012  . GERD (gastroesophageal reflux disease) 08/25/2012  . Osteoarthritis of right hip 08/10/2012    History obtained from: chart review and patient.  Paula Pollard is a 86 y.o. female presenting for a follow up visit.  She was last seen in January 2021 by Dr. Verlin Fester.  At that time, she was continued on albuterol 1 to 2 puffs every 4-6 hours as needed.  She does have Breo that she adds during respiratory flares.  For her allergic rhinitis, she was continued on Dymista 1 spray per nostril daily as needed as well as nasal saline rinses.  For her vertigo was improved, but she has been seen by Dr. Benjamine Mola in the past.   Yesterday she has had worsening pressure in her ears. She took some Flonase last night  and this helped with her nose. She does use her fluticasone every night, but she does not seem to remember any Astelin or Dymista. She has seen Dr. Benjamine Mola in the past and was treated for vertigo using some kind of virtual reality treatment. She did two treatments and was cured. She is very thankful for all of this.   Paula Pollard's asthma has been well controlled. She has not required rescue medication, experienced nocturnal awakenings due to lower respiratory symptoms, nor have activities of daily living been limited. She  has required no Emergency Department or Urgent Care visits for her asthma. She has required zero courses of systemic steroids for asthma exacerbations since the last visit. ACT score today is 25, indicating excellent asthma symptom control. She has been using her albuterol every morning for past few weeks. She is not clear on why she has been doing this. She has not needed any prednisone and has not needed her ICS.   Otherwise, there have been no changes to her past medical history, surgical history, family history, or social history.    Review of Systems  Constitutional: Negative.  Negative for chills, fever, malaise/fatigue and weight loss.  HENT: Negative for congestion, ear discharge, ear pain and sinus pain.   Eyes: Negative for pain, discharge and redness.  Respiratory: Positive for cough. Negative for sputum production, shortness of breath and wheezing.   Cardiovascular: Negative.  Negative for chest pain and palpitations.  Gastrointestinal: Negative for abdominal pain, constipation, diarrhea, heartburn, nausea and vomiting.  Skin: Negative.  Negative for itching and rash.  Neurological: Negative for dizziness and headaches.  Endo/Heme/Allergies: Negative for environmental allergies. Does not bruise/bleed easily.       Objective:   Blood pressure 132/70, pulse 70, resp. rate 16, height 5\' 2"  (1.575 m), SpO2 95 %. Body mass index is 31.73 kg/m.   Physical Exam:  Physical Exam Constitutional:      Appearance: She is well-developed.     Comments: Pleasant female.  Very talkative.   HENT:     Head: Normocephalic and atraumatic.     Right Ear: Tympanic membrane, ear canal and external ear normal.     Left Ear: Tympanic membrane, ear canal and external ear normal.     Nose: Nasal deformity and septal deviation present. No mucosal edema or rhinorrhea.     Right Turbinates: Enlarged and swollen.     Left Turbinates: Enlarged and swollen.     Right Sinus: No maxillary sinus  tenderness or frontal sinus tenderness.     Left Sinus: No maxillary sinus tenderness or frontal sinus tenderness.     Comments: Turbinates enlarged. There is some cobblestoning present in the posterior oropharynx.     Mouth/Throat:     Mouth: Mucous membranes are not pale and not dry.     Pharynx: Uvula midline.  Eyes:     General:        Right eye: No discharge.        Left eye: No discharge.     Conjunctiva/sclera: Conjunctivae normal.     Right eye: Right conjunctiva is not injected. No chemosis.    Left eye: Left conjunctiva is not injected. No chemosis.    Pupils: Pupils are equal, round, and reactive to light.  Cardiovascular:     Rate and Rhythm: Normal rate and regular rhythm.     Heart sounds: Normal heart sounds.  Pulmonary:     Effort: Pulmonary effort is normal. No tachypnea, accessory muscle usage or respiratory  distress.     Breath sounds: Normal breath sounds. No wheezing, rhonchi or rales.     Comments: Moving air well in all lung fields. No increased work of breathing noted.  Chest:     Chest wall: No tenderness.  Lymphadenopathy:     Cervical: No cervical adenopathy.  Skin:    Coloration: Skin is not pale.     Findings: No abrasion, erythema, petechiae or rash. Rash is not papular, urticarial or vesicular.  Neurological:     Mental Status: She is alert.  Psychiatric:        Behavior: Behavior is cooperative.      Diagnostic studies:    Spirometry: results normal (FEV1: 1.29/89%, FVC: 1.75/88%, FEV1/FVC: 74%).    Spirometry consistent with normal pattern.   Allergy Studies: none       Salvatore Marvel, MD  [facility]

## 2020-03-12 NOTE — Patient Instructions (Addendum)
1. Mild intermittent asthma without complication - Lung testing deferred today.  - Daily controller medication(s): NOTHING - Prior to physical activity: albuterol 2 puffs 10-15 minutes before physical activity. - Rescue medications: albuterol 4 puffs every 4-6 hours as needed - Changes during respiratory infections or worsening symptoms: Add on Breo 1 puff once daily for ONE TO TWO WEEKS. - Asthma control goals:  * Full participation in all desired activities (may need albuterol before activity) * Albuterol use two time or less a week on average (not counting use with activity) * Cough interfering with sleep two time or less a month * Oral steroids no more than once a year * No hospitalizations  2. Allergic rhinitis - Continue with Flonase one spray per nostril daily.  - We will send in fluticasone generic to see if this covered. - Add on azelastine one spray per nostril once daily as well (this can work with the fluticasone).  - Start the steroid dose pack if you are not better by Thursday.   3. Vertigo - Continue to follow with Dr. Benjamine Mola as needed.   4. Return in about 6 months (around 09/10/2020).    Please inform us of any Emergency Department visits, hospitalizations, or changes in symptoms. Call us before going to the ED for breathing or allergy symptoms since we might be able to fit you in for a sick visit. Feel free to contact us anytime with any questions, problems, or concerns.  It was a pleasure to meet you today!  Websites that have reliable patient information: 1. American Academy of Asthma, Allergy, and Immunology: www.aaaai.org 2. Food Allergy Research and Education (FARE): foodallergy.org 3. Mothers of Asthmatics: http://www.asthmacommunitynetwork.org 4. American College of Allergy, Asthma, and Immunology: www.acaai.org   COVID-19 Vaccine Information can be found at: ShippingScam.co.uk For questions related to  vaccine distribution or appointments, please email [email or web address] or call [phone].     "Like" [place] on Facebook and Instagram for our latest updates!        Make sure you are registered to vote! If you have moved or changed any of your contact information, you will need to get this updated before voting!  In some cases, you MAY be able to register to vote online: [email or web address]

## 2020-04-03 DIAGNOSIS — M85852 Other specified disorders of bone density and structure, left thigh: Secondary | ICD-10-CM | POA: Diagnosis not present

## 2020-04-03 DIAGNOSIS — Z1231 Encounter for screening mammogram for malignant neoplasm of breast: Secondary | ICD-10-CM | POA: Diagnosis not present

## 2020-04-04 ENCOUNTER — Other Ambulatory Visit: Payer: Self-pay | Admitting: Internal Medicine

## 2020-04-10 ENCOUNTER — Telehealth: Payer: Medicare Other

## 2020-04-11 DIAGNOSIS — N907 Vulvar cyst: Secondary | ICD-10-CM | POA: Diagnosis not present

## 2020-04-11 DIAGNOSIS — N9089 Other specified noninflammatory disorders of vulva and perineum: Secondary | ICD-10-CM | POA: Diagnosis not present

## 2020-04-11 DIAGNOSIS — I10 Essential (primary) hypertension: Secondary | ICD-10-CM | POA: Diagnosis not present

## 2020-04-15 IMAGING — DX PORTABLE CHEST - 1 VIEW
1 series · 1 of 1 positions shown · non-contrast
Comparison: Radiographs 02/16/2015 and 12/26/2014.

CLINICAL DATA: Altered level of consciousness. Vertigo/dizziness
with chest pain. History of hypertension and diabetes.

EXAM:
PORTABLE CHEST 1 VIEW

[chest ap]
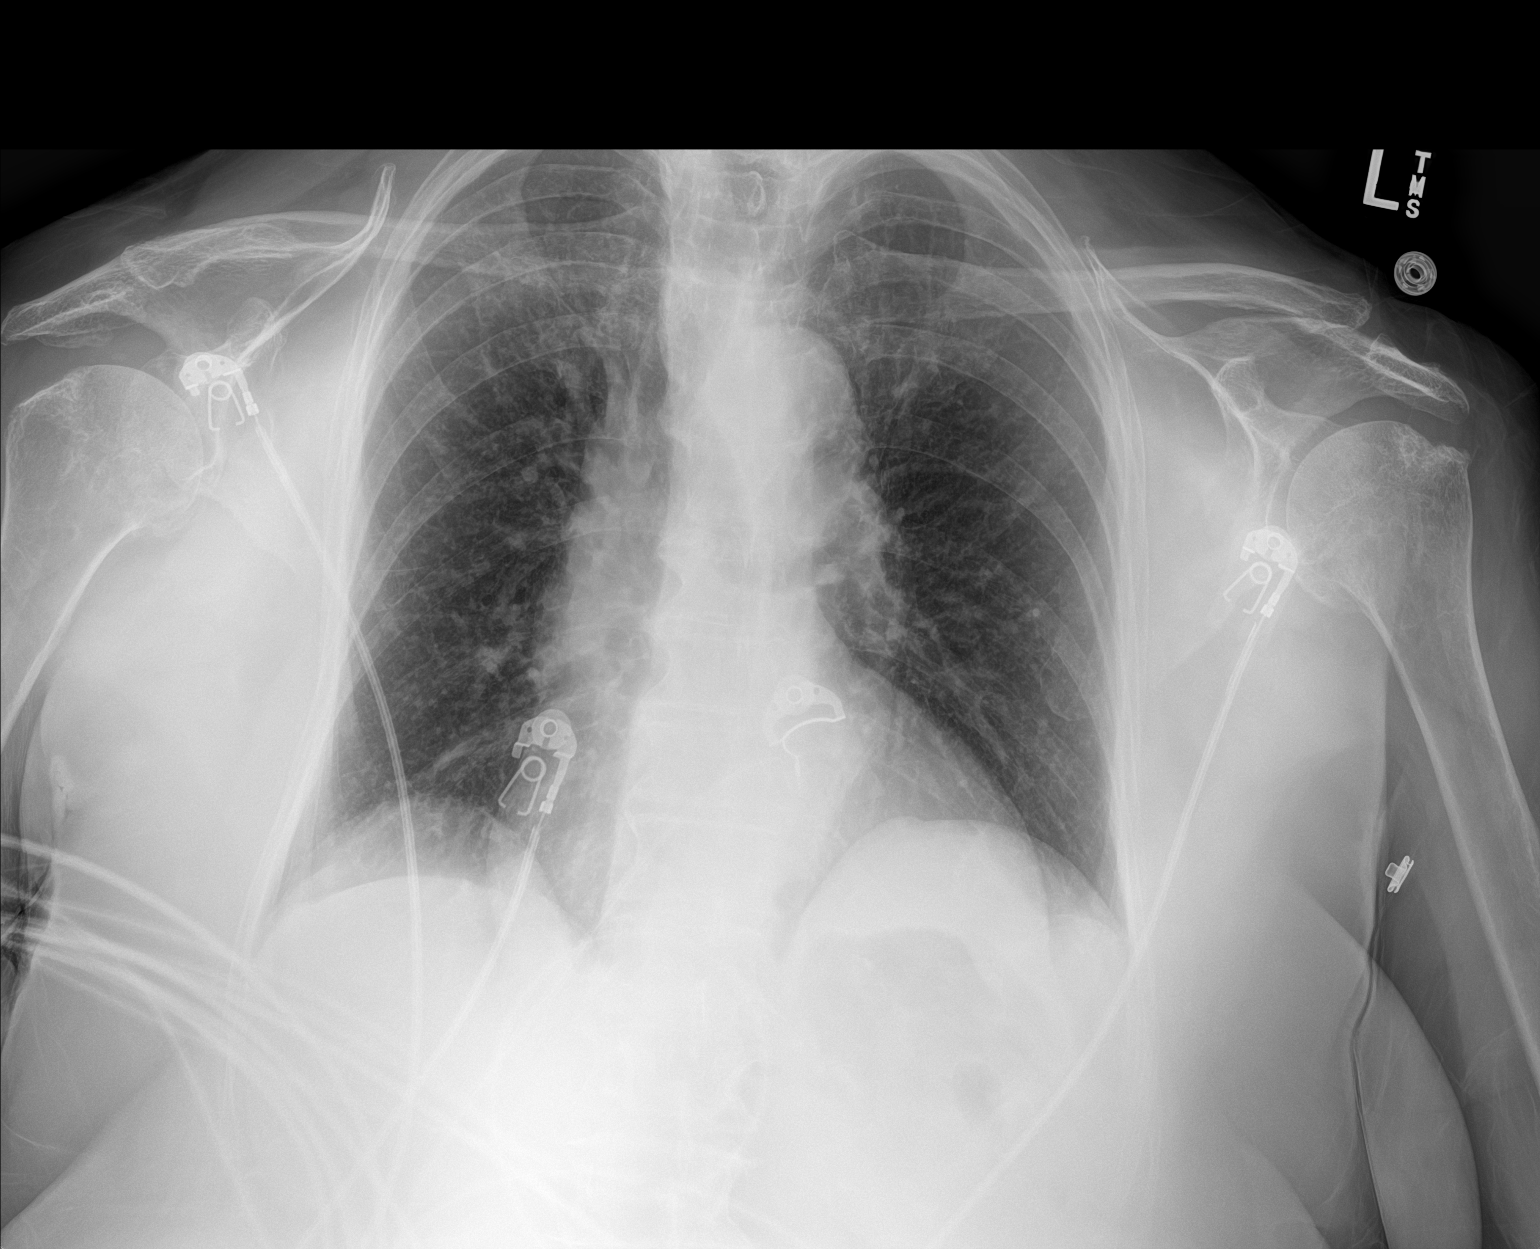

[1 of 1 positions shown; findings below may reference images not displayed]

FINDINGS: 5225 hours. The heart size and mediastinal contours are stable.
There is mild aortic atherosclerosis. There is a lesser degree of
inspiration, but no airspace disease, edema, pleural effusion or
pneumothorax. No acute osseous findings are evident. Telemetry leads
overlie the chest.
IMPRESSION: No active cardiopulmonary process.

## 2020-04-16 ENCOUNTER — Ambulatory Visit: Payer: Medicare Other

## 2020-04-16 ENCOUNTER — Ambulatory Visit: Payer: Medicare Other | Admitting: Internal Medicine

## 2020-04-18 ENCOUNTER — Ambulatory Visit (INDEPENDENT_AMBULATORY_CARE_PROVIDER_SITE_OTHER): Payer: Medicare Other | Admitting: Internal Medicine

## 2020-04-18 ENCOUNTER — Ambulatory Visit (INDEPENDENT_AMBULATORY_CARE_PROVIDER_SITE_OTHER): Payer: Medicare Other

## 2020-04-18 ENCOUNTER — Other Ambulatory Visit: Payer: Self-pay

## 2020-04-18 ENCOUNTER — Encounter: Payer: Self-pay | Admitting: Internal Medicine

## 2020-04-18 VITALS — BP 142/90 | HR 83 | Temp 98.0°F | Ht 61.6 in | Wt 173.0 lb

## 2020-04-18 VITALS — BP 142/90 | HR 83 | Temp 98.0°F | Ht 61.6 in | Wt 173.6 lb

## 2020-04-18 DIAGNOSIS — I129 Hypertensive chronic kidney disease with stage 1 through stage 4 chronic kidney disease, or unspecified chronic kidney disease: Secondary | ICD-10-CM

## 2020-04-18 DIAGNOSIS — R7309 Other abnormal glucose: Secondary | ICD-10-CM | POA: Diagnosis not present

## 2020-04-18 DIAGNOSIS — Z0000 Encounter for general adult medical examination without abnormal findings: Secondary | ICD-10-CM | POA: Diagnosis not present

## 2020-04-18 DIAGNOSIS — Z23 Encounter for immunization: Secondary | ICD-10-CM | POA: Diagnosis not present

## 2020-04-18 DIAGNOSIS — E6609 Other obesity due to excess calories: Secondary | ICD-10-CM | POA: Diagnosis not present

## 2020-04-18 DIAGNOSIS — I10 Essential (primary) hypertension: Secondary | ICD-10-CM

## 2020-04-18 DIAGNOSIS — N182 Chronic kidney disease, stage 2 (mild): Secondary | ICD-10-CM

## 2020-04-18 DIAGNOSIS — L989 Disorder of the skin and subcutaneous tissue, unspecified: Secondary | ICD-10-CM | POA: Diagnosis not present

## 2020-04-18 DIAGNOSIS — Z6832 Body mass index (BMI) 32.0-32.9, adult: Secondary | ICD-10-CM

## 2020-04-18 LAB — POCT URINALYSIS DIPSTICK
Bilirubin, UA: NEGATIVE
Glucose, UA: NEGATIVE
Ketones, UA: NEGATIVE
Leukocytes, UA: NEGATIVE
Nitrite, UA: NEGATIVE
Protein, UA: NEGATIVE
Spec Grav, UA: 1.015 (ref 1.010–1.025)
Urobilinogen, UA: 0.2 E.U./dL
pH, UA: 6.5 (ref 5.0–8.0)

## 2020-04-18 LAB — POCT UA - MICROALBUMIN
Albumin/Creatinine Ratio, Urine, POC: <30
Creatinine, POC: 50 mg/dL
Microalbumin Ur, POC: 10 mg/L

## 2020-04-18 NOTE — Addendum Note (Signed)
Addended by: Kellie Simmering on: 04/18/2020 03:22 PM   Modules accepted: Orders

## 2020-04-18 NOTE — Patient Instructions (Signed)

## 2020-04-18 NOTE — Progress Notes (Signed)
I,Tianna Badgett,acting as a Education administrator for [name], MD.,have documented all relevant documentation on the behalf of [name], MD,as directed by  [name], MD while in the presence of [name], MD.  This visit occurred during the SARS-CoV-2 public health emergency.  Safety protocols were in place, including screening questions prior to the visit, additional usage of staff PPE, and extensive cleaning of exam room while observing appropriate contact time as indicated for disinfecting solutions.  Subjective:     Patient ID: Paula Pollard , female    DOB: Jun 12, 1929 , 86 y.o.   MRN: [id number]   Chief Complaint  Patient presents with  . Hypertension    HPI  She is here today for BP check. She reports compliance with meds. She also had AWV performed today.   She spent three months with her daughter in [place], [place]. She is happy to be back home. She enjoyed spending time with her family.  She is upset today because her great-granddaughter died two weeks ago 08/01/22.   Hypertension This is a chronic problem. The current episode started more than 1 year ago. The problem has been gradually improving since onset. The problem is controlled. Pertinent negatives include no blurred vision, chest pain, palpitations or shortness of breath. Risk factors for coronary artery disease include sedentary lifestyle, obesity, stress and post-menopausal state. There are no compliance problems.  Hypertensive end-organ damage includes kidney disease.     Past Medical History:  Diagnosis Date  . Anemia    "in past as a young girl"  . Anxiety   . Arthritis   . Asthma   . Bronchitis    hx of  . Cataract    bilaterally, "no surgery at this time"  . Diabetes mellitus without complication ([place])    pre diabetic  . Dysrhythmia    when gets excited  . GERD (gastroesophageal reflux disease)   . Headache(784.0)    hx of migraines  . History of hiatal hernia   . Hyperlipidemia    . Hypertension    sees Dr. Terrence Dupont  . Neuromuscular disorder (HCC)    hx of carpal tunnel "never had surgery for"right hand     Family History  Problem Relation Age of Onset  . Heart attack Mother   . Heart attack Father   . Stroke Father   . Stroke Daughter   . Colon cancer Sister      Current Outpatient Medications:  .  albuterol (VENTOLIN HFA) 108 (90 Base) MCG/ACT inhaler, Inhale 2 puffs into the lungs every 6 (six) hours as needed for wheezing or shortness of breath., Disp: 18 g, Rfl: 1 .  Ascorbic Acid (VITAMIN C PO), Take 1 tablet by mouth daily. , Disp: , Rfl:  .  aspirin EC 81 MG tablet, Take 81 mg by mouth daily., Disp: , Rfl:  .  atorvastatin (LIPITOR) 40 MG tablet, Take 40 mg by mouth daily., Disp: , Rfl:  .  Azelastine-Fluticasone (DYMISTA) 137-50 MCG/ACT SUSP, Place 1 spray into both nostrils 2 (two) times daily as needed., Disp: 23 g, Rfl: 5 .  Calcium 200 MG TABS, Take 200 mg by mouth daily., Disp: , Rfl:  .  cholecalciferol (VITAMIN D) 400 units TABS tablet, Take 1,200 Units by mouth daily., Disp: , Rfl:  .  Cyanocobalamin (B-12 PO), Take 1 tablet by mouth as needed (for energy)., Disp: , Rfl:  .  esomeprazole (NEXIUM) 20 MG capsule, Take 20 mg by mouth as  needed (for heartburn)., Disp: , Rfl:  .  fluticasone (FLONASE) 50 MCG/ACT nasal spray, Place 2 sprays into both nostrils daily as needed for allergies or rhinitis., Disp: 18.2 g, Rfl: 5 .  hydrochlorothiazide (HYDRODIURIL) 12.5 MG tablet, TAKE 1 TABLET BY MOUTH EVERY DAY, Disp: 90 tablet, Rfl: 1 .  LORazepam (ATIVAN) 0.5 MG tablet, Take 1 tablet (0.5 mg total) by mouth every 12 (twelve) hours as needed for anxiety., Disp: 30 tablet, Rfl: 0 .  meclizine (ANTIVERT) 25 MG tablet, , Disp: , Rfl:  .  Multiple Vitamins-Minerals (ECHINACEA ACZ PO), Take 1 capsule by mouth as needed (for immune support)., Disp: , Rfl:  .  nebivolol (BYSTOLIC) 5 MG tablet, Take 1 tablet (5 mg total) by mouth daily., Disp: 30 tablet, Rfl:  0 .  Polyethyl Glycol-Propyl Glycol (SYSTANE) 0.4-0.3 % SOLN, Apply 1 drop to eye 2 (two) times a day. , Disp: , Rfl:  .  sertraline (ZOLOFT) 25 MG tablet, TAKE 1 TABLET BY MOUTH EVERY DAY (Patient not taking: Reported on 04/18/2020), Disp: 90 tablet, Rfl: 1 .  telmisartan (MICARDIS) 20 MG tablet, TAKE 1 TABLET BY MOUTH EVERY DAY, Disp: 90 tablet, Rfl: 1 .  triamcinolone cream (KENALOG) 0.1 %, Apply 1 application topically 2 (two) times daily as needed., Disp: 30 g, Rfl: 1 .  triamterene-hydrochlorothiazide (MAXZIDE-25) 37.5-25 MG tablet, Take 1/2 tablet by mouth daily (Patient not taking: Reported on 04/18/2020), Disp: 15 tablet, Rfl: 3 .  vitamin E 400 UNIT capsule, Take 400 Units by mouth daily., Disp: , Rfl:    Allergies  Allergen Reactions  . Codeine Nausea Only and Other (See Comments)    Reaction:Dizziness and hallucinations "makes me climb walls"     Review of Systems  Constitutional: Negative.   Eyes: Negative for blurred vision.  Respiratory: Negative.  Negative for shortness of breath.   Cardiovascular: Negative.  Negative for chest pain and palpitations.  Gastrointestinal: Negative.   Neurological: Negative.      Today's Vitals   04/18/20 1117  BP: (!) 142/90  Pulse: 83  Temp: 98 F (36.7 C)  TempSrc: Oral  Weight: 173 lb (78.5 kg)  Height: 5' 1.6" (1.565 m)   Body mass index is 32.05 kg/m.   Objective:  Physical Exam Vitals and nursing note reviewed.  Constitutional:      Appearance: Normal appearance. She is obese.  HENT:     Head: Normocephalic and atraumatic.  Cardiovascular:     Rate and Rhythm: Normal rate and regular rhythm.     Heart sounds: Normal heart sounds.  Pulmonary:     Effort: Pulmonary effort is normal.     Breath sounds: Normal breath sounds.  Skin:    General: Skin is warm.     Comments: Flesh colored lesion right forehead. Scaly.   Neurological:     General: No focal deficit present.     Mental Status: She is alert.  Psychiatric:         Mood and Affect: Mood normal.        Behavior: Behavior normal.         Assessment And Plan:     1. Hypertensive nephropathy Comments: Chronic, fair control. She will continue with current meds for now. Encouraged to limit he rsalt intake and to increase daily activity. Advised to perform chair - CMP14+EGFR - Lipid panel  2. Chronic renal disease, stage II Comments: Chronic, I will check renal function today. She is encouraged to stay well hydrated and keep BP  optimally controlled.   3. Other abnormal glucose Comments: Her hba1 has been elevated in the past, I will recheck an a1c today. She is encouraged to avoid sguary beverages, including sweet teas, sodas and diet drinks.  - Hemoglobin A1c  4. Class 1 obesity due to excess calories with serious comorbidity and body mass index (BMI) of 32.0 to 32.9 in adult Comments: She is encouraged to strive for BMI less than 30 to decrease cardiac risk. Advised to perform chair exercises while watching TV.      Patient was given opportunity to ask questions. Patient verbalized understanding of the plan and was able to repeat key elements of the plan. All questions were answered to their satisfaction.  [name], MD   I, [name], MD, have reviewed all documentation for this visit. The documentation on 04/21/20 for the exam, diagnosis, procedures, and orders are all accurate and complete.  THE PATIENT IS ENCOURAGED TO PRACTICE SOCIAL DISTANCING DUE TO THE COVID-19 PANDEMIC.

## 2020-04-18 NOTE — Patient Instructions (Signed)
Paula Pollard , Thank you for taking time to come for your Medicare Wellness Visit. I appreciate your ongoing commitment to your health goals. Please review the following plan we discussed and let me know if I can assist you in the future.   Screening recommendations/referrals: Colonoscopy: not required Mammogram: completed 04/03/2020 Bone Density: completed 04/03/2020 Recommended yearly ophthalmology/optometry visit for glaucoma screening and checkup Recommended yearly dental visit for hygiene and checkup  Vaccinations: Influenza vaccine: decline Pneumococcal vaccine: today Tdap vaccine: completed 04/05/2018, due 04/05/2028 Shingles vaccine: discussed   Covid-19: 08/21/2019, 07/23/2019  Advanced directives: Please bring a copy of your POA (Power of Attorney) and/or Living Will to your next appointment.   Conditions/risks identified: none  Next appointment: Follow up in one year for your annual wellness visit 04/23/2021 at 10:00   Preventive Care 65 Years and Older, Female Preventive care refers to lifestyle choices and visits with your health care provider that can promote health and wellness. What does preventive care include?  A yearly physical exam. This is also called an annual well check.  Dental exams once or twice a year.  Routine eye exams. Ask your health care provider how often you should have your eyes checked.  Personal lifestyle choices, including:  Daily care of your teeth and gums.  Regular physical activity.  Eating a healthy diet.  Avoiding tobacco and drug use.  Limiting alcohol use.  Practicing safe sex.  Taking low-dose aspirin every day.  Taking vitamin and mineral supplements as recommended by your health care provider. What happens during an annual well check? The services and screenings done by your health care provider during your annual well check will depend on your age, overall health, lifestyle risk factors, and family history of  disease. Counseling  Your health care provider may ask you questions about your:  Alcohol use.  Tobacco use.  Drug use.  Emotional well-being.  Home and relationship well-being.  Sexual activity.  Eating habits.  History of falls.  Memory and ability to understand (cognition).  Work and work Statistician.  Reproductive health. Screening  You may have the following tests or measurements:  Height, weight, and BMI.  Blood pressure.  Lipid and cholesterol levels. These may be checked every 5 years, or more frequently if you are over 84 years old.  Skin check.  Lung cancer screening. You may have this screening every year starting at age 84 if you have a 30-pack-year history of smoking and currently smoke or have quit within the past 15 years.  Fecal occult blood test (FOBT) of the stool. You may have this test every year starting at age 84.  Flexible sigmoidoscopy or colonoscopy. You may have a sigmoidoscopy every 5 years or a colonoscopy every 10 years starting at age 84.  Hepatitis C blood test.  Hepatitis B blood test.  Sexually transmitted disease (STD) testing.  Diabetes screening. This is done by checking your blood sugar (glucose) after you have not eaten for a while (fasting). You may have this done every 1-3 years.  Bone density scan. This is done to screen for osteoporosis. You may have this done starting at age 84.  Mammogram. This may be done every 1-2 years. Talk to your health care provider about how often you should have regular mammograms. Talk with your health care provider about your test results, treatment options, and if necessary, the need for more tests. Vaccines  Your health care provider may recommend certain vaccines, such as:  Influenza vaccine. This is  recommended every year.  Tetanus, diphtheria, and acellular pertussis (Tdap, Td) vaccine. You may need a Td booster every 10 years.  Zoster vaccine. You may need this after age  76.  Pneumococcal 13-valent conjugate (PCV13) vaccine. One dose is recommended after age 84.  Pneumococcal polysaccharide (PPSV23) vaccine. One dose is recommended after age 69. Talk to your health care provider about which screenings and vaccines you need and how often you need them. This information is not intended to replace advice given to you by your health care provider. Make sure you discuss any questions you have with your health care provider. Document Released: 06/21/2015 Document Revised: 02/12/2016 Document Reviewed: 03/26/2015 Elsevier Interactive Patient Education  2017 [place] Prevention in the Home Falls can cause injuries. They can happen to people of all ages. There are many things you can do to make your home safe and to help prevent falls. What can I do on the outside of my home?  Regularly fix the edges of walkways and driveways and fix any cracks.  Remove anything that might make you trip as you walk through a door, such as a raised step or threshold.  Trim any bushes or trees on the path to your home.  Use bright outdoor lighting.  Clear any walking paths of anything that might make someone trip, such as rocks or tools.  Regularly check to see if handrails are loose or broken. Make sure that both sides of any steps have handrails.  Any raised decks and porches should have guardrails on the edges.  Have any leaves, snow, or ice cleared regularly.  Use sand or salt on walking paths during winter.  Clean up any spills in your garage right away. This includes oil or grease spills. What can I do in the bathroom?  Use night lights.  Install grab bars by the toilet and in the tub and shower. Do not use towel bars as grab bars.  Use non-skid mats or decals in the tub or shower.  If you need to sit down in the shower, use a plastic, non-slip stool.  Keep the floor dry. Clean up any water that spills on the floor as soon as it happens.  Remove  soap buildup in the tub or shower regularly.  Attach bath mats securely with double-sided non-slip rug tape.  Do not have throw rugs and other things on the floor that can make you trip. What can I do in the bedroom?  Use night lights.  Make sure that you have a light by your bed that is easy to reach.  Do not use any sheets or blankets that are too big for your bed. They should not hang down onto the floor.  Have a firm chair that has side arms. You can use this for support while you get dressed.  Do not have throw rugs and other things on the floor that can make you trip. What can I do in the kitchen?  Clean up any spills right away.  Avoid walking on wet floors.  Keep items that you use a lot in easy-to-reach places.  If you need to reach something above you, use a strong step stool that has a grab bar.  Keep electrical cords out of the way.  Do not use floor polish or wax that makes floors slippery. If you must use wax, use non-skid floor wax.  Do not have throw rugs and other things on the floor that can make you trip.  What can I do with my stairs?  Do not leave any items on the stairs.  Make sure that there are handrails on both sides of the stairs and use them. Fix handrails that are broken or loose. Make sure that handrails are as long as the stairways.  Check any carpeting to make sure that it is firmly attached to the stairs. Fix any carpet that is loose or worn.  Avoid having throw rugs at the top or bottom of the stairs. If you do have throw rugs, attach them to the floor with carpet tape.  Make sure that you have a light switch at the top of the stairs and the bottom of the stairs. If you do not have them, ask someone to add them for you. What else can I do to help prevent falls?  Wear shoes that:  Do not have high heels.  Have rubber bottoms.  Are comfortable and fit you well.  Are closed at the toe. Do not wear sandals.  If you use a  stepladder:  Make sure that it is fully opened. Do not climb a closed stepladder.  Make sure that both sides of the stepladder are locked into place.  Ask someone to hold it for you, if possible.  Clearly mark and make sure that you can see:  Any grab bars or handrails.  First and last steps.  Where the edge of each step is.  Use tools that help you move around (mobility aids) if they are needed. These include:  Canes.  Walkers.  Scooters.  Crutches.  Turn on the lights when you go into a dark area. Replace any light bulbs as soon as they burn out.  Set up your furniture so you have a clear path. Avoid moving your furniture around.  If any of your floors are uneven, fix them.  If there are any pets around you, be aware of where they are.  Review your medicines with your doctor. Some medicines can make you feel dizzy. This can increase your chance of falling. Ask your doctor what other things that you can do to help prevent falls. This information is not intended to replace advice given to you by your health care provider. Make sure you discuss any questions you have with your health care provider. Document Released: 03/21/2009 Document Revised: 10/31/2015 Document Reviewed: 06/29/2014 Elsevier Interactive Patient Education  2017 [employer].

## 2020-04-18 NOTE — Progress Notes (Signed)
This visit occurred during the SARS-CoV-2 public health emergency.  Safety protocols were in place, including screening questions prior to the visit, additional usage of staff PPE, and extensive cleaning of exam room while observing appropriate contact time as indicated for disinfecting solutions.  Subjective:   Paula Pollard is a 86 y.o. female who presents for Medicare Annual (Subsequent) preventive examination.  Review of Systems     Cardiac Risk Factors include: advanced age (>1men, >74 women);hypertension;obesity (BMI >30kg/m2)     Objective:    Today's Vitals   04/18/20 1056  BP: (!) 142/90  Pulse: 83  Temp: 98 F (36.7 C)  TempSrc: Oral  SpO2: 95%  Weight: 173 lb 9.6 oz (78.7 kg)  Height: 5' 1.6" (1.565 m)   Body mass index is 32.17 kg/m.  Advanced Directives 04/18/2020 04/12/2019 10/28/2018 04/06/2018 12/31/2017 02/26/2015 12/26/2014  Does Patient Have a Medical Advance Directive? Yes Yes No No No Yes No  Type of Paramedic of [place];Living will [place];Living will - - - [place];Living will -  Copy of [place] in Chart? No - copy requested No - copy requested - - - - -  Would patient like information on creating a medical advance directive? - - No - Patient declined No - Patient declined Yes (MAU/Ambulatory/Procedural Areas - Information given) - -    Current Medications (verified) Outpatient Encounter Medications as of 04/18/2020  Medication Sig  . albuterol (VENTOLIN HFA) 108 (90 Base) MCG/ACT inhaler Inhale 2 puffs into the lungs every 6 (six) hours as needed for wheezing or shortness of breath.  . Ascorbic Acid (VITAMIN C PO) Take 1 tablet by mouth daily.   Marland Kitchen aspirin EC 81 MG tablet Take 81 mg by mouth daily.  Marland Kitchen atorvastatin (LIPITOR) 40 MG tablet Take 40 mg by mouth daily.  . Azelastine-Fluticasone (DYMISTA) 137-50 MCG/ACT SUSP Place 1 spray into both nostrils 2 (two) times  daily as needed.  . Calcium 200 MG TABS Take 200 mg by mouth daily.  . cholecalciferol (VITAMIN D) 400 units TABS tablet Take 1,200 Units by mouth daily.  . Cyanocobalamin (B-12 PO) Take 1 tablet by mouth as needed (for energy).  Marland Kitchen esomeprazole (NEXIUM) 20 MG capsule Take 20 mg by mouth as needed (for heartburn).  . fluticasone (FLONASE) 50 MCG/ACT nasal spray Place 2 sprays into both nostrils daily as needed for allergies or rhinitis.  . hydrochlorothiazide (HYDRODIURIL) 12.5 MG tablet TAKE 1 TABLET BY MOUTH EVERY DAY  . LORazepam (ATIVAN) 0.5 MG tablet Take 1 tablet (0.5 mg total) by mouth every 12 (twelve) hours as needed for anxiety.  . meclizine (ANTIVERT) 25 MG tablet   . Multiple Vitamins-Minerals (ECHINACEA ACZ PO) Take 1 capsule by mouth as needed (for immune support).  . nebivolol (BYSTOLIC) 5 MG tablet Take 1 tablet (5 mg total) by mouth daily.  Vladimir Faster Glycol-Propyl Glycol (SYSTANE) 0.4-0.3 % SOLN Apply 1 drop to eye 2 (two) times a day.   . telmisartan (MICARDIS) 20 MG tablet TAKE 1 TABLET BY MOUTH EVERY DAY  . triamcinolone cream (KENALOG) 0.1 % Apply 1 application topically 2 (two) times daily as needed.  . vitamin E 400 UNIT capsule Take 400 Units by mouth daily.  . sertraline (ZOLOFT) 25 MG tablet TAKE 1 TABLET BY MOUTH EVERY DAY (Patient not taking: Reported on 04/18/2020)  . triamterene-hydrochlorothiazide (MAXZIDE-25) 37.5-25 MG tablet Take 1/2 tablet by mouth daily (Patient not taking: Reported on 04/18/2020)  . [  DISCONTINUED] Calcium Carbonate-Vitamin D (CALCIUM-VITAMIN D3 PO) Take 1 tablet by mouth every morning. 1200mg  of calcium   No facility-administered encounter medications on file as of 04/18/2020.    Allergies (verified) Codeine   History: Past Medical History:  Diagnosis Date  . Anemia    "in past as a young girl"  . Anxiety   . Arthritis   . Asthma   . Bronchitis    hx of  . Cataract    bilaterally, "no surgery at this time"  . Diabetes  mellitus without complication ([place])    pre diabetic  . Dysrhythmia    when gets excited  . GERD (gastroesophageal reflux disease)   . Headache(784.0)    hx of migraines  . History of hiatal hernia   . Hyperlipidemia   . Hypertension    sees Dr. Terrence Dupont  . Neuromuscular disorder ([place])    hx of carpal tunnel "never had surgery for"right hand   Past Surgical History:  Procedure Laterality Date  . CARDIAC CATHETERIZATION     5 years ago no problems  . COLONOSCOPY  07/2018   polypectomy x 5  . DILATATION & CURETTAGE/HYSTEROSCOPY WITH MYOSURE N/A 01/06/2018   Procedure: DILATATION & CURETTAGE/HYSTEROSCOPY WITH MYOSURE;  Surgeon: Servando Salina, MD;  Location: [facility];  Service: Gynecology;  Laterality: N/A;  . DOPPLER ECHOCARDIOGRAPHY     hx of  . NECK SURGERY     2002, cyst removed,   . removal of toe nails     both feet  . TOTAL HIP ARTHROPLASTY Right 08/08/2012   Dr Mayer Camel  . TOTAL HIP ARTHROPLASTY Right 08/08/2012   Procedure: TOTAL HIP ARTHROPLASTY;  Surgeon: Kerin Salen, MD;  Location: [facility];  Service: Orthopedics;  Laterality: Right;  . TUBAL LIGATION     1970   Family History  Problem Relation Age of Onset  . Heart attack Mother   . Heart attack Father   . Stroke Father   . Stroke Daughter   . Colon cancer Sister    Social History   Socioeconomic History  . Marital status: Divorced    Spouse name: Not on file  . Number of children: Not on file  . Years of education: Not on file  . Highest education level: Not on file  Occupational History  . Occupation: retired  Tobacco Use  . Smoking status: Never Smoker  . Smokeless tobacco: Never Used  Vaping Use  . Vaping Use: Never used  Substance and Sexual Activity  . Alcohol use: No  . Drug use: No  . Sexual activity: Not Currently  Other Topics Concern  . Not on file  Social History Narrative  . Not on file   Social Determinants of Health   Financial Resource Strain: Low Risk   . Difficulty of Paying  Living Expenses: Not hard at all  Food Insecurity: No Food Insecurity  . Worried About Charity fundraiser in the Last Year: Never true  . Ran Out of Food in the Last Year: Never true  Transportation Needs: No Transportation Needs  . Lack of Transportation (Medical): No  . Lack of Transportation (Non-Medical): No  Physical Activity: Insufficiently Active  . Days of Exercise per Week: 7 days  . Minutes of Exercise per Session: 20 min  Stress: No Stress Concern Present  . Feeling of Stress : Not at all  Social Connections:   . Frequency of Communication with Friends and Family: Not on file  . Frequency of Social Gatherings  with Friends and Family: Not on file  . Attends Religious Services: Not on file  . Active Member of Clubs or Organizations: Not on file  . Attends Archivist Meetings: Not on file  . Marital Status: Not on file    Tobacco Counseling Counseling given: Not Answered   Clinical Intake:  Pre-visit preparation completed: Yes  Pain : No/denies pain     Nutritional Status: BMI > 30  Obese Nutritional Risks: None Diabetes: No  How often do you need to have someone help you when you read instructions, pamphlets, or other written materials from your doctor or pharmacy?: 1 - Never What is the last grade level you completed in school?: some college  Diabetic? no  Interpreter Needed?: No  Information entered by :: NAllen :PN   Activities of Daily Living In your present state of health, do you have any difficulty performing the following activities: 04/18/2020  Hearing? N  Vision? N  Difficulty concentrating or making decisions? N  Walking or climbing stairs? N  Dressing or bathing? N  Doing errands, shopping? N  Preparing Food and eating ? N  Using the Toilet? N  In the past six months, have you accidently leaked urine? Y  Comment with laughing sometimes  Do you have problems with loss of bowel control? N  Managing your Medications? N    Managing your Finances? N  Housekeeping or managing your Housekeeping? N  Some recent data might be hidden    Patient Care Team: Glendale Chard, MD as PCP - General (Internal Medicine) Rutherford Guys, MD as Consulting Physician (Ophthalmology) Rex Kras, Claudette Stapler, RN as Case Manager  Indicate any recent Medical Services you may have received from other than [facility] providers in the past year (date may be approximate).     Assessment:   This is a routine wellness examination for [place].  Hearing/Vision screen  Hearing Screening   125Hz  250Hz  500Hz  1000Hz  2000Hz  3000Hz  4000Hz  6000Hz  8000Hz   Right ear:           Left ear:           Vision Screening Comments: Regular eye exams, Dr. Annamaria Boots  Dietary issues and exercise activities discussed: Current Exercise Habits: Home exercise routine, Type of exercise: calisthenics, Time (Minutes): 20, Frequency (Times/Week): 7, Weekly Exercise (Minutes/Week): 140  Goals    .  "I would like to keep my BP under good control' (pt-stated)      [place] (see longitudinal plan of care for additional care plan information)  Current Barriers:  Marland Kitchen Knowledge Deficits related to disease process and Self Health management of HTN . Chronic Disease Management support and education needs related to Asthma, HTN  Nurse Case Manager Clinical Goal(s):  Marland Kitchen Over the next 90 days, patient will work with CCM RN CM and PCP to address needs related to disease education and support to improve Self Health management of HTN  CCM RN CM Interventions:  10/10/19 call completed with patient  . Inter-disciplinary care team collaboration (see longitudinal plan of care) . Evaluation of current treatment plan related to HTN and patient's adherence to plan as established by provider. . Provided education to patient re: target BP <130/80, educated on importance of keeping BP well controlled to prevent potential complications; reviewed and discussed recent BP recorded with last OV   . Reviewed medications with patient and discussed indication, dosage and frequency of prescribed medications including recent changes, pt reports adherence  . Discussed plans with patient for ongoing  care management follow up and provided patient with direct contact information for care management team . Provided patient with printed  educational materials related to What is High Blood Pressure?; Why Should I Limit Sodium?; 6 Ways to be Water Wise; Life's Simple 7  Patient Self Care Activities:  . Self administers medications as prescribed . Attends all scheduled provider appointments . Calls pharmacy for medication refills . Performs ADL's independently . Performs IADL's independently . Calls provider office for new concerns or questions  Initial goal documentation     .  I would like to manage my asthma (pt-stated)      Current Barriers:  . Current asthma treatment regimen: Breo Ellipta . Denies current SOB/wheezing . Does not have rescue inhaler--will have called in . Patient currently on symptomatic control with flonase and anti-histamines . She is not a smoker . BP today 138/82, HR 72 . Currently manages anxiety with PRN alprazolam,  she has not been taking sertaline because it makes her feel different  Pharmacist Clinical Goal(s):  Marland Kitchen Over the next 90 days, patient with work with PharmD and primary care provider to address disease state management of asthma  Interventions: . Comprehensive medication review performed, medication list updated in electronic medical record . Counseled on appropriate use . Patient states she will consider patient assistance program, however she needs time to think about it  CARE PLAN ENTRY (see longitudinal plan of care for additional care plan information)  Current Barriers:  Marland Kitchen Knowledge Deficits related to disease process and Self Health management of Asthma . Chronic Disease Management support and education needs related to Asthma,  HTN  Nurse Case Manager Clinical Goal(s):  Marland Kitchen Over the next 90 days, patient will work with CCM RN CM and PCP  to address needs related to disease education and support to improve Self Health management of Asthma  CCM RN CM Interventions:  10/10/19 call completed with patient  . Inter-disciplinary care team collaboration (see longitudinal plan of care) . Evaluation of current treatment plan related to Asthma and patient's adherence to plan as established by provider . Determined patient's Asthma is very well controlled at this time, she occasionally has symptoms when outdoors too long . Reviewed medications with patient and discussed patient is having financial hardship paying for her Adair Patter; patient states her Allergist may have resolved her issue with high cost for this drug but she is unsure what interventions lead to the lower cost; Encouraged patient to notify the CCM team if pharmacy assistance is needed . Discussed plans with patient for ongoing care management follow up and provided patient with direct contact information for care management team  Patient Self Care Activities:  Patient will take medications as prescribed . Patient will contact provider with any episodes of SOB/wheezing . Patient will report any questions or concerns to provider   Please see past updates related to this goal by clicking on the "Past Updates" button in the selected goal      .  Patient Stated      04/12/2019, wants to work on strengthen back    .  Patient Stated      04/18/2020, wants to stay well    .  Weight (lb) < 200 lb (90.7 kg)      Patient would like to remain healthy      Depression Screen PHQ 2/9 Scores 04/18/2020 10/04/2019 04/12/2019 11/21/2018 10/18/2018 05/19/2018 04/06/2018  PHQ - 2 Score 1 0 0 0 0 0 0  PHQ-  9 Score 1 - 0 - - - -    Fall Risk Fall Risk  04/18/2020 10/04/2019 04/12/2019 11/21/2018 10/18/2018  Falls in the past year? 0 0 0 0 0  Comment - - - - -  Number falls in  past yr: - 0 0 - -  Injury with Fall? - 0 - - -  Risk for fall due to : Medication side effect - Medication side effect - -  Follow up Falls evaluation completed;Education provided;Falls prevention discussed - Falls evaluation completed;Education provided;Falls prevention discussed - -    Any stairs in or around the home? Yes  If so, are there any without handrails? No  Home free of loose throw rugs in walkways, pet beds, electrical cords, etc? Yes  Adequate lighting in your home to reduce risk of falls? Yes   ASSISTIVE DEVICES UTILIZED TO PREVENT FALLS:  Life alert? No  Use of a cane, walker or w/c? No  Grab bars in the bathroom? Yes  Shower chair or bench in shower? No  Elevated toilet seat or a handicapped toilet? No   TIMED UP AND GO:  Was the test performed? No .    Gait steady and fast without use of assistive device  Cognitive Function:     6CIT Screen 04/18/2020 04/12/2019 04/06/2018  What Year? 0 points 0 points 0 points  What month? 0 points 0 points 0 points  What time? 0 points 0 points 0 points  Count back from 20 0 points 0 points 0 points  Months in reverse 2 points 2 points 2 points  Repeat phrase 4 points 2 points 0 points  Total Score 6 4 2     Immunizations Immunization History  Administered Date(s) Administered  . Moderna SARS-COVID-2 Vaccination 07/23/2019, 08/21/2019  . Pneumococcal Polysaccharide-23 04/18/2020    TDAP status: Up to date Flu Vaccine status: Declined, Education has been provided regarding the importance of this vaccine but patient still declined. Advised may receive this vaccine at local pharmacy or Health Dept. Aware to provide a copy of the vaccination record if obtained from local pharmacy or Health Dept. Verbalized acceptance and understanding. Pneumococcal vaccine status: Up to date Covid-19 vaccine status: Completed vaccines  Qualifies for Shingles Vaccine? Yes   Zostavax completed No   Shingrix Completed?: No.    Education  has been provided regarding the importance of this vaccine. Patient has been advised to call insurance company to determine out of pocket expense if they have not yet received this vaccine. Advised may also receive vaccine at local pharmacy or Health Dept. Verbalized acceptance and understanding.  Screening Tests Health Maintenance  Topic Date Due  . INFLUENZA VACCINE  09/05/2020 (Originally 01/07/2020)  . MAMMOGRAM  04/03/2021  . TETANUS/TDAP  04/01/2028  . DEXA SCAN  Completed  . COVID-19 Vaccine  Completed  . PNA vac Low Risk Adult  Completed    Health Maintenance  There are no preventive care reminders to display for this patient.  Colorectal cancer screening: No longer required.  Mammogram status: Completed 04/03/2020. Repeat every year Bone Density status: Completed 04/03/2020.   Lung Cancer Screening: (Low Dose CT Chest recommended if Age 65-80 years, 30 pack-year currently smoking OR have quit w/in 15years.) does not qualify.   Lung Cancer Screening Referral: no  Additional Screening:  Hepatitis C Screening: does not qualify;  Vision Screening: Recommended annual ophthalmology exams for early detection of glaucoma and other disorders of the eye. Is the patient up to date with their annual  eye exam?  Yes  Who is the provider or what is the name of the office in which the patient attends annual eye exams? Dr. Annamaria Boots If pt is not established with a provider, would they like to be referred to a provider to establish care? No .   Dental Screening: Recommended annual dental exams for proper oral hygiene  Community Resource Referral / Chronic Care Management: CRR required this visit?  No   CCM required this visit?  No      Plan:     I have personally reviewed and noted the following in the patient's chart:   . Medical and social history . Use of alcohol, tobacco or illicit drugs  . Current medications and supplements . Functional ability and status . Nutritional  status . Physical activity . Advanced directives . List of other physicians . Hospitalizations, surgeries, and ER visits in previous 12 months . Vitals . Screenings to include cognitive, depression, and falls . Referrals and appointments  In addition, I have reviewed and discussed with patient certain preventive protocols, quality metrics, and best practice recommendations. A written personalized care plan for preventive services as well as general preventive health recommendations were provided to patient.     Kellie Simmering, LPN   [id number]   Nurse Notes:

## 2020-04-19 LAB — CMP14+EGFR
ALT: 13 IU/L (ref 0–32)
AST: 20 IU/L (ref 0–40)
Albumin/Globulin Ratio: 1.4 (ref 1.2–2.2)
Albumin: 4.3 g/dL (ref 3.6–4.6)
Alkaline Phosphatase: 70 IU/L (ref 44–121)
BUN/Creatinine Ratio: 14 (ref 12–28)
BUN: 11 mg/dL (ref 8–27)
Bilirubin Total: 0.4 mg/dL (ref 0.0–1.2)
CO2: 26 mmol/L (ref 20–29)
Calcium: 9.1 mg/dL (ref 8.7–10.3)
Chloride: 100 mmol/L (ref 96–106)
Creatinine, Ser: 0.78 mg/dL (ref 0.57–1.00)
GFR calc Af Amer: 78 mL/min/1.73 (ref >59)
GFR calc non Af Amer: 68 mL/min/1.73 (ref >59)
Globulin, Total: 3.1 g/dL (ref 1.5–4.5)
Glucose: 94 mg/dL (ref 65–99)
Potassium: 4.3 mmol/L (ref 3.5–5.2)
Sodium: 140 mmol/L (ref 134–144)
Total Protein: 7.4 g/dL (ref 6.0–8.5)

## 2020-04-19 LAB — LIPID PANEL
Chol/HDL Ratio: 2.4 ratio (ref 0.0–4.4)
Cholesterol, Total: 144 mg/dL (ref 100–199)
HDL: 59 mg/dL (ref >39)
LDL Chol Calc (NIH): 69 mg/dL (ref 0–99)
Triglycerides: 83 mg/dL (ref 0–149)
VLDL Cholesterol Cal: 16 mg/dL (ref 5–40)

## 2020-04-19 LAB — HEMOGLOBIN A1C
Est. average glucose Bld gHb Est-mCnc: 117 mg/dL
Hgb A1c MFr Bld: 5.7 % — ABNORMAL HIGH (ref 4.8–5.6)

## 2020-04-24 ENCOUNTER — Telehealth: Payer: Self-pay | Admitting: Dermatology

## 2020-04-24 NOTE — Telephone Encounter (Signed)
Referral. Can't wait until April; will try elsewhere

## 2020-04-25 ENCOUNTER — Other Ambulatory Visit: Payer: Self-pay

## 2020-04-25 ENCOUNTER — Other Ambulatory Visit: Payer: Medicare Other

## 2020-04-25 ENCOUNTER — Telehealth: Payer: Medicare Other

## 2020-04-25 DIAGNOSIS — Z79899 Other long term (current) drug therapy: Secondary | ICD-10-CM | POA: Diagnosis not present

## 2020-04-25 LAB — CBC
Hematocrit: 40.8 % (ref 34.0–46.6)
Hemoglobin: 13.1 g/dL (ref 11.1–15.9)
MCH: 28.9 pg (ref 26.6–33.0)
MCHC: 32.1 g/dL (ref 31.5–35.7)
MCV: 90 fL (ref 79–97)
Platelets: 253 10*3/uL (ref 150–450)
RBC: 4.53 x10E6/uL (ref 3.77–5.28)
RDW: 12.4 % (ref 11.7–15.4)
WBC: 5.1 10*3/uL (ref 3.4–10.8)

## 2020-04-29 ENCOUNTER — Telehealth: Payer: Self-pay

## 2020-04-29 ENCOUNTER — Telehealth: Payer: Medicare Other

## 2020-04-29 NOTE — Telephone Encounter (Cosign Needed)
  Chronic Care Management   Outreach Note  04/29/2020 Name: Paula Pollard MRN: [id number] DOB: [birth year]  Referred by: Glendale Chard, MD Reason for referral : Chronic Care Management (RNCM FU Call )   An unsuccessful telephone outreach was attempted today. The patient was referred to the case management team for assistance with care management and care coordination.   Follow Up Plan: A HIPAA compliant phone message was left for the patient providing contact information and requesting a return call. Telephone follow up appointment with care management team member scheduled for: 05/23/20  Barb Merino, RN, BSN, CCM Care Management Coordinator [facility]  Direct Phone: [phone]

## 2020-05-09 DIAGNOSIS — R0789 Other chest pain: Secondary | ICD-10-CM | POA: Diagnosis not present

## 2020-05-09 DIAGNOSIS — I2510 Atherosclerotic heart disease of native coronary artery without angina pectoris: Secondary | ICD-10-CM | POA: Diagnosis not present

## 2020-05-09 DIAGNOSIS — E559 Vitamin D deficiency, unspecified: Secondary | ICD-10-CM | POA: Diagnosis not present

## 2020-05-09 DIAGNOSIS — J45909 Unspecified asthma, uncomplicated: Secondary | ICD-10-CM | POA: Diagnosis not present

## 2020-05-09 DIAGNOSIS — I5030 Unspecified diastolic (congestive) heart failure: Secondary | ICD-10-CM | POA: Diagnosis not present

## 2020-05-09 DIAGNOSIS — E785 Hyperlipidemia, unspecified: Secondary | ICD-10-CM | POA: Diagnosis not present

## 2020-05-09 DIAGNOSIS — I10 Essential (primary) hypertension: Secondary | ICD-10-CM | POA: Diagnosis not present

## 2020-05-09 DIAGNOSIS — I38 Endocarditis, valve unspecified: Secondary | ICD-10-CM | POA: Diagnosis not present

## 2020-05-14 DIAGNOSIS — Z23 Encounter for immunization: Secondary | ICD-10-CM | POA: Diagnosis not present

## 2020-05-15 ENCOUNTER — Other Ambulatory Visit: Payer: Medicare Other

## 2020-05-15 ENCOUNTER — Other Ambulatory Visit: Payer: Self-pay

## 2020-05-15 ENCOUNTER — Other Ambulatory Visit (INDEPENDENT_AMBULATORY_CARE_PROVIDER_SITE_OTHER): Payer: Medicare Other

## 2020-05-15 DIAGNOSIS — R3129 Other microscopic hematuria: Secondary | ICD-10-CM | POA: Diagnosis not present

## 2020-05-15 LAB — POCT URINALYSIS DIPSTICK
Bilirubin, UA: NEGATIVE
Glucose, UA: NEGATIVE
Ketones, UA: NEGATIVE
Nitrite, UA: NEGATIVE
Protein, UA: NEGATIVE
Spec Grav, UA: 1.015 (ref 1.010–1.025)
Urobilinogen, UA: 0.2 E.U./dL
pH, UA: 7 (ref 5.0–8.0)

## 2020-05-20 DIAGNOSIS — H25813 Combined forms of age-related cataract, bilateral: Secondary | ICD-10-CM | POA: Diagnosis not present

## 2020-05-21 DIAGNOSIS — L814 Other melanin hyperpigmentation: Secondary | ICD-10-CM | POA: Diagnosis not present

## 2020-05-21 DIAGNOSIS — D485 Neoplasm of uncertain behavior of skin: Secondary | ICD-10-CM | POA: Diagnosis not present

## 2020-05-21 DIAGNOSIS — L821 Other seborrheic keratosis: Secondary | ICD-10-CM | POA: Diagnosis not present

## 2020-05-23 ENCOUNTER — Other Ambulatory Visit: Payer: Self-pay

## 2020-05-23 ENCOUNTER — Telehealth: Payer: Self-pay

## 2020-05-23 ENCOUNTER — Ambulatory Visit: Payer: Self-pay

## 2020-05-23 ENCOUNTER — Telehealth: Payer: Medicare Other

## 2020-05-23 DIAGNOSIS — I129 Hypertensive chronic kidney disease with stage 1 through stage 4 chronic kidney disease, or unspecified chronic kidney disease: Secondary | ICD-10-CM

## 2020-05-23 DIAGNOSIS — R7309 Other abnormal glucose: Secondary | ICD-10-CM

## 2020-05-23 DIAGNOSIS — R42 Dizziness and giddiness: Secondary | ICD-10-CM

## 2020-05-23 DIAGNOSIS — J45901 Unspecified asthma with (acute) exacerbation: Secondary | ICD-10-CM

## 2020-05-23 NOTE — Telephone Encounter (Signed)
Patient is having problems with Vertigo. It started on yesterday. She is having ear pain and very dizzy. She called her ENT but they were unable to get her in. Patient is unable to drive to come in and see [place].  She has tried a heating pad on her ear and face. Patient states the nasal sprays that she is on are not helping her: Dymista  Flonase    Please Advise   [facility]

## 2020-05-24 NOTE — Telephone Encounter (Signed)
I would recommend that she use her meclizine every 8 hours for the next few days.  I would also make sure that she is checking her blood pressure so that we can make sure that her blood pressure is not an issue here.  Salvatore Marvel, MD [facility] [place] [facility] [place]

## 2020-05-24 NOTE — Telephone Encounter (Signed)
Left voicemail for patient to return call.

## 2020-05-24 NOTE — Telephone Encounter (Signed)
Patient returned my call and states she is feeling a little better. Her blood pressure was running high but it's back to normal today. She states she still has right ear pain and states prednisone has helped in the past for this issue and would like it called in. Please advice

## 2020-05-27 ENCOUNTER — Telehealth: Payer: Self-pay

## 2020-05-27 NOTE — Telephone Encounter (Signed)
I called the pt and told her that Dr. Baird Cancer wanted to know if the pt went to her dermatology referral on the 14th with Dr. Ubaldo Glassing.  The pt said yes and that Dr. Ubaldo Glassing said that the scarring was just old age on the pt's face.  The pt wanted to know how her urine results was from her visit and to let Dr Baird Cancer know that she made an appt with Dr. Benjamine Mola for vertigo for the 28th because the pt has been having dizziness.

## 2020-05-28 MED ORDER — PREDNISONE 10 MG PO TABS: ORAL_TABLET | ORAL | 0 refills | Status: DC

## 2020-05-28 NOTE — Telephone Encounter (Signed)
Left a detail message to notify patient [facility].

## 2020-05-28 NOTE — Telephone Encounter (Signed)
I am fine with that, especially since it is before the holidays. Prednisone pended. Please confirm pharmacy and sign on my behalf.  Salvatore Marvel, MD [facility] [place] [facility] [place]

## 2020-05-28 NOTE — Chronic Care Management (AMB) (Signed)
Chronic Care Management   Follow Up Note   05/23/2020 Name: Paula Pollard MRN: [id number] DOB: 15-Dec-1929  Referred by: Glendale Chard, MD Reason for referral : Chronic Care Management (RN CM FU Call )   Paula Pollard is a 86 y.o. year old female who is a primary care patient of Glendale Chard, MD. The CCM team was consulted for assistance with chronic disease management and care coordination needs.    Review of patient status, including review of consultants reports, relevant laboratory and other test results, and collaboration with appropriate care team members and the patient's provider was performed as part of comprehensive patient evaluation and provision of chronic care management services.    SDOH (Social Determinants of Health) assessments performed: Yes - no acute needs identified See Care Plan activities for detailed interventions related to [place])   Placed outbound CCM RN CM follow up call to patient for a care plan update.     Outpatient Encounter Medications as of 05/23/2020  Medication Sig  . albuterol (VENTOLIN HFA) 108 (90 Base) MCG/ACT inhaler Inhale 2 puffs into the lungs every 6 (six) hours as needed for wheezing or shortness of breath.  . Ascorbic Acid (VITAMIN C PO) Take 1 tablet by mouth daily.   Marland Kitchen aspirin EC 81 MG tablet Take 81 mg by mouth daily.  Marland Kitchen atorvastatin (LIPITOR) 40 MG tablet Take 40 mg by mouth daily.  . Azelastine-Fluticasone (DYMISTA) 137-50 MCG/ACT SUSP Place 1 spray into both nostrils 2 (two) times daily as needed.  . Calcium 200 MG TABS Take 200 mg by mouth daily.  . cholecalciferol (VITAMIN D) 400 units TABS tablet Take 1,200 Units by mouth daily.  . Cyanocobalamin (B-12 PO) Take 1 tablet by mouth as needed (for energy).  Marland Kitchen esomeprazole (NEXIUM) 20 MG capsule Take 20 mg by mouth as needed (for heartburn).  . fluticasone (FLONASE) 50 MCG/ACT nasal spray Place 2 sprays into both nostrils daily as needed for allergies or rhinitis.  .  hydrochlorothiazide (HYDRODIURIL) 12.5 MG tablet TAKE 1 TABLET BY MOUTH EVERY DAY  . LORazepam (ATIVAN) 0.5 MG tablet Take 1 tablet (0.5 mg total) by mouth every 12 (twelve) hours as needed for anxiety.  . meclizine (ANTIVERT) 25 MG tablet   . Multiple Vitamins-Minerals (ECHINACEA ACZ PO) Take 1 capsule by mouth as needed (for immune support).  . nebivolol (BYSTOLIC) 5 MG tablet Take 1 tablet (5 mg total) by mouth daily.  Vladimir Faster Glycol-Propyl Glycol (SYSTANE) 0.4-0.3 % SOLN Apply 1 drop to eye 2 (two) times a day.   . sertraline (ZOLOFT) 25 MG tablet TAKE 1 TABLET BY MOUTH EVERY DAY (Patient not taking: Reported on 04/18/2020)  . telmisartan (MICARDIS) 20 MG tablet TAKE 1 TABLET BY MOUTH EVERY DAY  . triamcinolone cream (KENALOG) 0.1 % Apply 1 application topically 2 (two) times daily as needed.  . triamterene-hydrochlorothiazide (MAXZIDE-25) 37.5-25 MG tablet Take 1/2 tablet by mouth daily (Patient not taking: Reported on 04/18/2020)  . vitamin E 400 UNIT capsule Take 400 Units by mouth daily.   No facility-administered encounter medications on file as of 05/23/2020.     Objective:  Lab Results  Component Value Date   HGBA1C 5.7 (H) 04/18/2020   HGBA1C 5.9 (H) 08/10/2019   HGBA1C 6.1 (H) 04/12/2019   Lab Results  Component Value Date   MICROALBUR 10 04/18/2020   LDLCALC 69 04/18/2020   CREATININE 0.78 04/18/2020   BP Readings from Last 3 Encounters:  04/18/20 (!) 142/90  04/18/20 Marland Kitchen)  142/90  03/12/20 132/70    Goals Addressed      Patient Stated   .  "I would like to keep my BP under good control' (pt-stated)   On track     [place] (see longitudinal plan of care for additional care plan information)  Current Barriers:  Marland Kitchen Knowledge Deficits related to disease process and Self Health management of HTN . Chronic Disease Management support and education needs related to Asthma, HTN Nurse Case Manager Clinical Goal(s):  Marland Kitchen Over the next 90 days, patient will work  with CCM RN CM and PCP to address needs related to disease education and support to improve Self Health management of HTN CCM RN CM Interventions:  05/22/20 call completed with patient  . Inter-disciplinary care team collaboration (see longitudinal plan of care) . Evaluation of current treatment plan related to HTN and patient's adherence to plan as established by provider. . Provided education to patient re: target BP <130/80, educated on importance of keeping BP well controlled to prevent potential complications; reviewed and discussed recent BP recorded with last OV  . Reviewed medications with patient and discussed indication, dosage and frequency of prescribed medications including recent changes, pt reports adherence  . Discussed plans with patient for ongoing care management follow up and provided patient with direct contact information for care management team . Confirmed patient received and reviewed printed educational materials related to What is High Blood Pressure?; Why Should I Limit Sodium?; 6 Ways to be Water Wise; Life's Simple 7  Patient Self Care Activities:  . Continue to monitor BP at home at least 3 times daily . Report abnormal BP readings to MD promptly  . Self administers medications as prescribed . Attend all scheduled provider appointments . Call pharmacy for medication refills . Performs ADL's independently . Performs IADL's independently . Calls provider office for new concerns or questions  Please see past updates related to this goal by clicking on the "Past Updates" button in the selected goal      Patient Care Plan: Manage Vertigo    Problem Identified: Disease Recurrence   Priority: High    Goal: Disease Recurrence Monitored - Vertigo   Start Date: 05/23/2020  Expected End Date: 07/08/2020  This Visit's Progress: On track  Priority: High  Note:   Current Barriers:   Ineffective Self Health Maintenance  Currently UNABLE TO independently self  manage needs related to chronic health conditions.   Knowledge Deficits related to short term plan for care coordination needs and long term plans for chronic disease management needs Nurse Case Manager Clinical Goal(s):   Over the next 90 days, patient will work with care management team to address care coordination and chronic disease management needs related to Disease Management  Educational Needs  Care Coordination  Medication Management and Education  Psychosocial Support   Interventions:  . Determined patient is experiencing reoccurring Vertigo . Assessed for potential triggers that may exacerbate this condition . Reviewed prescribed medications that treat this condition, assessed for adherence and effectiveness . Encouraged patient to contact Dr. Benjamine Mola to schedule an appointment for re-evaluation and treatment of this condition as soon as possible . Encouraged patient to balance her activity with rest and to restrain from driving until this condition has improved or resolved Patient Goals/Self Care Activities Over the next 45 days, patient will:  -patient will keep f/u appointment with Dr. Benjamine Mola as scheduled for evaluation and treatment of Vertigo -patient will take her prescribed medication to help  improve Vertigo -patient will balance her activity with rest and will restrain from driving while suffering from Vertigo  Follow Up Plan: Telephone follow up appointment with care management team member scheduled for: 07/04/20    Plan:   Telephone follow up appointment with care management team member scheduled for: 07/04/20  Barb Merino, RN, BSN, CCM Care Management Coordinator [place] [facility]  Direct Phone: [phone]

## 2020-05-28 NOTE — Patient Instructions (Signed)
Visit Information  Goals Addressed      Patient Stated   .  "I would like to keep my BP under good control' (pt-stated)   On track     [place] (see longitudinal plan of care for additional care plan information)  Current Barriers:  Marland Kitchen Knowledge Deficits related to disease process and Self Health management of HTN . Chronic Disease Management support and education needs related to Asthma, HTN Nurse Case Manager Clinical Goal(s):  Marland Kitchen Over the next 90 days, patient will work with CCM RN CM and PCP to address needs related to disease education and support to improve Self Health management of HTN CCM RN CM Interventions:  05/22/20 call completed with patient  . Inter-disciplinary care team collaboration (see longitudinal plan of care) . Evaluation of current treatment plan related to HTN and patient's adherence to plan as established by provider. . Provided education to patient re: target BP <130/80, educated on importance of keeping BP well controlled to prevent potential complications; reviewed and discussed recent BP recorded with last OV  . Reviewed medications with patient and discussed indication, dosage and frequency of prescribed medications including recent changes, pt reports adherence  . Discussed plans with patient for ongoing care management follow up and provided patient with direct contact information for care management team . Confirmed patient received and reviewed printed educational materials related to What is High Blood Pressure?; Why Should I Limit Sodium?; 6 Ways to be Water Wise; Life's Simple 7  Patient Self Care Activities:  . Continue to monitor BP at home at least 3 times daily . Report abnormal BP readings to MD promptly  . Self administers medications as prescribed . Attend all scheduled provider appointments . Call pharmacy for medication refills . Performs ADL's independently . Performs IADL's independently . Calls provider office for new concerns or  questions  Please see past updates related to this goal by clicking on the "Past Updates" button in the selected goal        Other   .  Treat and Manage Vertigo        Timeframe:  Short-Term Goal Priority:  High Start Date:  05/23/20                           Expected End Date:  07/08/20                     Follow up date: 07/04/20  Over the next 45 days, patient will:  -patient will keep f/u appointment with Dr. Benjamine Mola as scheduled for evaluation and treatment of Vertigo -patient will take her prescribed medication to help improve Vertigo -patient will balance her activity with rest and will restrain from driving while suffering from Vertigo       The patient verbalized understanding of instructions, educational materials, and care plan provided today and declined offer to receive copy of patient instructions, educational materials, and care plan.   Telephone follow up appointment with care management team member scheduled for: 07/04/20  Lynne Logan, RN

## 2020-06-04 DIAGNOSIS — R42 Dizziness and giddiness: Secondary | ICD-10-CM | POA: Diagnosis not present

## 2020-06-04 DIAGNOSIS — H8111 Benign paroxysmal vertigo, right ear: Secondary | ICD-10-CM | POA: Diagnosis not present

## 2020-06-04 DIAGNOSIS — H9201 Otalgia, right ear: Secondary | ICD-10-CM | POA: Diagnosis not present

## 2020-07-04 ENCOUNTER — Telehealth: Payer: Medicare Other

## 2020-07-04 ENCOUNTER — Telehealth: Payer: Self-pay

## 2020-07-04 NOTE — Telephone Encounter (Cosign Needed)
   07/04/2020  Paula Pollard [birth year] [id number]   An unsuccessful telephone outreach was attempted today. The patient was referred to the case management team for assistance with care management and care coordination.   Follow Up Plan: A HIPAA compliant phone message was left for the patient providing contact information and requesting a return call. Telephone follow up appointment with care management team member scheduled for: 08/13/20  Barb Merino, RN, BSN, CCM Care Management Coordinator [place] [facility]  Direct Phone: [phone]

## 2020-08-08 DIAGNOSIS — E559 Vitamin D deficiency, unspecified: Secondary | ICD-10-CM | POA: Diagnosis not present

## 2020-08-08 DIAGNOSIS — E785 Hyperlipidemia, unspecified: Secondary | ICD-10-CM | POA: Diagnosis not present

## 2020-08-08 DIAGNOSIS — I2510 Atherosclerotic heart disease of native coronary artery without angina pectoris: Secondary | ICD-10-CM | POA: Diagnosis not present

## 2020-08-08 DIAGNOSIS — I5030 Unspecified diastolic (congestive) heart failure: Secondary | ICD-10-CM | POA: Diagnosis not present

## 2020-08-08 DIAGNOSIS — I10 Essential (primary) hypertension: Secondary | ICD-10-CM | POA: Diagnosis not present

## 2020-08-08 DIAGNOSIS — I38 Endocarditis, valve unspecified: Secondary | ICD-10-CM | POA: Diagnosis not present

## 2020-08-08 DIAGNOSIS — J45909 Unspecified asthma, uncomplicated: Secondary | ICD-10-CM | POA: Diagnosis not present

## 2020-08-12 ENCOUNTER — Ambulatory Visit (INDEPENDENT_AMBULATORY_CARE_PROVIDER_SITE_OTHER): Payer: Medicare Other | Admitting: Internal Medicine

## 2020-08-12 ENCOUNTER — Other Ambulatory Visit: Payer: Self-pay

## 2020-08-12 ENCOUNTER — Encounter: Payer: Self-pay | Admitting: Internal Medicine

## 2020-08-12 VITALS — BP 134/86 | HR 76 | Temp 98.2°F | Ht 61.6 in | Wt 176.4 lb

## 2020-08-12 DIAGNOSIS — Z79899 Other long term (current) drug therapy: Secondary | ICD-10-CM

## 2020-08-12 DIAGNOSIS — N182 Chronic kidney disease, stage 2 (mild): Secondary | ICD-10-CM

## 2020-08-12 DIAGNOSIS — I129 Hypertensive chronic kidney disease with stage 1 through stage 4 chronic kidney disease, or unspecified chronic kidney disease: Secondary | ICD-10-CM

## 2020-08-12 DIAGNOSIS — R7309 Other abnormal glucose: Secondary | ICD-10-CM | POA: Diagnosis not present

## 2020-08-12 DIAGNOSIS — Z6832 Body mass index (BMI) 32.0-32.9, adult: Secondary | ICD-10-CM | POA: Diagnosis not present

## 2020-08-12 DIAGNOSIS — E6609 Other obesity due to excess calories: Secondary | ICD-10-CM | POA: Diagnosis not present

## 2020-08-12 NOTE — Patient Instructions (Signed)
Diabetes Mellitus and Foot Care Foot care is an important part of your health, especially when you have diabetes. Diabetes may cause you to have problems because of poor blood flow (circulation) to your feet and legs, which can cause your skin to:  Become thinner and drier.  Break more easily.  Heal more slowly.  Peel and crack. You may also have nerve damage (neuropathy) in your legs and feet, causing decreased feeling in them. This means that you may not notice minor injuries to your feet that could lead to more serious problems. Noticing and addressing any potential problems early is the best way to prevent future foot problems. How to care for your feet Foot hygiene  Wash your feet daily with warm water and mild soap. Do not use hot water. Then, pat your feet and the areas between your toes until they are completely dry. Do not soak your feet as this can dry your skin.  Trim your toenails straight across. Do not dig under them or around the cuticle. File the edges of your nails with an emery board or nail file.  Apply a moisturizing lotion or petroleum jelly to the skin on your feet and to dry, brittle toenails. Use lotion that does not contain alcohol and is unscented. Do not apply lotion between your toes.   Shoes and socks  Wear clean socks or stockings every day. Make sure they are not too tight. Do not wear knee-high stockings since they may decrease blood flow to your legs.  Wear shoes that fit properly and have enough cushioning. Always look in your shoes before you put them on to be sure there are no objects inside.  To break in new shoes, wear them for just a few hours a day. This prevents injuries on your feet. Wounds, scrapes, corns, and calluses  Check your feet daily for blisters, cuts, bruises, sores, and redness. If you cannot see the bottom of your feet, use a mirror or ask someone for help.  Do not cut corns or calluses or try to remove them with medicine.  If you  find a minor scrape, cut, or break in the skin on your feet, keep it and the skin around it clean and dry. You may clean these areas with mild soap and water. Do not clean the area with peroxide, alcohol, or iodine.  If you have a wound, scrape, corn, or callus on your foot, look at it several times a day to make sure it is healing and not infected. Check for: ? Redness, swelling, or pain. ? Fluid or blood. ? Warmth. ? Pus or a bad smell.   General tips  Do not cross your legs. This may decrease blood flow to your feet.  Do not use heating pads or hot water bottles on your feet. They may burn your skin. If you have lost feeling in your feet or legs, you may not know this is happening until it is too late.  Protect your feet from hot and cold by wearing shoes, such as at the beach or on hot pavement.  Schedule a complete foot exam at least once a year (annually) or more often if you have foot problems. Report any cuts, sores, or bruises to your health care provider immediately. Where to find more information  American Diabetes Association: www.diabetes.org  Association of Diabetes Care & Education Specialists: www.diabeteseducator.org Contact a health care provider if:  You have a medical condition that increases your risk of infection and   you have any cuts, sores, or bruises on your feet.  You have an injury that is not healing.  You have redness on your legs or feet.  You feel burning or tingling in your legs or feet.  You have pain or cramps in your legs and feet.  Your legs or feet are numb.  Your feet always feel cold.  You have pain around any toenails. Get help right away if:  You have a wound, scrape, corn, or callus on your foot and: ? You have pain, swelling, or redness that gets worse. ? You have fluid or blood coming from the wound, scrape, corn, or callus. ? Your wound, scrape, corn, or callus feels warm to the touch. ? You have pus or a bad smell coming from  the wound, scrape, corn, or callus. ? You have a fever. ? You have a red line going up your leg. Summary  Check your feet every day for blisters, cuts, bruises, sores, and redness.  Apply a moisturizing lotion or petroleum jelly to the skin on your feet and to dry, brittle toenails.  Wear shoes that fit properly and have enough cushioning.  If you have foot problems, report any cuts, sores, or bruises to your health care provider immediately.  Schedule a complete foot exam at least once a year (annually) or more often if you have foot problems. This information is not intended to replace advice given to you by your health care provider. Make sure you discuss any questions you have with your health care provider. Document Revised: 12/14/2019 Document Reviewed: 12/14/2019 Elsevier Patient Education  2021 Elsevier Inc.  

## 2020-08-12 NOTE — Progress Notes (Signed)
I,Paula Pollard,acting as a Education administrator for Paula Greenland, MD.,have documented all relevant documentation on the behalf of Paula Greenland, MD,as directed by  Paula Greenland, MD while in the presence of Paula Greenland, MD.  This visit occurred during the SARS-CoV-2 public health emergency.  Safety protocols were in place, including screening questions prior to the visit, additional usage of staff PPE, and extensive cleaning of exam room while observing appropriate contact time as indicated for disinfecting solutions.  Subjective:     Patient ID: Paula Pollard , female    DOB: Sep 25, 1929 , 87 y.o.   MRN: [id number]   Chief Complaint  Patient presents with  . Hypertension    HPI  She is here today for BP check. She reports compliance with meds. She denies headaches, chest pain and shortness of breath. She denies heaaches, chest pain and shortness of breath.   Hypertension This is a chronic problem. The current episode started more than 1 year ago. The problem has been gradually improving since onset. The problem is controlled. Pertinent negatives include no blurred vision, chest pain, palpitations or shortness of breath. Risk factors for coronary artery disease include sedentary lifestyle, obesity, stress and post-menopausal state. There are no compliance problems.  Hypertensive end-organ damage includes kidney disease.     Past Medical History:  Diagnosis Date  . Anemia    "in past as a young girl"  . Anxiety   . Arthritis   . Asthma   . Bronchitis    hx of  . Cataract    bilaterally, "no surgery at this time"  . Diabetes mellitus without complication ([place])    pre diabetic  . Dysrhythmia    when gets excited  . GERD (gastroesophageal reflux disease)   . Headache(784.0)    hx of migraines  . History of hiatal hernia   . Hyperlipidemia   . Hypertension    sees Dr. Terrence Dupont  . Neuromuscular disorder (HCC)    hx of carpal tunnel "never had surgery for"right hand      Family History  Problem Relation Age of Onset  . Heart attack Mother   . Heart attack Father   . Stroke Father   . Stroke Daughter   . Colon cancer Sister      Current Outpatient Medications:  .  albuterol (VENTOLIN HFA) 108 (90 Base) MCG/ACT inhaler, Inhale 2 puffs into the lungs every 6 (six) hours as needed for wheezing or shortness of breath., Disp: 18 g, Rfl: 1 .  Ascorbic Acid (VITAMIN C PO), Take 1 tablet by mouth daily., Disp: , Rfl:  .  aspirin EC 81 MG tablet, Take 81 mg by mouth daily., Disp: , Rfl:  .  atorvastatin (LIPITOR) 40 MG tablet, Take 40 mg by mouth daily., Disp: , Rfl:  .  Azelastine-Fluticasone (DYMISTA) 137-50 MCG/ACT SUSP, Place 1 spray into both nostrils 2 (two) times daily as needed., Disp: 23 g, Rfl: 5 .  Calcium 200 MG TABS, Take 200 mg by mouth daily., Disp: , Rfl:  .  cholecalciferol (VITAMIN D) 400 units TABS tablet, Take 1,200 Units by mouth daily., Disp: , Rfl:  .  Cyanocobalamin (B-12 PO), Take 1 tablet by mouth as needed (for energy)., Disp: , Rfl:  .  esomeprazole (NEXIUM) 20 MG capsule, Take 20 mg by mouth as needed (for heartburn)., Disp: , Rfl:  .  fluticasone (FLONASE) 50 MCG/ACT nasal spray, Place 2 sprays into both nostrils daily as needed for allergies or rhinitis.,  Disp: 18.2 g, Rfl: 5 .  hydrochlorothiazide (HYDRODIURIL) 12.5 MG tablet, TAKE 1 TABLET BY MOUTH EVERY DAY, Disp: 90 tablet, Rfl: 1 .  LORazepam (ATIVAN) 0.5 MG tablet, Take 1 tablet (0.5 mg total) by mouth every 12 (twelve) hours as needed for anxiety., Disp: 30 tablet, Rfl: 0 .  meclizine (ANTIVERT) 25 MG tablet, , Disp: , Rfl:  .  Multiple Vitamins-Minerals (ECHINACEA ACZ PO), Take 1 capsule by mouth as needed (for immune support)., Disp: , Rfl:  .  nebivolol (BYSTOLIC) 5 MG tablet, Take 1 tablet (5 mg total) by mouth daily., Disp: 30 tablet, Rfl: 0 .  Polyethyl Glycol-Propyl Glycol 0.4-0.3 % SOLN, Apply 1 drop to eye 2 (two) times a day. , Disp: , Rfl:  .  telmisartan  (MICARDIS) 20 MG tablet, TAKE 1 TABLET BY MOUTH EVERY DAY, Disp: 90 tablet, Rfl: 1 .  vitamin E 400 UNIT capsule, Take 400 Units by mouth daily., Disp: , Rfl:  .  sertraline (ZOLOFT) 25 MG tablet, TAKE 1 TABLET BY MOUTH EVERY DAY (Patient not taking: Reported on 08/12/2020), Disp: 90 tablet, Rfl: 1 .  triamcinolone cream (KENALOG) 0.1 %, Apply 1 application topically 2 (two) times daily as needed., Disp: 30 g, Rfl: 1   Allergies  Allergen Reactions  . Codeine Nausea Only and Other (See Comments)    Reaction:Dizziness and hallucinations "makes me climb walls"     Review of Systems  Constitutional: Negative.   Eyes: Negative for blurred vision.  Respiratory: Negative.  Negative for shortness of breath.   Cardiovascular: Negative.  Negative for chest pain and palpitations.  Gastrointestinal: Negative.   Psychiatric/Behavioral: Negative.   All other systems reviewed and are negative.    Today's Vitals   08/12/20 1420  BP: 134/86  Pulse: 76  Temp: 98.2 F (36.8 C)  TempSrc: Oral  Weight: 176 lb 6.4 oz (80 kg)  Height: 5' 1.6" (1.565 m)  PainSc: 0-No pain   Body mass index is 32.68 kg/m.  Wt Readings from Last 3 Encounters:  08/12/20 176 lb 6.4 oz (80 kg)  04/18/20 173 lb (78.5 kg)  04/18/20 173 lb 9.6 oz (78.7 kg)   Objective:  Physical Exam Vitals and nursing note reviewed.  Constitutional:      Appearance: Normal appearance. She is obese.  HENT:     Head: Normocephalic and atraumatic.     Nose:     Comments: Masked     Mouth/Throat:     Comments: Masked  Cardiovascular:     Rate and Rhythm: Normal rate and regular rhythm.     Heart sounds: Normal heart sounds.  Pulmonary:     Breath sounds: Normal breath sounds.  Skin:    General: Skin is warm.  Neurological:     General: No focal deficit present.     Mental Status: She is alert and oriented to person, place, and time.         Assessment And Plan:     1. Hypertensive nephropathy Comments: Chronic, fair  control. She will c/w current meds - telmisartan/hctz/Bystolic. Advised to follow a low salt diet as well. - BMP8+EGFR  2. Chronic renal disease, stage II Comments: Chronic, I will check GFR, Cr today.  She is encouraged to stay well hydrated.   3. Other abnormal glucose Comments: Her a1c has been elevated in the past. She is encouraged to limit her intake of sugary beverages. I will recheck this today.  - Hemoglobin A1c  4. Class 1 obesity due  to excess calories with serious comorbidity and body mass index (BMI) of 32.0 to 32.9 in adult Comments:  She is encouraged to strive for BMI less than 30 to decrease cardiac risk. Advised to aim for at least 150 minutes of exercise per week.  5. Drug therapy - Vitamin B12   Patient was given opportunity to ask questions. Patient verbalized understanding of the plan and was able to repeat key elements of the plan. All questions were answered to their satisfaction.   I, Paula Greenland, MD, have reviewed all documentation for this visit. The documentation on 08/12/20 for the exam, diagnosis, procedures, and orders are all accurate and complete.  THE PATIENT IS ENCOURAGED TO PRACTICE SOCIAL DISTANCING DUE TO THE COVID-19 PANDEMIC.

## 2020-08-13 ENCOUNTER — Telehealth: Payer: Medicare Other

## 2020-08-13 LAB — BMP8+EGFR
BUN/Creatinine Ratio: 13 (ref 12–28)
BUN: 12 mg/dL (ref 8–27)
CO2: 23 mmol/L (ref 20–29)
Calcium: 9.1 mg/dL (ref 8.7–10.3)
Chloride: 98 mmol/L (ref 96–106)
Creatinine, Ser: 0.93 mg/dL (ref 0.57–1.00)
Glucose: 94 mg/dL (ref 65–99)
Potassium: 3.7 mmol/L (ref 3.5–5.2)
Sodium: 138 mmol/L (ref 134–144)
eGFR: 59 mL/min/{1.73_m2} — ABNORMAL LOW (ref >59)

## 2020-08-13 LAB — HEMOGLOBIN A1C
Est. average glucose Bld gHb Est-mCnc: 120 mg/dL
Hgb A1c MFr Bld: 5.8 % — ABNORMAL HIGH (ref 4.8–5.6)

## 2020-08-13 LAB — VITAMIN B12: Vitamin B-12: >2000 pg/mL — ABNORMAL HIGH (ref 232–1245)

## 2020-08-28 ENCOUNTER — Other Ambulatory Visit: Payer: Self-pay | Admitting: Allergy & Immunology

## 2020-09-10 ENCOUNTER — Encounter: Payer: Self-pay | Admitting: Allergy & Immunology

## 2020-09-10 ENCOUNTER — Ambulatory Visit (INDEPENDENT_AMBULATORY_CARE_PROVIDER_SITE_OTHER): Payer: Medicare Other | Admitting: Allergy & Immunology

## 2020-09-10 ENCOUNTER — Other Ambulatory Visit: Payer: Self-pay

## 2020-09-10 VITALS — BP 124/68 | HR 81 | Temp 97.3°F | Resp 16

## 2020-09-10 DIAGNOSIS — R42 Dizziness and giddiness: Secondary | ICD-10-CM

## 2020-09-10 DIAGNOSIS — J4520 Mild intermittent asthma, uncomplicated: Secondary | ICD-10-CM | POA: Diagnosis not present

## 2020-09-10 DIAGNOSIS — J3089 Other allergic rhinitis: Secondary | ICD-10-CM | POA: Diagnosis not present

## 2020-09-10 MED ORDER — ADVAIR HFA 115-21 MCG/ACT IN AERO: 2.0000 | INHALATION_SPRAY | Freq: Two times a day (BID) | RESPIRATORY_TRACT | 5 refills | Status: DC

## 2020-09-10 MED ORDER — ALBUTEROL SULFATE HFA 108 (90 BASE) MCG/ACT IN AERS: 2.0000 | INHALATION_SPRAY | Freq: Four times a day (QID) | RESPIRATORY_TRACT | 1 refills | Status: DC | PRN

## 2020-09-10 MED ORDER — OLOPATADINE HCL 0.2 % OP SOLN: 1.0000 [drp] | OPHTHALMIC | 5 refills | Status: DC

## 2020-09-10 NOTE — Patient Instructions (Addendum)
1. Mild intermittent asthma without complication - Lung testing looked stable today. - We are going to send in Advair to help with your coughing (contains a long acting albuterol and inhaled steroid). - Hopefully this is inexpensive enough for you. - Call us if this is not covered well.  - Daily controller medication(s): Advair 115/57mcg one puff twice daily - Prior to physical activity: albuterol 2 puffs 10-15 minutes before physical activity. - Rescue medications: albuterol 4 puffs every 4-6 hours as needed - Changes during respiratory infections or worsening symptoms: Increase Advair 2 puffs twice daily for ONE TO TWO WEEKS. - Asthma control goals:  * Full participation in all desired activities (may need albuterol before activity) * Albuterol use two time or less a week on average (not counting use with activity) * Cough interfering with sleep two time or less a month * Oral steroids no more than once a year * No hospitalizations  2. Allergic rhinitis - Continue with Flonase one spray per nostril daily.  - Continue with azelastine one spray per nostril once daily as well. - Start Pataday one drop per eye twice daily.   3. Vertigo - Continue to follow with Dr. Benjamine Mola as needed.   4. Return in about 6 months (around 03/12/2021).    Please inform us of any Emergency Department visits, hospitalizations, or changes in symptoms. Call us before going to the ED for breathing or allergy symptoms since we might be able to fit you in for a sick visit. Feel free to contact us anytime with any questions, problems, or concerns.  It was a pleasure to see you again today!  Websites that have reliable patient information: 1. American Academy of Asthma, Allergy, and Immunology: www.aaaai.org 2. Food Allergy Research and Education (FARE): foodallergy.org 3. Mothers of Asthmatics: http://www.asthmacommunitynetwork.org 4. American College of Allergy, Asthma, and Immunology: www.acaai.org   COVID-19  Vaccine Information can be found at: [email or web address].[email or web address].uk For questions related to vaccine distribution or appointments, please email [email or web address] or call [phone].   We realize that you might be concerned about having an allergic reaction to the COVID19 vaccines. To help with that concern, WE ARE OFFERING THE COVID19 VACCINES IN OUR OFFICE! Ask the front desk for dates!     "Like" [place] on Facebook and Instagram for our latest updates!      A healthy democracy works best when [employer] participate! Make sure you are registered to vote! If you have moved or changed any of your contact information, you will need to get this updated before voting!  In some cases, you MAY be able to register to vote online: [email or web address]

## 2020-09-10 NOTE — Progress Notes (Signed)
FOLLOW UP  Date of Service/Encounter:  09/10/20   Assessment:   Mild intermittent asthma without complication  Allergic rhinitis  Vertigo  Plan/Recommendations:   1. Mild intermittent asthma without complication - Lung testing looked stable today. - We are going to send in Advair to help with your coughing (contains a long acting albuterol and inhaled steroid). - Hopefully this is inexpensive enough for you. - Call us if this is not covered well.  - Daily controller medication(s): Advair 115/13mcg one puff twice daily - Prior to physical activity: albuterol 2 puffs 10-15 minutes before physical activity. - Rescue medications: albuterol 4 puffs every 4-6 hours as needed - Changes during respiratory infections or worsening symptoms: Increase Advair 2 puffs twice daily for ONE TO TWO WEEKS. - Asthma control goals:  * Full participation in all desired activities (may need albuterol before activity) * Albuterol use two time or less a week on average (not counting use with activity) * Cough interfering with sleep two time or less a month * Oral steroids no more than once a year * No hospitalizations  2. Allergic rhinitis - Continue with Flonase one spray per nostril daily.  - Continue with azelastine one spray per nostril once daily as well. - Start Pataday one drop per eye twice daily.   3. Vertigo - Continue to follow with Dr. Benjamine Mola as needed.   4. Return in about 6 months (around 03/12/2021).    Subjective:   Paula Pollard is a 87 y.o. female presenting today for follow up of  Chief Complaint  Patient presents with  . Itchy Eye  . Nasal Congestion  . Ear Fullness    Paula Pollard has a history of the following: Patient Active Problem List   Diagnosis Date Noted  . Mild intermittent asthma 06/26/2019  . Vertigo 10/28/2018  . Hypokalemia 10/28/2018  . Acute sinusitis 10/05/2017  . Other allergic rhinitis 10/05/2017  . Asthma with acute exacerbation  03/06/2015  . Unexplained night sweats 03/06/2015  . Cough 02/16/2015  . Essential hypertension, benign 08/25/2012  . Other and unspecified hyperlipidemia 08/25/2012  . Benign paroxysmal positional vertigo 08/25/2012  . Unspecified constipation 08/25/2012  . Generalized anxiety disorder 08/25/2012  . GERD (gastroesophageal reflux disease) 08/25/2012  . Osteoarthritis of right hip 08/10/2012    History obtained from: chart review and patient.  Paula Pollard is a 87 y.o. female presenting for a follow up visit.  She was last seen in October 2021.  At that time, we did not do lung testing.  We continue with albuterol as needed and Breo added during flares.  She was using her albuterol quite a bit which I felt was more related to her habit.  For her allergic rhinitis, would continue with Flonase.  We added on Astelin 1 spray per nostril daily.  We started a steroid Dosepak in case she had not improved with the addition of the other no spray alone.  We recommended continued follow-up with Dr. Benjamine Mola for her vertigo.  Since last visit, she has continued to have issues.   Asthma/Respiratory Symptom History: She has been using her Breo every day at this point which seems to help her a lot. She reports wheezing even with the [facility]. She has been having coughing only at night when she is sleeping. It seems to be more of a throat clearing. She has been having some scratchy throat and phlegm production. She reports some chest tightness as well. She does not get short of  breath, only tightness and coughing.   She has seen a Cardiologist every three months. She reports that her heart has been fairly good. She has a diagnosis of left ventricular hypertrophy with diastolic dysfunction. She has had this for years and has never had problems with it.   Allergic Rhinitis Symptom History: She remains on the fluticasone. She is not using the azelastine much at all since it was so expensive. She has not been on antibiotics at  all since the last visit.   She is up to date on her vaccinations. She has received three Moderna vaccines. She is open to receiving another one.   Otherwise, there have been no changes to her past medical history, surgical history, family history, or social history.    Review of Systems  Constitutional: Negative.  Negative for fever, malaise/fatigue and weight loss.  HENT: Negative.  Negative for congestion, ear discharge and ear pain.   Eyes: Negative for pain, discharge and redness.  Respiratory: Positive for cough. Negative for sputum production, shortness of breath and wheezing.   Cardiovascular: Negative.  Negative for chest pain and palpitations.  Gastrointestinal: Negative for abdominal pain, heartburn, nausea and vomiting.  Skin: Negative.  Negative for itching and rash.  Neurological: Negative for dizziness and headaches.  Endo/Heme/Allergies: Negative for environmental allergies. Does not bruise/bleed easily.       Objective:   Blood pressure 124/68, pulse 81, temperature (!) 97.3 F (36.3 C), temperature source Temporal, resp. rate 16, SpO2 98 %. There is no height or weight on file to calculate BMI.   Physical Exam:  Physical Exam Constitutional:      Appearance: She is well-developed.     Comments: Pleasant female.  HENT:     Head: Normocephalic and atraumatic.     Right Ear: Tympanic membrane, ear canal and external ear normal.     Left Ear: Tympanic membrane, ear canal and external ear normal.     Nose: No nasal deformity, septal deviation, mucosal edema or rhinorrhea.     Right Turbinates: Enlarged and swollen.     Left Turbinates: Enlarged and swollen.     Right Sinus: No maxillary sinus tenderness or frontal sinus tenderness.     Left Sinus: No maxillary sinus tenderness or frontal sinus tenderness.     Mouth/Throat:     Mouth: Mucous membranes are not pale and not dry.     Pharynx: Uvula midline.  Eyes:     General:        Right eye: No discharge.         Left eye: No discharge.     Conjunctiva/sclera: Conjunctivae normal.     Right eye: Right conjunctiva is not injected. No chemosis.    Left eye: Left conjunctiva is not injected. No chemosis.    Pupils: Pupils are equal, round, and reactive to light.  Cardiovascular:     Rate and Rhythm: Normal rate and regular rhythm.     Heart sounds: Normal heart sounds.  Pulmonary:     Effort: Pulmonary effort is normal. No tachypnea, accessory muscle usage or respiratory distress.     Breath sounds: Normal breath sounds. No wheezing, rhonchi or rales.     Comments: Moving air well in all lung fields. Chest:     Chest wall: No tenderness.  Lymphadenopathy:     Cervical: No cervical adenopathy.  Skin:    Coloration: Skin is not pale.     Findings: No abrasion, erythema, petechiae or rash. Rash is not  papular, urticarial or vesicular.  Neurological:     Mental Status: She is alert.  Psychiatric:        Behavior: Behavior is cooperative.      Diagnostic studies:    Spirometry: results normal (FEV1: 1.08/74%, FVC: 1.44/73%, FEV1/FVC: 75%).    Spirometry consistent with normal pattern.   Allergy Studies: none           Salvatore Marvel, MD  [facility]

## 2020-09-13 ENCOUNTER — Telehealth: Payer: Self-pay

## 2020-09-13 ENCOUNTER — Telehealth: Payer: Medicare Other

## 2020-09-13 NOTE — Telephone Encounter (Signed)
  Chronic Care Management   Outreach Note  09/13/2020 Name: Paula Pollard MRN: [id number] DOB: [birth year]  Referred by: Glendale Chard, MD Reason for referral : Chronic Care Management (RN CM FU Call Attempt )   An unsuccessful telephone outreach was attempted today. I spoke with Ms. Navarette briefly, unfortunately, she is not available to speak with me today but is agreeable to call back at a later time. The patient was referred to the case management team for assistance with care management and care coordination.   Follow Up Plan: Telephone follow up appointment with care management team member scheduled for: 10/29/20  Barb Merino, RN, BSN, CCM Care Management Coordinator [facility]  Direct Phone: [phone]

## 2020-09-20 ENCOUNTER — Other Ambulatory Visit: Payer: Self-pay | Admitting: Allergy & Immunology

## 2020-09-22 ENCOUNTER — Other Ambulatory Visit: Payer: Self-pay | Admitting: Allergy & Immunology

## 2020-09-23 NOTE — Telephone Encounter (Signed)
Last seen in march of 2022 and has an appointment with gallagher set for October of 2022

## 2020-10-09 ENCOUNTER — Other Ambulatory Visit: Payer: Self-pay | Admitting: Internal Medicine

## 2020-10-29 ENCOUNTER — Telehealth: Payer: Medicare Other

## 2020-10-29 ENCOUNTER — Telehealth: Payer: Self-pay

## 2020-10-29 NOTE — Telephone Encounter (Signed)
  Care Management   Follow Up Note   10/29/2020 Name: Paula Pollard MRN: [id number] DOB: [birth year]   Referred by: Glendale Chard, MD Reason for referral : Chronic Care Management (RNCM Follow up Call - 2nd attempt)   A second unsuccessful telephone outreach was attempted today. The patient was referred to the case management team for assistance with care management and care coordination.   Follow Up Plan: Telephone follow up appointment with care management team member scheduled for: 12/16/20  Barb Merino, RN, BSN, CCM Care Management Coordinator [place] [facility]  Direct Phone: [phone]

## 2020-11-12 ENCOUNTER — Other Ambulatory Visit: Payer: Self-pay | Admitting: Allergy & Immunology

## 2020-11-12 DIAGNOSIS — I10 Essential (primary) hypertension: Secondary | ICD-10-CM | POA: Diagnosis not present

## 2020-11-12 DIAGNOSIS — E785 Hyperlipidemia, unspecified: Secondary | ICD-10-CM | POA: Diagnosis not present

## 2020-11-12 DIAGNOSIS — J45909 Unspecified asthma, uncomplicated: Secondary | ICD-10-CM | POA: Diagnosis not present

## 2020-11-12 DIAGNOSIS — I38 Endocarditis, valve unspecified: Secondary | ICD-10-CM | POA: Diagnosis not present

## 2020-11-12 DIAGNOSIS — I2510 Atherosclerotic heart disease of native coronary artery without angina pectoris: Secondary | ICD-10-CM | POA: Diagnosis not present

## 2020-11-12 DIAGNOSIS — I5030 Unspecified diastolic (congestive) heart failure: Secondary | ICD-10-CM | POA: Diagnosis not present

## 2020-11-14 DIAGNOSIS — E785 Hyperlipidemia, unspecified: Secondary | ICD-10-CM | POA: Diagnosis not present

## 2020-11-14 DIAGNOSIS — I10 Essential (primary) hypertension: Secondary | ICD-10-CM | POA: Diagnosis not present

## 2020-12-16 ENCOUNTER — Telehealth: Payer: Medicare Other

## 2020-12-16 ENCOUNTER — Ambulatory Visit: Payer: Self-pay

## 2020-12-16 DIAGNOSIS — J4520 Mild intermittent asthma, uncomplicated: Secondary | ICD-10-CM

## 2020-12-16 DIAGNOSIS — I10 Essential (primary) hypertension: Secondary | ICD-10-CM

## 2020-12-16 NOTE — Chronic Care Management (AMB) (Signed)
  Care Management   Follow Up Note   12/16/2020 Name: LYNIX BONINE MRN: [id number] DOB: Apr 01, 1930   Referred by: Glendale Chard, MD Reason for referral : Chronic Care Management (RN CM Follow up call - 3rd attempt )   Third unsuccessful telephone outreach was attempted today. The patient was referred to the case management team for assistance with care management and care coordination. The patient's primary care provider has been notified of our unsuccessful attempts to make or maintain contact with the patient. The care management team is pleased to engage with this patient at any time in the future should he/she be interested in assistance from the care management team.   Follow Up Plan: We have been unable to make contact with the patient for follow up. The care management team is available to follow up with the patient after provider conversation with the patient regarding recommendation for care management engagement and subsequent re-referral to the care management team.   Barb Merino, RN, BSN, CCM Care Management Coordinator [place] [facility]  Direct Phone: [phone]

## 2020-12-28 ENCOUNTER — Encounter (HOSPITAL_COMMUNITY): Payer: Self-pay | Admitting: Emergency Medicine

## 2020-12-28 ENCOUNTER — Emergency Department (HOSPITAL_COMMUNITY)
Admission: EM | Admit: 2020-12-28 | Discharge: 2020-12-29 | Disposition: A | Discharge status: Home / Self Care | Payer: Medicare Other | Attending: Emergency Medicine | Admitting: Emergency Medicine

## 2020-12-28 ENCOUNTER — Emergency Department (HOSPITAL_COMMUNITY): Payer: Medicare Other

## 2020-12-28 DIAGNOSIS — I10 Essential (primary) hypertension: Secondary | ICD-10-CM | POA: Insufficient documentation

## 2020-12-28 DIAGNOSIS — W01198A Fall on same level from slipping, tripping and stumbling with subsequent striking against other object, initial encounter: Secondary | ICD-10-CM | POA: Diagnosis not present

## 2020-12-28 DIAGNOSIS — S199XXA Unspecified injury of neck, initial encounter: Secondary | ICD-10-CM | POA: Diagnosis not present

## 2020-12-28 DIAGNOSIS — S0990XA Unspecified injury of head, initial encounter: Secondary | ICD-10-CM

## 2020-12-28 DIAGNOSIS — S0101XA Laceration without foreign body of scalp, initial encounter: Secondary | ICD-10-CM | POA: Insufficient documentation

## 2020-12-28 DIAGNOSIS — R102 Pelvic and perineal pain: Secondary | ICD-10-CM | POA: Diagnosis not present

## 2020-12-28 DIAGNOSIS — Z96641 Presence of right artificial hip joint: Secondary | ICD-10-CM | POA: Diagnosis not present

## 2020-12-28 DIAGNOSIS — Y92000 Kitchen of unspecified non-institutional (private) residence as  the place of occurrence of the external cause: Secondary | ICD-10-CM | POA: Diagnosis not present

## 2020-12-28 DIAGNOSIS — J45909 Unspecified asthma, uncomplicated: Secondary | ICD-10-CM | POA: Insufficient documentation

## 2020-12-28 DIAGNOSIS — E119 Type 2 diabetes mellitus without complications: Secondary | ICD-10-CM | POA: Diagnosis not present

## 2020-12-28 NOTE — ED Provider Notes (Signed)
Emergency Medicine Provider Triage Evaluation Note  Paula Pollard , a 87 y.o. female  was evaluated in triage.  Pt complains of a fall that occurred prior to arrival.  Patient states that she slipped on her kitchen floor and fell backwards.  Denies LOC or anticoagulation.  Notes a laceration to the posterior scalp.  She states that she has a history of right hip replacement and "wants it checked out".  No chest pain or shortness of breath.  Denies any neck or back pain.  Physical Exam  BP (!) 154/81   Pulse 74   Temp 98.5 F (36.9 C) (Oral)   Resp 16   SpO2 98%  Gen:   Awake, no distress   Resp:  Normal effort  MSK:   Moves extremities without difficulty  Other:  Laceration noted to the posterior scalp.  No midline spine pain.  No chest wall or abdominal pain.  Medical Decision Making  Medically screening exam initiated at 5:44 PM.  Appropriate orders placed.  Paula Pollard was informed that the remainder of the evaluation will be completed by another provider, this initial triage assessment does not replace that evaluation, and the importance of remaining in the ED until their evaluation is complete.   Rayna Sexton, PA-C 12/28/20 1745    Luna Fuse, MD 12/29/20 1110

## 2020-12-28 NOTE — ED Triage Notes (Signed)
Pt reports a fall in her kitchen today, due to slippery floor. Denies LOC or blood thinners. Laceration to top of head. Hx of hip replacement and wants it checked out.

## 2020-12-29 ENCOUNTER — Other Ambulatory Visit: Payer: Self-pay

## 2020-12-29 DIAGNOSIS — S0101XA Laceration without foreign body of scalp, initial encounter: Secondary | ICD-10-CM | POA: Diagnosis not present

## 2020-12-29 NOTE — ED Provider Notes (Signed)
Emergency Department Provider Note   I have reviewed the triage vital signs and the nursing notes.   HISTORY  Chief Complaint Fall   HPI Paula Pollard is a 87 y.o. female with PMH reviewed below presents to the ED with fall at home. Patient reports the kitchen floor was slick and she slipped. She struck her head on the floor and developed a laceration with bleeding. No LOC. She had some pain in the hips and has a history of hip replacement so wanted that checked as well. No numbness. No CP or SOB. No pain in the arms/wrists.   Past Medical History:  Diagnosis Date   Anemia    "in past as a young girl"   Anxiety    Arthritis    Asthma    Bronchitis    hx of   Cataract    bilaterally, "no surgery at this time"   Diabetes mellitus without complication ([place])    pre diabetic   Dysrhythmia    when gets excited   GERD (gastroesophageal reflux disease)    Headache(784.0)    hx of migraines   History of hiatal hernia    Hyperlipidemia    Hypertension    sees Dr. Terrence Dupont   Neuromuscular disorder [facility])    hx of carpal tunnel "never had surgery for"right hand    Patient Active Problem List   Diagnosis Date Noted   Mild intermittent asthma 06/26/2019   Vertigo 10/28/2018   Hypokalemia 10/28/2018   Acute sinusitis 10/05/2017   Other allergic rhinitis 10/05/2017   Asthma with acute exacerbation 03/06/2015   Unexplained night sweats 03/06/2015   Cough 02/16/2015   Essential hypertension, benign 08/25/2012   Other and unspecified hyperlipidemia 08/25/2012   Benign paroxysmal positional vertigo 08/25/2012   Unspecified constipation 08/25/2012   Generalized anxiety disorder 08/25/2012   GERD (gastroesophageal reflux disease) 08/25/2012   Osteoarthritis of right hip 08/10/2012    Past Surgical History:  Procedure Laterality Date   CARDIAC CATHETERIZATION     5 years ago no problems   COLONOSCOPY  07/2018   polypectomy x 5   DILATATION & CURETTAGE/HYSTEROSCOPY  WITH MYOSURE N/A 01/06/2018   Procedure: DILATATION & CURETTAGE/HYSTEROSCOPY WITH MYOSURE;  Surgeon: Servando Salina, MD;  Location: [facility];  Service: Gynecology;  Laterality: N/A;   DOPPLER ECHOCARDIOGRAPHY     hx of   NECK SURGERY     2002, cyst removed,    removal of toe nails     both feet   TOTAL HIP ARTHROPLASTY Right 08/08/2012   Dr Mayer Camel   TOTAL HIP ARTHROPLASTY Right 08/08/2012   Procedure: TOTAL HIP ARTHROPLASTY;  Surgeon: Kerin Salen, MD;  Location: [place];  Service: Orthopedics;  Laterality: Right;   TUBAL LIGATION     1970    Allergies Codeine and Doxycycline  Family History  Problem Relation Age of Onset   Heart attack Mother    Heart attack Father    Stroke Father    Stroke Daughter    Colon cancer Sister     Social History Social History   Tobacco Use   Smoking status: Never   Smokeless tobacco: Never  Vaping Use   Vaping Use: Never used  Substance Use Topics   Alcohol use: No   Drug use: No    Review of Systems  Constitutional: No fever/chills Eyes: No visual changes. ENT: No sore throat. Cardiovascular: Denies chest pain. Respiratory: Denies shortness of breath. Gastrointestinal: No abdominal pain.  No nausea,  no vomiting.  No diarrhea.  No constipation. Genitourinary: Negative for dysuria. Musculoskeletal: Negative for back pain. Positive hip pain.  Skin: Scalp laceration and bleeding.  Neurological: Negative for focal weakness or numbness. Positive HA.   10-point ROS otherwise negative.  ____________________________________________   PHYSICAL EXAM:  VITAL SIGNS: ED Triage Vitals [12/28/20 1739]  Enc Vitals Group     BP (!) 154/81     Pulse Rate 74     Resp 16     Temp 98.5 F (36.9 C)     Temp Source Oral     SpO2 98 %   Constitutional: Alert and oriented. Well appearing and in no acute distress. Eyes: Conjunctivae are normal.  Head: 3 cm laceration to the crown to the scalp. Dried blood in the hair.  Nose: No  congestion/rhinnorhea. Mouth/Throat: Mucous membranes are moist.  Neck: No stridor.   Cardiovascular: Normal rate, regular rhythm. Good peripheral circulation. Grossly normal heart sounds.   Respiratory: Normal respiratory effort.  No retractions. Lungs CTAB. Gastrointestinal: Soft and nontender. No distention.  Musculoskeletal: No gross deformities of extremities. Neurologic:  Normal speech and language. No gross focal neurologic deficits are appreciated.  Skin: Scalp laceration. No other abnormalities.   ____________________________________________  RADIOLOGY  DG Pelvis 1-2 Views  Result Date: 12/28/2020 CLINICAL DATA:  Fall onto the right side. Right-sided pain. History of a right hip replacement. EXAM: PELVIS - 1-2 VIEW COMPARISON:  None. FINDINGS: No fracture or bone lesion. Right total hip arthroplasty appears well seated and aligned. No evidence of loosening. Left hip joint, SI joints and symphysis pubis are normally aligned. Soft tissues are unremarkable. IMPRESSION: 1. No fracture, dislocation or evidence of loosening of the right total hip arthroplasty. Electronically Signed   By: Lajean Manes M.D.   On: 12/28/2020 18:12   CT Head Wo Contrast  Result Date: 12/28/2020 CLINICAL DATA:  87 year old female with fall. EXAM: CT HEAD WITHOUT CONTRAST CT CERVICAL SPINE WITHOUT CONTRAST TECHNIQUE: Multidetector CT imaging of the head and cervical spine was performed following the standard protocol without intravenous contrast. Multiplanar CT image reconstructions of the cervical spine were also generated. COMPARISON:  Brain MRI dated 10/29/2018. FINDINGS: CT HEAD FINDINGS Brain: There is mild age-related atrophy and moderate chronic microvascular ischemic changes. There is no acute intracranial hemorrhage. No mass effect or midline shift. No extra-axial fluid collection. Vascular: No hyperdense vessel or unexpected calcification. Skull: Normal. Negative for fracture or focal lesion.  Sinuses/Orbits: There is opacification of several ethmoid air cells. No air-fluid level. The remainder of the visualized paranasal sinuses and mastoid air cells are clear. Other: Laceration of the scalp over the posterior vertex. CT CERVICAL SPINE FINDINGS Alignment: No acute subluxation. There is straightening of normal cervical lordosis which may be positional or due to muscle spasm. Skull base and vertebrae: No acute fracture. Soft tissues and spinal canal: No prevertebral fluid or swelling. No visible canal hematoma. Disc levels: Severe multilevel degenerative changes with disc space narrowing and endplate irregularity and spurring/osteophyte. Upper chest: Negative. Other: None IMPRESSION: 1. No acute intracranial pathology. Age-related atrophy and chronic microvascular ischemic changes. 2. No acute/traumatic cervical spine pathology. Severe multilevel degenerative changes. Electronically Signed   By: Anner Crete M.D.   On: 12/28/2020 21:28   CT Cervical Spine Wo Contrast  Result Date: 12/28/2020 CLINICAL DATA:  87 year old female with fall. EXAM: CT HEAD WITHOUT CONTRAST CT CERVICAL SPINE WITHOUT CONTRAST TECHNIQUE: Multidetector CT imaging of the head and cervical spine was performed following the standard protocol  without intravenous contrast. Multiplanar CT image reconstructions of the cervical spine were also generated. COMPARISON:  Brain MRI dated 10/29/2018. FINDINGS: CT HEAD FINDINGS Brain: There is mild age-related atrophy and moderate chronic microvascular ischemic changes. There is no acute intracranial hemorrhage. No mass effect or midline shift. No extra-axial fluid collection. Vascular: No hyperdense vessel or unexpected calcification. Skull: Normal. Negative for fracture or focal lesion. Sinuses/Orbits: There is opacification of several ethmoid air cells. No air-fluid level. The remainder of the visualized paranasal sinuses and mastoid air cells are clear. Other: Laceration of the scalp  over the posterior vertex. CT CERVICAL SPINE FINDINGS Alignment: No acute subluxation. There is straightening of normal cervical lordosis which may be positional or due to muscle spasm. Skull base and vertebrae: No acute fracture. Soft tissues and spinal canal: No prevertebral fluid or swelling. No visible canal hematoma. Disc levels: Severe multilevel degenerative changes with disc space narrowing and endplate irregularity and spurring/osteophyte. Upper chest: Negative. Other: None IMPRESSION: 1. No acute intracranial pathology. Age-related atrophy and chronic microvascular ischemic changes. 2. No acute/traumatic cervical spine pathology. Severe multilevel degenerative changes. Electronically Signed   By: Anner Crete M.D.   On: 12/28/2020 21:28    ____________________________________________   PROCEDURES  Procedure(s) performed:   Marland KitchenMarland KitchenLaceration Repair  Date/Time: 12/29/2020 6:53 AM Performed by: Margette Fast, MD Authorized by: Margette Fast, MD   Consent:    Consent obtained:  Verbal   Consent given by:  Patient   Risks, benefits, and alternatives were discussed: yes     Risks discussed:  Infection, need for additional repair, nerve damage, poor wound healing, poor cosmetic result, retained foreign body, pain and vascular damage   Alternatives discussed:  No treatment Universal protocol:    Patient identity confirmed:  Verbally with patient Anesthesia:    Anesthesia method:  Local infiltration   Local anesthetic:  Lidocaine 1% WITH epi Laceration details:    Location:  Scalp   Scalp location:  Crown   Length (cm):  3 Pre-procedure details:    Preparation:  Patient was prepped and draped in usual sterile fashion and imaging obtained to evaluate for foreign bodies Exploration:    Hemostasis achieved with:  Direct pressure   Wound extent: no fascia violation noted, no foreign bodies/material noted, no muscle damage noted, no nerve damage noted and no underlying fracture noted      Contaminated: no   Treatment:    Area cleansed with:  Povidone-iodine and saline   Amount of cleaning:  Standard Skin repair:    Repair method:  Staples   Number of staples:  3 Approximation:    Approximation:  Close Repair type:    Repair type:  Simple Post-procedure details:    Dressing:  Bulky dressing   Procedure completion:  Tolerated well, no immediate complications   ____________________________________________   INITIAL IMPRESSION / ASSESSMENT AND PLAN / ED COURSE  Pertinent labs & imaging results that were available during my care of the patient were reviewed by me and considered in my medical decision making (see chart for details).   Patient presents with mechanical fall ans scalp laceration. CT head reviewed w/o acute findings. Laceration cleaned and repaired. Plan for discharge. Staples to be removed in 1 week.   ____________________________________________  FINAL CLINICAL IMPRESSION(S) / ED DIAGNOSES  Final diagnoses:  Injury of head, initial encounter  Laceration of scalp, initial encounter    Note:  This document was prepared using Dragon voice recognition software and may include unintentional dictation  errors.  Nanda Quinton, MD, [facility] Emergency Medicine    Joanny Dupree, [facility], MD 12/31/20 [phone]

## 2020-12-29 NOTE — Discharge Instructions (Addendum)
Return in 1 week for staple removal. If you develop fever, redness, drainage, or swelling near the wound you should return sooner.

## 2020-12-29 NOTE — ED Notes (Signed)
Provider at bedside

## 2020-12-30 ENCOUNTER — Telehealth: Payer: Self-pay

## 2020-12-30 NOTE — Telephone Encounter (Signed)
Transition Care Management Follow-up Telephone Call Date of discharge and from where: 12/29/2020 [facility]  How have you been since you were released from the hospital? Pt said she feels a lot better.  Any questions or concerns? No  Items Reviewed: Did the pt receive and understand the discharge instructions provided? Yes  Medications obtained and verified? Yes  Other? No  Any new allergies since your discharge? No  Dietary orders reviewed? Yes Do you have support at home? Yes   Home Care and Equipment/Supplies: Were home health services ordered? not applicable If so, what is the name of the agency? N/a   Has the agency set up a time to come to the patient's home? not applicable Were any new equipment or medical supplies ordered?  No What is the name of the medical supply agency? N/a  Were you able to get the supplies/equipment? not applicable Do you have any questions related to the use of the equipment or supplies? No  Functional Questionnaire: (I = Independent and D = Dependent) ADLs: I  Bathing/Dressing- I  Meal Prep- I  Eating- I  Maintaining continence- I  Transferring/Ambulation- I  Managing Meds- I  Follow up appointments reviewed:  PCP Hospital f/u appt confirmed? Yes  Scheduled to see Debbe Mounts on 01/08/2021 @ 9:30am. [facility] f/u appt confirmed? No  Scheduled to see N/A on N/A @ N/A. Are transportation arrangements needed? No  If their condition worsens, is the pt aware to call PCP or go to the Emergency Dept.? Yes Was the patient provided with contact information for the PCP's office or ED? Yes Was to pt encouraged to call back with questions or concerns? Yes

## 2021-01-08 ENCOUNTER — Ambulatory Visit (INDEPENDENT_AMBULATORY_CARE_PROVIDER_SITE_OTHER): Payer: Medicare Other | Admitting: Nurse Practitioner

## 2021-01-08 ENCOUNTER — Encounter: Payer: Self-pay | Admitting: Nurse Practitioner

## 2021-01-08 ENCOUNTER — Other Ambulatory Visit: Payer: Self-pay

## 2021-01-08 VITALS — BP 120/84 | HR 72 | Temp 98.6°F | Ht 61.0 in | Wt 172.6 lb

## 2021-01-08 DIAGNOSIS — Z9889 Other specified postprocedural states: Secondary | ICD-10-CM

## 2021-01-08 DIAGNOSIS — Z09 Encounter for follow-up examination after completed treatment for conditions other than malignant neoplasm: Secondary | ICD-10-CM

## 2021-01-08 DIAGNOSIS — Z4802 Encounter for removal of sutures: Secondary | ICD-10-CM | POA: Diagnosis not present

## 2021-01-08 NOTE — Progress Notes (Addendum)
I,[name],acting as a Education administrator for [employer], NP.,have documented all relevant documentation on the behalf of [employer], NP,as directed by  Bary Castilla, NP while in the presence of Bary Castilla, NP.  This visit occurred during the SARS-CoV-2 public health emergency.  Safety protocols were in place, including screening questions prior to the visit, additional usage of staff PPE, and extensive cleaning of exam room while observing appropriate contact time as indicated for disinfecting solutions.  Subjective:     Patient ID: Paula Pollard , female    DOB: [birth year] , 87 y.o.   MRN: [id number]   Chief Complaint  Patient presents with   hospital f/u    HPI  The patient is here today for a hospital follow-up. The patient states she need staples removed from her head. The patient reports she was in the kitchen preparing a meal for her family when she slipped and fell and hit her head on the floor. She went the ED after that. The ED did a CT of her head which was unremarkable. She also reports she had recent hip surgery and was worried about her hip. The pelvic exam showed no fracture. The patient got 3 staples placed on her head which required 3 staples.      Past Medical History:  Diagnosis Date   Anemia    "in past as a young girl"   Anxiety    Arthritis    Asthma    Bronchitis    hx of   Cataract    bilaterally, "no surgery at this time"   Diabetes mellitus without complication ([place])    pre diabetic   Dysrhythmia    when gets excited   GERD (gastroesophageal reflux disease)    Headache(784.0)    hx of migraines   History of hiatal hernia    Hyperlipidemia    Hypertension    sees Dr. Terrence Dupont   Neuromuscular disorder ([place])    hx of carpal tunnel "never had surgery for"right hand     Family History  Problem Relation Age of Onset   Heart attack Mother    Heart attack Father    Stroke Father    Stroke Daughter    Colon cancer Sister       Current Outpatient Medications:    albuterol (VENTOLIN HFA) 108 (90 Base) MCG/ACT inhaler, TAKE 2 PUFFS BY MOUTH EVERY 6 HOURS AS NEEDED FOR WHEEZE OR SHORTNESS OF BREATH, Disp: 18 each, Rfl: 1   Ascorbic Acid (VITAMIN C PO), Take 1 tablet by mouth daily., Disp: , Rfl:    aspirin EC 81 MG tablet, Take 81 mg by mouth daily., Disp: , Rfl:    atorvastatin (LIPITOR) 40 MG tablet, Take 40 mg by mouth daily., Disp: , Rfl:    Azelastine-Fluticasone (DYMISTA) 137-50 MCG/ACT SUSP, Place 1 spray into both nostrils 2 (two) times daily as needed., Disp: 23 g, Rfl: 5   Calcium 200 MG TABS, Take 200 mg by mouth daily., Disp: , Rfl:    cholecalciferol (VITAMIN D) 400 units TABS tablet, Take 1,200 Units by mouth daily., Disp: , Rfl:    Cyanocobalamin (B-12 PO), Take 1 tablet by mouth as needed (for energy)., Disp: , Rfl:    esomeprazole (NEXIUM) 20 MG capsule, Take 20 mg by mouth as needed (for heartburn)., Disp: , Rfl:    fluticasone (FLONASE) 50 MCG/ACT nasal spray, PLACE 2 SPRAYS INTO BOTH NOSTRILS DAILY AS NEEDED FOR ALLERGIES OR RHINITIS., Disp: 16 mL, Rfl: 11  fluticasone-salmeterol (ADVAIR HFA) 115-21 MCG/ACT inhaler, Inhale 2 puffs into the lungs 2 (two) times daily., Disp: 1 each, Rfl: 5   hydrochlorothiazide (HYDRODIURIL) 12.5 MG tablet, TAKE 1 TABLET BY MOUTH EVERY DAY, Disp: 90 tablet, Rfl: 1   LORazepam (ATIVAN) 0.5 MG tablet, Take 1 tablet (0.5 mg total) by mouth every 12 (twelve) hours as needed for anxiety., Disp: 30 tablet, Rfl: 0   meclizine (ANTIVERT) 25 MG tablet, , Disp: , Rfl:    Multiple Vitamins-Minerals (ECHINACEA ACZ PO), Take 1 capsule by mouth as needed (for immune support)., Disp: , Rfl:    nebivolol (BYSTOLIC) 5 MG tablet, Take 1 tablet (5 mg total) by mouth daily., Disp: 30 tablet, Rfl: 0   Olopatadine HCl (PATADAY) 0.2 % SOLN, Place 1 drop into both eyes 1 day or 1 dose., Disp: 2.5 mL, Rfl: 5   telmisartan (MICARDIS) 20 MG tablet, TAKE 1 TABLET BY MOUTH EVERY DAY, Disp: 90  tablet, Rfl: 1   triamcinolone cream (KENALOG) 0.1 %, Apply 1 application topically 2 (two) times daily as needed., Disp: 30 g, Rfl: 1   vitamin E 400 UNIT capsule, Take 400 Units by mouth daily., Disp: , Rfl:    Polyethyl Glycol-Propyl Glycol 0.4-0.3 % SOLN, Apply 1 drop to eye 2 (two) times a day. systane (Patient not taking: Reported on 01/08/2021), Disp: , Rfl:    Allergies  Allergen Reactions   Codeine Nausea Only and Other (See Comments)    Reaction:Dizziness and hallucinations "makes me climb walls"   Doxycycline Other (See Comments)     Review of Systems  Constitutional: Negative.  Negative for chills and fatigue.  HENT:  Negative for congestion, sinus pressure and sinus pain.   Respiratory: Negative.  Negative for cough and wheezing.   Cardiovascular: Negative.  Negative for chest pain and palpitations.  Gastrointestinal: Negative.   Neurological:  Negative for dizziness, weakness, numbness and headaches.  Psychiatric/Behavioral: Negative.    All other systems reviewed and are negative.   Today's Vitals   01/08/21 0947  BP: 120/84  Pulse: 72  Temp: 98.6 F (37 C)  TempSrc: Oral  Weight: 172 lb 9.6 oz (78.3 kg)  Height: '5\' 1"'$  (1.549 m)  PainSc: 0-No pain   Body mass index is 32.61 kg/m.  Wt Readings from Last 3 Encounters:  01/08/21 172 lb 9.6 oz (78.3 kg)  12/29/20 171 lb (77.6 kg)  08/12/20 176 lb 6.4 oz (80 kg)    BP Readings from Last 3 Encounters:  01/08/21 120/84  12/29/20 (!) 147/70  09/10/20 124/68    Objective:  Physical Exam Constitutional:      Appearance: Normal appearance.  HENT:     Head: Normocephalic. Laceration present.     Comments: Laceration to the posterior scalp. 3 staples visualized and removed.  Cardiovascular:     Rate and Rhythm: Normal rate and regular rhythm.     Pulses: Normal pulses.     Heart sounds: Normal heart sounds. No murmur heard. Pulmonary:     Effort: Pulmonary effort is normal. No respiratory distress.     Breath  sounds: Normal breath sounds. No stridor.  Skin:    Capillary Refill: Capillary refill takes less than 2 seconds.  Neurological:     Mental Status: She is alert and oriented to person, place, and time.     Cranial Nerves: No cranial nerve deficit.  Psychiatric:        Mood and Affect: Mood normal.        Behavior:  Behavior normal.        Assessment And Plan:     1. ED discharge follow-up Pt. Is here for a hospital follow up after she fell at home. She fell on her kitchen floor. She hit her head on the flor and developed laceration with bleeding.  No LOC. No numbness or Chest pain or SOB. Her CT and pelvic/spine did not show any fractures or abnormalities.   2. H/O laceration repair She had a laceration to the posterior scalp. 3 staples were placed in the ED.   3. Removal of staples  -The patient is here to get her staples removed. Staples removed with no complication or bleeding. Pt. Verbalized understanding that if she notes any further bleeding, she will contact us or go to the emergency room. Patient instructed to keep the area clean for 2 days and to clean it with a damp wash cloth.   Patient was given opportunity to ask questions. Patient verbalized understanding of the plan and was able to repeat key elements of the plan. All questions were answered to their satisfaction.  Bary Castilla, NP   I, Bary Castilla, NP, have reviewed all documentation for this visit. The documentation on 01/08/21 for the exam, diagnosis, procedures, and orders are all accurate and complete.   IF YOU HAVE BEEN REFERRED TO A SPECIALIST, IT MAY TAKE 1-2 WEEKS TO SCHEDULE/PROCESS THE REFERRAL. IF YOU HAVE NOT HEARD FROM US/SPECIALIST IN TWO WEEKS, PLEASE GIVE [place] A CALL AT [phone].   THE PATIENT IS ENCOURAGED TO PRACTICE SOCIAL DISTANCING DUE TO THE COVID-19 PANDEMIC.

## 2021-01-15 NOTE — Addendum Note (Signed)
Addended by: Melba Coon on: 01/15/2021 05:26 PM   Modules accepted: Level of Service

## 2021-02-11 DIAGNOSIS — I10 Essential (primary) hypertension: Secondary | ICD-10-CM | POA: Diagnosis not present

## 2021-02-11 DIAGNOSIS — I2510 Atherosclerotic heart disease of native coronary artery without angina pectoris: Secondary | ICD-10-CM | POA: Diagnosis not present

## 2021-02-11 DIAGNOSIS — I38 Endocarditis, valve unspecified: Secondary | ICD-10-CM | POA: Diagnosis not present

## 2021-02-11 DIAGNOSIS — I5030 Unspecified diastolic (congestive) heart failure: Secondary | ICD-10-CM | POA: Diagnosis not present

## 2021-02-11 DIAGNOSIS — E559 Vitamin D deficiency, unspecified: Secondary | ICD-10-CM | POA: Diagnosis not present

## 2021-02-11 DIAGNOSIS — J45909 Unspecified asthma, uncomplicated: Secondary | ICD-10-CM | POA: Diagnosis not present

## 2021-02-11 DIAGNOSIS — E785 Hyperlipidemia, unspecified: Secondary | ICD-10-CM | POA: Diagnosis not present

## 2021-03-03 ENCOUNTER — Other Ambulatory Visit: Payer: Self-pay | Admitting: Allergy & Immunology

## 2021-03-08 ENCOUNTER — Other Ambulatory Visit: Payer: Self-pay | Admitting: Internal Medicine

## 2021-03-11 ENCOUNTER — Encounter: Payer: Self-pay | Admitting: Allergy & Immunology

## 2021-03-11 ENCOUNTER — Ambulatory Visit (INDEPENDENT_AMBULATORY_CARE_PROVIDER_SITE_OTHER): Payer: Medicare Other | Admitting: Allergy & Immunology

## 2021-03-11 ENCOUNTER — Other Ambulatory Visit: Payer: Self-pay

## 2021-03-11 VITALS — BP 142/80 | HR 77 | Temp 97.7°F | Resp 16 | Ht 61.0 in | Wt 172.8 lb

## 2021-03-11 DIAGNOSIS — J3089 Other allergic rhinitis: Secondary | ICD-10-CM | POA: Diagnosis not present

## 2021-03-11 DIAGNOSIS — R42 Dizziness and giddiness: Secondary | ICD-10-CM

## 2021-03-11 DIAGNOSIS — J4540 Moderate persistent asthma, uncomplicated: Secondary | ICD-10-CM | POA: Diagnosis not present

## 2021-03-11 MED ORDER — ALBUTEROL SULFATE HFA 108 (90 BASE) MCG/ACT IN AERS: 2.0000 | INHALATION_SPRAY | RESPIRATORY_TRACT | 1 refills | Status: DC | PRN

## 2021-03-11 MED ORDER — ADVAIR HFA 115-21 MCG/ACT IN AERO: 2.0000 | INHALATION_SPRAY | Freq: Two times a day (BID) | RESPIRATORY_TRACT | 5 refills | Status: DC

## 2021-03-11 NOTE — Progress Notes (Signed)
FOLLOW UP  Date of Service/Encounter:  03/11/21   Assessment:   Mild intermittent asthma without complication   Allergic rhinitis   Vertigo  Plan/Recommendations:   1. Mild intermittent asthma without complication - Lung testing looked stable today. - We are not going to make any changes, but if you are doing well on two puffs ONCE daily, that is fine to continue with that.  - Daily controller medication(s): Advair 115/13mcg two puffs 1-2 times daily - Prior to physical activity: albuterol 2 puffs 10-15 minutes before physical activity. - Rescue medications: albuterol 4 puffs every 4-6 hours as needed - Changes during respiratory infections or worsening symptoms: Increase Advair 2 puffs twice daily for ONE TO TWO WEEKS. - Asthma control goals:  * Full participation in all desired activities (may need albuterol before activity) * Albuterol use two time or less a week on average (not counting use with activity) * Cough interfering with sleep two time or less a month * Oral steroids no more than once a year * No hospitalizations  2. Allergic rhinitis - Continue with Flonase one spray per nostril daily.  - Continue with azelastine one spray per nostril once daily as well. - Continue with Pataday one drop per eye twice daily.   3. Vertigo - Continue to follow with Dr. Benjamine Mola as needed.   4. Return in about 6 months (around 09/09/2021).    Subjective:   Paula Pollard is a 87 y.o. female presenting today for follow up of  Chief Complaint  Patient presents with   Asthma    Uses rescue inhaler as needed for wheezing - no other flares or symptoms    Allergic Rhinitis     When she uses the nasal sprays she has flem in her throat afterwards - white mucus     Paula Pollard has a history of the following: Patient Active Problem List   Diagnosis Date Noted   Mild intermittent asthma 06/26/2019   Vertigo 10/28/2018   Hypokalemia 10/28/2018   Acute sinusitis 10/05/2017    Other allergic rhinitis 10/05/2017   Asthma with acute exacerbation 03/06/2015   Unexplained night sweats 03/06/2015   Cough 02/16/2015   Essential hypertension, benign 08/25/2012   Other and unspecified hyperlipidemia 08/25/2012   Benign paroxysmal positional vertigo 08/25/2012   Unspecified constipation 08/25/2012   Generalized anxiety disorder 08/25/2012   GERD (gastroesophageal reflux disease) 08/25/2012   Osteoarthritis of right hip 08/10/2012    History obtained from: chart review and patient.  Paula Pollard is a 87 y.o. female presenting for a follow up visit.  She was last seen in April 2022.  At that time, lung testing looks stable.  We sent in Advair to help with coughing.  For her rhinitis, we continued with Flonase and Astelin.  Since the last visit, she has mostly done well.   Asthma/Respiratory Symptom History: She is on the  Advair two puffs twice daily. This is an important one for her. She uses it every morning, but it can depend on how she feels. She is coughing very seldomly with the somewhat regular use of the Advair. She has been on the diskus, but it does not seem to work as well as the [facility]  Allergic Rhinitis Symptom History: She is on fluticasone and azelastine.  She has been having more mucous production starting yesterday. She using the Netti pot and this helps. She reports a sore throat. She has overall been fairly healthy, however. She has not needed antibiotics  in a while.   She had a fall recently and was worried about her hip. She ended up getting a gash in her head. She did go to the ED and everything looked good.   Otherwise, there have been no changes to her past medical history, surgical history, family history, or social history.    Review of Systems  Constitutional: Negative.  Negative for chills, fever, malaise/fatigue and weight loss.  HENT:  Positive for congestion. Negative for ear discharge, ear pain and sinus pain.   Eyes:  Negative for pain,  discharge and redness.  Respiratory:  Negative for cough, sputum production, shortness of breath and wheezing.   Cardiovascular: Negative.  Negative for chest pain and palpitations.  Gastrointestinal:  Negative for abdominal pain, constipation, diarrhea, heartburn, nausea and vomiting.  Skin: Negative.  Negative for itching and rash.  Neurological:  Negative for dizziness and headaches.  Endo/Heme/Allergies:  Positive for environmental allergies. Does not bruise/bleed easily.      Objective:   Blood pressure (!) 142/80, pulse 77, temperature 97.7 F (36.5 C), resp. rate 16, height 5\' 1"  (1.549 m), weight 172 lb 12.8 oz (78.4 kg), SpO2 95 %. Body mass index is 32.65 kg/m.   Physical Exam:  Physical Exam Vitals reviewed.  Constitutional:      Appearance: She is well-developed.     Comments: Very pleasant and talkative.  HENT:     Head: Normocephalic and atraumatic.     Right Ear: Tympanic membrane, ear canal and external ear normal.     Left Ear: Tympanic membrane, ear canal and external ear normal.     Nose: Rhinorrhea present. No nasal deformity, septal deviation or mucosal edema.     Right Turbinates: Enlarged. Not swollen.     Left Turbinates: Enlarged. Not swollen.     Right Sinus: No maxillary sinus tenderness or frontal sinus tenderness.     Left Sinus: No maxillary sinus tenderness or frontal sinus tenderness.     Mouth/Throat:     Mouth: Mucous membranes are not pale and not dry.     Pharynx: Uvula midline.  Eyes:     General: Lids are normal. No allergic shiner.       Right eye: No discharge.        Left eye: No discharge.     Conjunctiva/sclera: Conjunctivae normal.     Right eye: Right conjunctiva is not injected. No chemosis.    Left eye: Left conjunctiva is not injected. No chemosis.    Pupils: Pupils are equal, round, and reactive to light.  Cardiovascular:     Rate and Rhythm: Normal rate and regular rhythm.     Heart sounds: Normal heart sounds.   Pulmonary:     Effort: Pulmonary effort is normal. No tachypnea, accessory muscle usage or respiratory distress.     Breath sounds: Normal breath sounds. No wheezing, rhonchi or rales.     Comments: Clear rhinorrhea bilaterally.  Chest:     Chest wall: No tenderness.  Lymphadenopathy:     Cervical: No cervical adenopathy.  Skin:    Coloration: Skin is not pale.     Findings: No abrasion, erythema, petechiae or rash. Rash is not papular, urticarial or vesicular.  Neurological:     Mental Status: She is alert.  Psychiatric:        Behavior: Behavior is cooperative.     Diagnostic studies:    Spirometry: results normal (FEV1: 1.01/75%, FVC: 1.27/69%, FEV1/FVC: 80%).    Spirometry consistent with normal  pattern.   Allergy Studies: none        Salvatore Marvel, MD  [facility] [place]

## 2021-03-11 NOTE — Patient Instructions (Addendum)
1. Mild intermittent asthma without complication - Lung testing looked stable today. - We are not going to make any changes, but if you are doing well on two puffs ONCE daily, that is fine to continue with that.  - Daily controller medication(s): Advair 115/38mcg two puffs 1-2 times daily - Prior to physical activity: albuterol 2 puffs 10-15 minutes before physical activity. - Rescue medications: albuterol 4 puffs every 4-6 hours as needed - Changes during respiratory infections or worsening symptoms: Increase Advair 2 puffs twice daily for ONE TO TWO WEEKS. - Asthma control goals:  * Full participation in all desired activities (may need albuterol before activity) * Albuterol use two time or less a week on average (not counting use with activity) * Cough interfering with sleep two time or less a month * Oral steroids no more than once a year * No hospitalizations  2. Allergic rhinitis - Continue with Flonase one spray per nostril daily.  - Continue with azelastine one spray per nostril once daily as well. - Continue with Pataday one drop per eye twice daily.   3. Vertigo - Continue to follow with Dr. Benjamine Mola as needed.   4. Return in about 6 months (around 09/09/2021).    Please inform us of any Emergency Department visits, hospitalizations, or changes in symptoms. Call us before going to the ED for breathing or allergy symptoms since we might be able to fit you in for a sick visit. Feel free to contact us anytime with any questions, problems, or concerns.  It was a pleasure to see you again today!  Websites that have reliable patient information: 1. American Academy of Asthma, Allergy, and Immunology: www.aaaai.org 2. Food Allergy Research and Education (FARE): foodallergy.org 3. Mothers of Asthmatics: http://www.asthmacommunitynetwork.org 4. American College of Allergy, Asthma, and Immunology: www.acaai.org   COVID-19 Vaccine Information can be found at:  [email or web address].[email or web address].uk For questions related to vaccine distribution or appointments, please email [email or web address] or call [phone].   We realize that you might be concerned about having an allergic reaction to the COVID19 vaccines. To help with that concern, WE ARE OFFERING THE COVID19 VACCINES IN OUR OFFICE! Ask the front desk for dates!     "Like" [place] on Facebook and Instagram for our latest updates!      A healthy democracy works best when [employer] participate! Make sure you are registered to vote! If you have moved or changed any of your contact information, you will need to get this updated before voting!  In some cases, you MAY be able to register to vote online: [email or web address]

## 2021-03-13 MED ORDER — FLUTICASONE PROPIONATE 50 MCG/ACT NA SUSP: 1.0000 | Freq: Every day | NASAL | 5 refills | Status: DC

## 2021-03-13 MED ORDER — AZELASTINE HCL 0.1 % NA SOLN: 1.0000 | Freq: Every day | NASAL | 5 refills | Status: DC

## 2021-03-18 DIAGNOSIS — H9042 Sensorineural hearing loss, unilateral, left ear, with unrestricted hearing on the contralateral side: Secondary | ICD-10-CM | POA: Diagnosis not present

## 2021-03-18 DIAGNOSIS — R42 Dizziness and giddiness: Secondary | ICD-10-CM | POA: Diagnosis not present

## 2021-03-18 DIAGNOSIS — H838X2 Other specified diseases of left inner ear: Secondary | ICD-10-CM | POA: Diagnosis not present

## 2021-03-19 ENCOUNTER — Telehealth: Payer: Self-pay | Admitting: Allergy & Immunology

## 2021-03-19 NOTE — Telephone Encounter (Signed)
Patient was seen last week by Dr. Ernst Bowler and feels she is still having her respiratory infection. I did ask pt if she was using her advair as advised for her symptoms. Patient stated she had not gotten her advair from the pharmacy as it is too expensive. Patient then stated she would like Dr. Ernst Bowler to send in some prednisone to clear her infection up as he did the last time and that seemed to work for her. Pt did reach out to Dr. Benjamine Mola and was told she needed to reach out to our office.   [facility] [address] [place] [address]  Best contact number for patient, [phone]

## 2021-03-19 NOTE — Telephone Encounter (Signed)
Please advise to prednisone rx and about referral to Dr Benjamine Mola? What other inhaler could we try and see if it is covered?

## 2021-03-21 MED ORDER — AZITHROMYCIN 250 MG PO TABS: ORAL_TABLET | ORAL | 0 refills | Status: DC

## 2021-03-21 MED ORDER — PREDNISONE 10 MG PO TABS: ORAL_TABLET | ORAL | 0 refills | Status: DC

## 2021-03-21 NOTE — Telephone Encounter (Signed)
Sent in azithromycin and prednisone. She already follows with Dr. Benjamine Mola, so no referral needed. She sees him for vertigo.   Not sure when she is following up, but let's move it up to follow up in one month.   Salvatore Marvel, MD [facility] [place]

## 2021-03-24 NOTE — Telephone Encounter (Signed)
Called patient to see if the patient was able to pick up the azithromycin and prednisone Dr. Ernst Bowler sent in. I left a message for the patient to call back to go over previous note.

## 2021-03-24 NOTE — Telephone Encounter (Signed)
Patient saw Dr. Benjamine Mola last week Wednesday or Thursday and was told to see a pulmonologist. Patient thought Dr. Ernst Bowler was a pulmonologist. I told the patient to call back Dr. Benjamine Mola to check on the referral for a pulmonologist. Patient stated she has no interest in seeing a pulmonologist at this time. Patient also stated she has not picked up the azithromycin or prednisone yet. She plans to get transportation tomorrow or sometime this week so she can go get her medications. She complains of coughing and just overall not feeling well.

## 2021-03-25 NOTE — Telephone Encounter (Signed)
Goodness - I hope she gets her medication soon. Can we make sure that she is being seen within the next month with [place]?  Salvatore Marvel, MD [facility] [place]

## 2021-03-25 NOTE — Telephone Encounter (Signed)
Great - that should work just fine.   Salvatore Marvel, MD [facility] [place]

## 2021-03-25 NOTE — Telephone Encounter (Signed)
Pt picked up her medications today and has started them. She is down for a return visit nov 3rd at 40

## 2021-04-04 ENCOUNTER — Other Ambulatory Visit: Payer: Self-pay | Admitting: Internal Medicine

## 2021-04-09 DIAGNOSIS — Z1231 Encounter for screening mammogram for malignant neoplasm of breast: Secondary | ICD-10-CM | POA: Diagnosis not present

## 2021-04-09 LAB — HM MAMMOGRAPHY

## 2021-04-10 ENCOUNTER — Ambulatory Visit (INDEPENDENT_AMBULATORY_CARE_PROVIDER_SITE_OTHER): Payer: Medicare Other | Admitting: Allergy & Immunology

## 2021-04-10 ENCOUNTER — Other Ambulatory Visit: Payer: Self-pay

## 2021-04-10 VITALS — BP 130/80 | HR 74 | Temp 98.2°F | Resp 18

## 2021-04-10 DIAGNOSIS — J4540 Moderate persistent asthma, uncomplicated: Secondary | ICD-10-CM

## 2021-04-10 DIAGNOSIS — R42 Dizziness and giddiness: Secondary | ICD-10-CM | POA: Diagnosis not present

## 2021-04-10 DIAGNOSIS — J3089 Other allergic rhinitis: Secondary | ICD-10-CM | POA: Diagnosis not present

## 2021-04-10 NOTE — Patient Instructions (Addendum)
1. Mild intermittent asthma without complication - Lung testing looked stable today. - We are not going to make any changes. - Daily controller medication(s): Advair 115/60mcg two puffs 1-2 times daily - Prior to physical activity: albuterol 2 puffs 10-15 minutes before physical activity. - Rescue medications: albuterol 4 puffs every 4-6 hours as needed - Changes during respiratory infections or worsening symptoms: Increase Advair 2 puffs twice daily for ONE TO TWO WEEKS. - Asthma control goals:  * Full participation in all desired activities (may need albuterol before activity) * Albuterol use two time or less a week on average (not counting use with activity) * Cough interfering with sleep two time or less a month * Oral steroids no more than once a year * No hospitalizations  2. Allergic rhinitis - Continue with Flonase one spray per nostril daily.  - Continue with azelastine one spray per nostril once daily as well. - Continue with Pataday one drop per eye twice daily.   3. Vertigo - Continue to follow with Dr. Benjamine Mola as needed.   4. Return in about 6 months (around 10/08/2021).    Please inform us of any Emergency Department visits, hospitalizations, or changes in symptoms. Call us before going to the ED for breathing or allergy symptoms since we might be able to fit you in for a sick visit. Feel free to contact us anytime with any questions, problems, or concerns.  It was a pleasure to see you again today!  Websites that have reliable patient information: 1. American Academy of Asthma, Allergy, and Immunology: www.aaaai.org 2. Food Allergy Research and Education (FARE): foodallergy.org 3. Mothers of Asthmatics: http://www.asthmacommunitynetwork.org 4. American College of Allergy, Asthma, and Immunology: www.acaai.org    COVID-19 Vaccine Information can be found at: ShippingScam.co.uk For questions related to vaccine  distribution or appointments, please email [email or web address] or call [phone].   We realize that you might be concerned about having an allergic reaction to the COVID19 vaccines. To help with that concern, WE ARE OFFERING THE COVID19 VACCINES IN OUR OFFICE! Ask the front desk for dates!     "Like" [place] on Facebook and Instagram for our latest updates!      A healthy democracy works best when [employer] participate! Make sure you are registered to vote! If you have moved or changed any of your contact information, you will need to get this updated before voting!  In some cases, you MAY be able to register to vote online: [email or web address]    EARLY VOTING HAS STARTED! If you still need to register to vote, you can do this and cast a ballot at any of the early voting locations!

## 2021-04-10 NOTE — Progress Notes (Signed)
FOLLOW UP  Date of Service/Encounter:  04/10/21   Assessment:   Mild intermittent asthma without complication   Allergic rhinitis   Vertigo  Plan/Recommendations:    1. Mild intermittent asthma without complication - Lung testing looked stable today. - We are not going to make any changes. - Daily controller medication(s): Advair 115/63mcg two puffs 1-2 times daily - Prior to physical activity: albuterol 2 puffs 10-15 minutes before physical activity. - Rescue medications: albuterol 4 puffs every 4-6 hours as needed - Changes during respiratory infections or worsening symptoms: Increase Advair 2 puffs twice daily for ONE TO TWO WEEKS. - Asthma control goals:  * Full participation in all desired activities (may need albuterol before activity) * Albuterol use two time or less a week on average (not counting use with activity) * Cough interfering with sleep two time or less a month * Oral steroids no more than once a year * No hospitalizations  2. Allergic rhinitis - Continue with Flonase one spray per nostril daily.  - Continue with azelastine one spray per nostril once daily as well. - Continue with Pataday one drop per eye twice daily.   3. Vertigo - Continue to follow with Dr. Benjamine Mola as needed.   4. Return in about 6 months (around 10/08/2021).   Subjective:   Paula Pollard is a 87 y.o. female presenting today for follow up of  Chief Complaint  Patient presents with   Cough    Feels better from the last visit, medication helped     Paula Pollard has a history of the following: Patient Active Problem List   Diagnosis Date Noted   Mild intermittent asthma 06/26/2019   Vertigo 10/28/2018   Hypokalemia 10/28/2018   Acute sinusitis 10/05/2017   Other allergic rhinitis 10/05/2017   Asthma with acute exacerbation 03/06/2015   Unexplained night sweats 03/06/2015   Cough 02/16/2015   Essential hypertension, benign 08/25/2012   Other and unspecified  hyperlipidemia 08/25/2012   Benign paroxysmal positional vertigo 08/25/2012   Unspecified constipation 08/25/2012   Generalized anxiety disorder 08/25/2012   GERD (gastroesophageal reflux disease) 08/25/2012   Osteoarthritis of right hip 08/10/2012    History obtained from: chart review and patient.  Paula Pollard is a 87 y.o. female presenting for a follow up visit.  She was last seen in October 2022.  At that time, her lung testing looks stable.  We continued with Advair 2 puffs 1-2 times daily.  For her allergic rhinitis, we continue with Flonase and Astelin as well as Pataday.  Since last visit, she has done very well. She feels like a new woman today.   Asthma/Respiratory Symptom History: She remains on the Advair 2 puffs twice daily.  She uses her rescue inhaler very rarely.  She has not been to the emergency room nor has she needed any prednisone in quite some time.  She feels that she is able to do the stuff she needs, although she does get tired.  She does realize this might be side effect of her age.  However, she remains quite vivacious.  Allergic Rhinitis Symptom History: She has not had any sinus infections.  She continues with her Flonase and her Astelin.  She also has eyedrops to use as needed.  Overall, this is a fairly good time of the year for her.  Her vertigo is under fair control.  She saw Dr. Benjamine Mola once and she is unsure if she has a follow-up visit scheduled.  She reports that  she has been having problems with her chest. She went to see [facility] ED at one point and she was MI symptoms. She has been diagnosed with LVH and diastolic dysfunction.   Otherwise, there have been no changes to her past medical history, surgical history, family history, or social history.    Review of Systems  Constitutional: Negative.  Negative for fever, malaise/fatigue and weight loss.  HENT: Negative.  Negative for congestion, ear discharge and ear pain.   Eyes:  Negative for pain, discharge  and redness.  Respiratory:  Negative for cough, sputum production, shortness of breath and wheezing.   Cardiovascular: Negative.  Negative for chest pain and palpitations.  Gastrointestinal:  Negative for abdominal pain and heartburn.  Skin: Negative.  Negative for itching and rash.  Neurological:  Negative for dizziness and headaches.  Endo/Heme/Allergies:  Negative for environmental allergies. Does not bruise/bleed easily.      Objective:   Blood pressure 130/80, pulse 74, temperature 98.2 F (36.8 C), temperature source Temporal, resp. rate 18, SpO2 96 %. There is no height or weight on file to calculate BMI.   Physical Exam:  Physical Exam Vitals reviewed.  Constitutional:      Appearance: She is well-developed.     Comments: Delightful female.  Very interactive.  HENT:     Head: Normocephalic and atraumatic.     Right Ear: Tympanic membrane, ear canal and external ear normal.     Left Ear: Tympanic membrane, ear canal and external ear normal.     Nose: No nasal deformity, septal deviation, mucosal edema or rhinorrhea.     Right Turbinates: Enlarged, swollen and pale.     Left Turbinates: Enlarged, swollen and pale.     Right Sinus: No maxillary sinus tenderness or frontal sinus tenderness.     Left Sinus: No maxillary sinus tenderness or frontal sinus tenderness.     Mouth/Throat:     Mouth: Mucous membranes are not pale and not dry.     Pharynx: Uvula midline.  Eyes:     General: Lids are normal. No allergic shiner.       Right eye: No discharge.        Left eye: No discharge.     Conjunctiva/sclera: Conjunctivae normal.     Right eye: Right conjunctiva is not injected. No chemosis.    Left eye: Left conjunctiva is not injected. No chemosis.    Pupils: Pupils are equal, round, and reactive to light.  Cardiovascular:     Rate and Rhythm: Normal rate and regular rhythm.     Heart sounds: Normal heart sounds.  Pulmonary:     Effort: Pulmonary effort is normal. No  tachypnea, accessory muscle usage or respiratory distress.     Breath sounds: Normal breath sounds. No wheezing, rhonchi or rales.     Comments: Moving air well in all lung fields.  No wheezes noted. Chest:     Chest wall: No tenderness.  Lymphadenopathy:     Cervical: No cervical adenopathy.  Skin:    Coloration: Skin is not pale.     Findings: No abrasion, erythema, petechiae or rash. Rash is not papular, urticarial or vesicular.  Neurological:     Mental Status: She is alert.  Psychiatric:        Behavior: Behavior is cooperative.     Diagnostic studies:    Spirometry: results normal (FEV1: 1.13/84%, FVC: 1.53/86%, FEV1/FVC: 74%).    Spirometry consistent with normal pattern.   Allergy Studies: none  Salvatore Marvel, MD  [facility] [place]

## 2021-04-11 ENCOUNTER — Encounter: Payer: Self-pay | Admitting: Allergy & Immunology

## 2021-04-23 ENCOUNTER — Ambulatory Visit (INDEPENDENT_AMBULATORY_CARE_PROVIDER_SITE_OTHER): Payer: Medicare Other

## 2021-04-23 ENCOUNTER — Encounter: Payer: Self-pay | Admitting: Internal Medicine

## 2021-04-23 ENCOUNTER — Other Ambulatory Visit: Payer: Self-pay

## 2021-04-23 ENCOUNTER — Ambulatory Visit (INDEPENDENT_AMBULATORY_CARE_PROVIDER_SITE_OTHER): Payer: Medicare Other | Admitting: Internal Medicine

## 2021-04-23 VITALS — BP 126/70 | HR 72 | Temp 98.2°F | Ht 61.0 in | Wt 173.1 lb

## 2021-04-23 VITALS — BP 140/80 | HR 72 | Temp 98.2°F | Ht 61.0 in | Wt 173.0 lb

## 2021-04-23 DIAGNOSIS — I129 Hypertensive chronic kidney disease with stage 1 through stage 4 chronic kidney disease, or unspecified chronic kidney disease: Secondary | ICD-10-CM

## 2021-04-23 DIAGNOSIS — Z0000 Encounter for general adult medical examination without abnormal findings: Secondary | ICD-10-CM | POA: Diagnosis not present

## 2021-04-23 DIAGNOSIS — N182 Chronic kidney disease, stage 2 (mild): Secondary | ICD-10-CM | POA: Diagnosis not present

## 2021-04-23 DIAGNOSIS — L659 Nonscarring hair loss, unspecified: Secondary | ICD-10-CM | POA: Diagnosis not present

## 2021-04-23 DIAGNOSIS — R7309 Other abnormal glucose: Secondary | ICD-10-CM | POA: Diagnosis not present

## 2021-04-23 DIAGNOSIS — I10 Essential (primary) hypertension: Secondary | ICD-10-CM | POA: Diagnosis not present

## 2021-04-23 LAB — POCT URINALYSIS DIPSTICK
Bilirubin, UA: NEGATIVE
Glucose, UA: NEGATIVE
Ketones, UA: NEGATIVE
Leukocytes, UA: NEGATIVE
Nitrite, UA: NEGATIVE
Protein, UA: NEGATIVE
Spec Grav, UA: 1.015 (ref 1.010–1.025)
Urobilinogen, UA: 0.2 E.U./dL
pH, UA: 7 (ref 5.0–8.0)

## 2021-04-23 LAB — POCT UA - MICROALBUMIN
Albumin/Creatinine Ratio, Urine, POC: <30
Creatinine, POC: 200 mg/dL
Microalbumin Ur, POC: 10 mg/L

## 2021-04-23 MED ORDER — TRIAMCINOLONE ACETONIDE 0.1 % EX CREA
1.0000 | TOPICAL_CREAM | Freq: Two times a day (BID) | CUTANEOUS | 1 refills | Status: DC | PRN
Start: 2021-04-23 — End: 2021-10-09

## 2021-04-23 NOTE — Addendum Note (Signed)
Addended by: Kellie Simmering on: 04/23/2021 03:49 PM   Modules accepted: Orders

## 2021-04-23 NOTE — Patient Instructions (Signed)
Paula Pollard , Thank you for taking time to come for your Medicare Wellness Visit. I appreciate your ongoing commitment to your health goals. Please review the following plan we discussed and let me know if I can assist you in the future.   Screening recommendations/referrals: Colonoscopy: not required Mammogram: completed 04/09/2021 Bone Density: completed 04/03/2020 Recommended yearly ophthalmology/optometry visit for glaucoma screening and checkup Recommended yearly dental visit for hygiene and checkup  Vaccinations: Influenza vaccine: decline Pneumococcal vaccine: decline Tdap vaccine: completed 04/01/2018, due 04/01/2028 Shingles vaccine: discussed   Covid-19: 08/21/2019, 07/23/2019  Advanced directives: Please bring a copy of your POA (Power of Attorney) and/or Living Will to your next appointment.   Conditions/risks identified: none  Next appointment: Follow up in one year for your annual wellness visit    Preventive Care 65 Years and Older, Female Preventive care refers to lifestyle choices and visits with your health care provider that can promote health and wellness. What does preventive care include? A yearly physical exam. This is also called an annual well check. Dental exams once or twice a year. Routine eye exams. Ask your health care provider how often you should have your eyes checked. Personal lifestyle choices, including: Daily care of your teeth and gums. Regular physical activity. Eating a healthy diet. Avoiding tobacco and drug use. Limiting alcohol use. Practicing safe sex. Taking low-dose aspirin every day. Taking vitamin and mineral supplements as recommended by your health care provider. What happens during an annual well check? The services and screenings done by your health care provider during your annual well check will depend on your age, overall health, lifestyle risk factors, and family history of disease. Counseling  Your health care provider  may ask you questions about your: Alcohol use. Tobacco use. Drug use. Emotional well-being. Home and relationship well-being. Sexual activity. Eating habits. History of falls. Memory and ability to understand (cognition). Work and work Statistician. Reproductive health. Screening  You may have the following tests or measurements: Height, weight, and BMI. Blood pressure. Lipid and cholesterol levels. These may be checked every 5 years, or more frequently if you are over 85 years old. Skin check. Lung cancer screening. You may have this screening every year starting at age 85 if you have a 30-pack-year history of smoking and currently smoke or have quit within the past 15 years. Fecal occult blood test (FOBT) of the stool. You may have this test every year starting at age 85. Flexible sigmoidoscopy or colonoscopy. You may have a sigmoidoscopy every 5 years or a colonoscopy every 10 years starting at age 85. Hepatitis C blood test. Hepatitis B blood test. Sexually transmitted disease (STD) testing. Diabetes screening. This is done by checking your blood sugar (glucose) after you have not eaten for a while (fasting). You may have this done every 1-3 years. Bone density scan. This is done to screen for osteoporosis. You may have this done starting at age 85. Mammogram. This may be done every 1-2 years. Talk to your health care provider about how often you should have regular mammograms. Talk with your health care provider about your test results, treatment options, and if necessary, the need for more tests. Vaccines  Your health care provider may recommend certain vaccines, such as: Influenza vaccine. This is recommended every year. Tetanus, diphtheria, and acellular pertussis (Tdap, Td) vaccine. You may need a Td booster every 10 years. Zoster vaccine. You may need this after age 85. Pneumococcal 13-valent conjugate (PCV13) vaccine. One dose is recommended after  age 85. Pneumococcal  polysaccharide (PPSV23) vaccine. One dose is recommended after age 85. Talk to your health care provider about which screenings and vaccines you need and how often you need them. This information is not intended to replace advice given to you by your health care provider. Make sure you discuss any questions you have with your health care provider. Document Released: 06/21/2015 Document Revised: 02/12/2016 Document Reviewed: 03/26/2015 Elsevier Interactive Patient Education  2017 [employer] Prevention in the Home Falls can cause injuries. They can happen to people of all ages. There are many things you can do to make your home safe and to help prevent falls. What can I do on the outside of my home? Regularly fix the edges of walkways and driveways and fix any cracks. Remove anything that might make you trip as you walk through a door, such as a raised step or threshold. Trim any bushes or trees on the path to your home. Use bright outdoor lighting. Clear any walking paths of anything that might make someone trip, such as rocks or tools. Regularly check to see if handrails are loose or broken. Make sure that both sides of any steps have handrails. Any raised decks and porches should have guardrails on the edges. Have any leaves, snow, or ice cleared regularly. Use sand or salt on walking paths during winter. Clean up any spills in your garage right away. This includes oil or grease spills. What can I do in the bathroom? Use night lights. Install grab bars by the toilet and in the tub and shower. Do not use towel bars as grab bars. Use non-skid mats or decals in the tub or shower. If you need to sit down in the shower, use a plastic, non-slip stool. Keep the floor dry. Clean up any water that spills on the floor as soon as it happens. Remove soap buildup in the tub or shower regularly. Attach bath mats securely with double-sided non-slip rug tape. Do not have throw rugs and other  things on the floor that can make you trip. What can I do in the bedroom? Use night lights. Make sure that you have a light by your bed that is easy to reach. Do not use any sheets or blankets that are too big for your bed. They should not hang down onto the floor. Have a firm chair that has side arms. You can use this for support while you get dressed. Do not have throw rugs and other things on the floor that can make you trip. What can I do in the kitchen? Clean up any spills right away. Avoid walking on wet floors. Keep items that you use a lot in easy-to-reach places. If you need to reach something above you, use a strong step stool that has a grab bar. Keep electrical cords out of the way. Do not use floor polish or wax that makes floors slippery. If you must use wax, use non-skid floor wax. Do not have throw rugs and other things on the floor that can make you trip. What can I do with my stairs? Do not leave any items on the stairs. Make sure that there are handrails on both sides of the stairs and use them. Fix handrails that are broken or loose. Make sure that handrails are as long as the stairways. Check any carpeting to make sure that it is firmly attached to the stairs. Fix any carpet that is loose or worn. Avoid having throw rugs  at the top or bottom of the stairs. If you do have throw rugs, attach them to the floor with carpet tape. Make sure that you have a light switch at the top of the stairs and the bottom of the stairs. If you do not have them, ask someone to add them for you. What else can I do to help prevent falls? Wear shoes that: Do not have high heels. Have rubber bottoms. Are comfortable and fit you well. Are closed at the toe. Do not wear sandals. If you use a stepladder: Make sure that it is fully opened. Do not climb a closed stepladder. Make sure that both sides of the stepladder are locked into place. Ask someone to hold it for you, if possible. Clearly  mark and make sure that you can see: Any grab bars or handrails. First and last steps. Where the edge of each step is. Use tools that help you move around (mobility aids) if they are needed. These include: Canes. Walkers. Scooters. Crutches. Turn on the lights when you go into a dark area. Replace any light bulbs as soon as they burn out. Set up your furniture so you have a clear path. Avoid moving your furniture around. If any of your floors are uneven, fix them. If there are any pets around you, be aware of where they are. Review your medicines with your doctor. Some medicines can make you feel dizzy. This can increase your chance of falling. Ask your doctor what other things that you can do to help prevent falls. This information is not intended to replace advice given to you by your health care provider. Make sure you discuss any questions you have with your health care provider. Document Released: 03/21/2009 Document Revised: 10/31/2015 Document Reviewed: 06/29/2014 Elsevier Interactive Patient Education  2017 [employer].

## 2021-04-23 NOTE — Patient Instructions (Signed)

## 2021-04-23 NOTE — Progress Notes (Signed)
This visit occurred during the SARS-CoV-2 public health emergency.  Safety protocols were in place, including screening questions prior to the visit, additional usage of staff PPE, and extensive cleaning of exam room while observing appropriate contact time as indicated for disinfecting solutions.  Subjective:   Paula Pollard is a 87 y.o. female who presents for Medicare Annual (Subsequent) preventive examination.  Review of Systems     Cardiac Risk Factors include: advanced age (>83men, >74 women);hypertension;obesity (BMI >30kg/m2);sedentary lifestyle     Objective:    Today's Vitals   04/23/21 0959  BP: (!) 142/80  Pulse: 72  Temp: 98.2 F (36.8 C)  TempSrc: Oral  SpO2: 97%  Weight: 173 lb (78.5 kg)  Height: 5\' 1"  (1.549 m)   Body mass index is 32.69 kg/m.  Advanced Directives 04/23/2021 12/29/2020 04/18/2020 04/12/2019 10/28/2018 04/06/2018 12/31/2017  Does Patient Have a Medical Advance Directive? Yes Yes Yes Yes No No No  Type of Paramedic of [place];Living will [place];Living will [place];Living will [place];Living will - - -  Does patient want to make changes to medical advance directive? - No - Patient declined - - - - -  Copy of [place] in Chart? No - copy requested No - copy requested No - copy requested No - copy requested - - -  Would patient like information on creating a medical advance directive? - - - - No - Patient declined No - Patient declined Yes (MAU/Ambulatory/Procedural Areas - Information given)    Current Medications (verified) Outpatient Encounter Medications as of 04/23/2021  Medication Sig   albuterol (VENTOLIN HFA) 108 (90 Base) MCG/ACT inhaler Inhale 2 puffs into the lungs every 4 (four) hours as needed for wheezing or shortness of breath.   Ascorbic Acid (VITAMIN C PO) Take 1 tablet by mouth daily.   aspirin EC 81 MG tablet Take 81 mg  by mouth daily.   atorvastatin (LIPITOR) 40 MG tablet Take 40 mg by mouth daily.   azelastine (ASTELIN) 0.1 % nasal spray Place 1 spray into both nostrils daily. Use in each nostril as directed   Azelastine-Fluticasone (DYMISTA) 137-50 MCG/ACT SUSP Place 1 spray into both nostrils 2 (two) times daily as needed.   Calcium 200 MG TABS Take 200 mg by mouth daily.   cholecalciferol (VITAMIN D) 400 units TABS tablet Take 1,200 Units by mouth daily.   Cyanocobalamin (B-12 PO) Take 1 tablet by mouth as needed (for energy).   esomeprazole (NEXIUM) 20 MG capsule Take 20 mg by mouth as needed (for heartburn).   fluticasone (FLONASE) 50 MCG/ACT nasal spray Place 1 spray into both nostrils daily.   fluticasone-salmeterol (ADVAIR HFA) 115-21 MCG/ACT inhaler Inhale 2 puffs into the lungs 2 (two) times daily.   hydrochlorothiazide (HYDRODIURIL) 12.5 MG tablet TAKE 1 TABLET BY MOUTH EVERY DAY   LORazepam (ATIVAN) 0.5 MG tablet Take 1 tablet (0.5 mg total) by mouth every 12 (twelve) hours as needed for anxiety.   meclizine (ANTIVERT) 25 MG tablet    Multiple Vitamins-Minerals (ECHINACEA ACZ PO) Take 1 capsule by mouth as needed (for immune support).   nebivolol (BYSTOLIC) 5 MG tablet Take 1 tablet (5 mg total) by mouth daily.   nitroGLYCERIN (NITROSTAT) 0.4 MG SL tablet    Polyethyl Glycol-Propyl Glycol 0.4-0.3 % SOLN Apply 1 drop to eye 2 (two) times a day. systane   telmisartan (MICARDIS) 20 MG tablet TAKE 1 TABLET BY MOUTH EVERY DAY  triamcinolone cream (KENALOG) 0.1 % Apply 1 application topically 2 (two) times daily as needed.   vitamin E 400 UNIT capsule Take 400 Units by mouth daily.   [DISCONTINUED] albuterol (VENTOLIN HFA) 108 (90 Base) MCG/ACT inhaler Inhale 2 puffs into the lungs every 6 (six) hours as needed for wheezing or shortness of breath.   [DISCONTINUED] Calcium Carbonate-Vitamin D (CALCIUM-VITAMIN D3 PO) Take 1 tablet by mouth every morning. 1200mg  of calcium   [DISCONTINUED] fluticasone  (FLONASE) 50 MCG/ACT nasal spray Place 2 sprays into both nostrils daily as needed for allergies or rhinitis.   [DISCONTINUED] hydrochlorothiazide (HYDRODIURIL) 12.5 MG tablet TAKE 1 TABLET BY MOUTH EVERY DAY   [DISCONTINUED] sertraline (ZOLOFT) 25 MG tablet TAKE 1 TABLET BY MOUTH EVERY DAY (Patient not taking: No sig reported)   [DISCONTINUED] telmisartan (MICARDIS) 20 MG tablet TAKE 1 TABLET BY MOUTH EVERY DAY   [DISCONTINUED] triamterene-hydrochlorothiazide (MAXZIDE-25) 37.5-25 MG tablet Take 1/2 tablet by mouth daily (Patient not taking: Reported on 08/12/2020)   No facility-administered encounter medications on file as of 04/23/2021.    Allergies (verified) Codeine and Doxycycline   History: Past Medical History:  Diagnosis Date   Anemia    "in past as a young girl"   Anxiety    Arthritis    Asthma    Bronchitis    hx of   Cataract    bilaterally, "no surgery at this time"   Diabetes mellitus without complication ([place])    pre diabetic   Dysrhythmia    when gets excited   GERD (gastroesophageal reflux disease)    Headache(784.0)    hx of migraines   History of hiatal hernia    Hyperlipidemia    Hypertension    sees Dr. Terrence Dupont   Neuromuscular disorder [facility])    hx of carpal tunnel "never had surgery for"right hand   Past Surgical History:  Procedure Laterality Date   CARDIAC CATHETERIZATION     5 years ago no problems   COLONOSCOPY  07/2018   polypectomy x 5   DILATATION & CURETTAGE/HYSTEROSCOPY WITH MYOSURE N/A 01/06/2018   Procedure: DILATATION & CURETTAGE/HYSTEROSCOPY WITH MYOSURE;  Surgeon: Servando Salina, MD;  Location: [facility];  Service: Gynecology;  Laterality: N/A;   DOPPLER ECHOCARDIOGRAPHY     hx of   NECK SURGERY     2002, cyst removed,    removal of toe nails     both feet   TOTAL HIP ARTHROPLASTY Right 08/08/2012   Dr Mayer Camel   TOTAL HIP ARTHROPLASTY Right 08/08/2012   Procedure: TOTAL HIP ARTHROPLASTY;  Surgeon: Kerin Salen, MD;  Location: [facility];   Service: Orthopedics;  Laterality: Right;   TUBAL LIGATION     1970   Family History  Problem Relation Age of Onset   Heart attack Mother    Heart attack Father    Stroke Father    Stroke Daughter    Colon cancer Sister    Social History   Socioeconomic History   Marital status: Divorced    Spouse name: Not on file   Number of children: Not on file   Years of education: Not on file   Highest education level: Not on file  Occupational History   Occupation: retired  Tobacco Use   Smoking status: Never   Smokeless tobacco: Never  Vaping Use   Vaping Use: Never used  Substance and Sexual Activity   Alcohol use: No   Drug use: No   Sexual activity: Not Currently  Other Topics  Concern   Not on file  Social History Narrative   Not on file   Social Determinants of Health   Financial Resource Strain: Low Risk    Difficulty of Paying Living Expenses: Not hard at all  Food Insecurity: No Food Insecurity   Worried About Charity fundraiser in the Last Year: Never true   [place] in the Last Year: Never true  Transportation Needs: No Transportation Needs   Lack of Transportation (Medical): No   Lack of Transportation (Non-Medical): No  Physical Activity: Inactive   Days of Exercise per Week: 0 days   Minutes of Exercise per Session: 0 min  Stress: No Stress Concern Present   Feeling of Stress : Not at all  Social Connections: Not on file    Tobacco Counseling Counseling given: Not Answered   Clinical Intake:  Pre-visit preparation completed: Yes  Pain : No/denies pain     Nutritional Status: BMI > 30  Obese Nutritional Risks: None  How often do you need to have someone help you when you read instructions, pamphlets, or other written materials from your doctor or pharmacy?: 1 - Never What is the last grade level you completed in school?: 12th grade  Diabetic? no  Interpreter Needed?: No  Information entered by :: NAllen LPN   Activities of Daily  Living In your present state of health, do you have any difficulty performing the following activities: 04/23/2021  Hearing? N  Vision? N  Difficulty concentrating or making decisions? N  Walking or climbing stairs? N  Dressing or bathing? N  Doing errands, shopping? N  Preparing Food and eating ? N  Using the Toilet? N  In the past six months, have you accidently leaked urine? N  Do you have problems with loss of bowel control? N  Managing your Medications? N  Managing your Finances? N  Housekeeping or managing your Housekeeping? N  Some recent data might be hidden    Patient Care Team: Glendale Chard, MD as PCP - General (Internal Medicine) Rutherford Guys, MD as Consulting Physician (Ophthalmology)  Indicate any recent Medical Services you may have received from other than [facility] providers in the past year (date may be approximate).     Assessment:   This is a routine wellness examination for [place].  Hearing/Vision screen No results found.  Dietary issues and exercise activities discussed: Current Exercise Habits: The patient does not participate in regular exercise at present   Goals Addressed             This Visit's Progress    Patient Stated       04/22/2021, remain independent       Depression Screen PHQ 2/9 Scores 04/23/2021 04/18/2020 10/04/2019 04/12/2019 11/21/2018 10/18/2018 05/19/2018  PHQ - 2 Score 0 1 0 0 0 0 0  PHQ- 9 Score - 1 - 0 - - -    Fall Risk Fall Risk  04/23/2021 04/18/2020 10/04/2019 04/12/2019 11/21/2018  Falls in the past year? 1 0 0 0 0  Comment hit corner of a wall - - - -  Number falls in past yr: 0 - 0 0 -  Injury with Fall? 1 - 0 - -  Comment cut head - - - -  Risk for fall due to : Medication side effect Medication side effect - Medication side effect -  Follow up Falls evaluation completed;Education provided;Falls prevention discussed Falls evaluation completed;Education provided;Falls prevention discussed - Falls evaluation  completed;Education provided;Falls prevention  discussed -    FALL RISK PREVENTION PERTAINING TO THE HOME:  Any stairs in or around the home? Yes  If so, are there any without handrails? No  Home free of loose throw rugs in walkways, pet beds, electrical cords, etc? Yes  Adequate lighting in your home to reduce risk of falls? Yes   ASSISTIVE DEVICES UTILIZED TO PREVENT FALLS:  Life alert? No  Use of a cane, walker or w/c? No  Grab bars in the bathroom? Yes  Shower chair or bench in shower? Yes  Elevated toilet seat or a handicapped toilet? Yes   TIMED UP AND GO:  Was the test performed? No .    Gait slow and steady without use of assistive device  Cognitive Function:     6CIT Screen 04/23/2021 04/18/2020 04/12/2019 04/06/2018  What Year? 0 points 0 points 0 points 0 points  What month? 0 points 0 points 0 points 0 points  What time? 0 points 0 points 0 points 0 points  Count back from 20 0 points 0 points 0 points 0 points  Months in reverse 2 points 2 points 2 points 2 points  Repeat phrase 4 points 4 points 2 points 0 points  Total Score 6 6 4 2     Immunizations Immunization History  Administered Date(s) Administered   Moderna Sars-Covid-2 Vaccination 07/23/2019, 08/21/2019   Pneumococcal Polysaccharide-23 04/18/2020    TDAP status: Up to date  Flu Vaccine status: Declined, Education has been provided regarding the importance of this vaccine but patient still declined. Advised may receive this vaccine at local pharmacy or Health Dept. Aware to provide a copy of the vaccination record if obtained from local pharmacy or Health Dept. Verbalized acceptance and understanding.  Pneumococcal vaccine status: Declined,  Education has been provided regarding the importance of this vaccine but patient still declined. Advised may receive this vaccine at local pharmacy or Health Dept. Aware to provide a copy of the vaccination record if obtained from local pharmacy or Health Dept.  Verbalized acceptance and understanding.   Covid-19 vaccine status: Completed vaccines  Qualifies for Shingles Vaccine? Yes   Zostavax completed No   Shingrix Completed?: No.    Education has been provided regarding the importance of this vaccine. Patient has been advised to call insurance company to determine out of pocket expense if they have not yet received this vaccine. Advised may also receive vaccine at local pharmacy or Health Dept. Verbalized acceptance and understanding.  Screening Tests Health Maintenance  Topic Date Due   Zoster Vaccines- Shingrix (1 of 2) Never done   COVID-19 Vaccine (3 - Moderna risk series) 09/18/2019   INFLUENZA VACCINE  Never done   MAMMOGRAM  04/03/2021   Pneumonia Vaccine 60+ Years old (2 - PCV) 04/18/2021   TETANUS/TDAP  04/01/2028   DEXA SCAN  Completed   HPV VACCINES  Aged Out    Health Maintenance  Health Maintenance Due  Topic Date Due   Zoster Vaccines- Shingrix (1 of 2) Never done   COVID-19 Vaccine (3 - Moderna risk series) 09/18/2019   INFLUENZA VACCINE  Never done   MAMMOGRAM  04/03/2021   Pneumonia Vaccine 48+ Years old (2 - PCV) 04/18/2021    Colorectal cancer screening: No longer required.   Mammogram status: Completed 04/09/2021. Repeat every year  Bone Density status: Completed 04/03/2020  Lung Cancer Screening: (Low Dose CT Chest recommended if Age 66-80 years, 30 pack-year currently smoking OR have quit w/in 15years.) does not qualify.  Lung Cancer Screening Referral: no  Additional Screening:  Hepatitis C Screening: does not qualify  Vision Screening: Recommended annual ophthalmology exams for early detection of glaucoma and other disorders of the eye. Is the patient up to date with their annual eye exam?  Yes  Who is the provider or what is the name of the office in which the patient attends annual eye exams? Dr. Montez Morita If pt is not established with a provider, would they like to be referred to a provider to  establish care? No .   Dental Screening: Recommended annual dental exams for proper oral hygiene  Community Resource Referral / Chronic Care Management: CRR required this visit?  No   CCM required this visit?  No      Plan:     I have personally reviewed and noted the following in the patient's chart:   Medical and social history Use of alcohol, tobacco or illicit drugs  Current medications and supplements including opioid prescriptions.  Functional ability and status Nutritional status Physical activity Advanced directives List of other physicians Hospitalizations, surgeries, and ER visits in previous 12 months Vitals Screenings to include cognitive, depression, and falls Referrals and appointments  In addition, I have reviewed and discussed with patient certain preventive protocols, quality metrics, and best practice recommendations. A written personalized care plan for preventive services as well as general preventive health recommendations were provided to patient.     Kellie Simmering, LPN   05/27/7587   Nurse Notes: none

## 2021-04-23 NOTE — Progress Notes (Signed)
Rich Brave [place],acting as a Education administrator for [name], MD.,have documented all relevant documentation on the behalf of [name], MD,as directed by  [name], MD while in the presence of [name], MD.  This visit occurred during the SARS-CoV-2 public health emergency.  Safety protocols were in place, including screening questions prior to the visit, additional usage of staff PPE, and extensive cleaning of exam room while observing appropriate contact time as indicated for disinfecting solutions.  Subjective:     Patient ID: Paula Pollard , female    DOB: Oct 13, 1929 , 87 y.o.   MRN: [id number]   Chief Complaint  Patient presents with   Hypertension    HPI  She is here today for BP check. She reports compliance with meds. She denies headaches, palpitations and shortness of breath.   She is also scheduled to see [place] for AWV.   Hypertension This is a chronic problem. The current episode started more than 1 year ago. The problem has been gradually improving since onset. The problem is controlled. Pertinent negatives include no blurred vision, chest pain, palpitations or shortness of breath. Risk factors for coronary artery disease include sedentary lifestyle, obesity, stress and post-menopausal state. There are no compliance problems.  Hypertensive end-organ damage includes kidney disease.    Past Medical History:  Diagnosis Date   Anemia    "in past as a young girl"   Anxiety    Arthritis    Asthma    Bronchitis    hx of   Cataract    bilaterally, "no surgery at this time"   Diabetes mellitus without complication ([place])    pre diabetic   Dysrhythmia    when gets excited   GERD (gastroesophageal reflux disease)    Headache(784.0)    hx of migraines   History of hiatal hernia    Hyperlipidemia    Hypertension    sees Dr. Terrence Dupont   Neuromuscular disorder ([place])    hx of carpal tunnel "never had surgery for"right hand     Family History   Problem Relation Age of Onset   Heart attack Mother    Heart attack Father    Stroke Father    Stroke Daughter    Colon cancer Sister      Current Outpatient Medications:    albuterol (VENTOLIN HFA) 108 (90 Base) MCG/ACT inhaler, Inhale 2 puffs into the lungs every 4 (four) hours as needed for wheezing or shortness of breath., Disp: 18 each, Rfl: 1   Ascorbic Acid (VITAMIN C PO), Take 1 tablet by mouth daily., Disp: , Rfl:    aspirin EC 81 MG tablet, Take 81 mg by mouth daily., Disp: , Rfl:    atorvastatin (LIPITOR) 40 MG tablet, Take 40 mg by mouth daily., Disp: , Rfl:    azelastine (ASTELIN) 0.1 % nasal spray, Place 1 spray into both nostrils daily. Use in each nostril as directed, Disp: 30 mL, Rfl: 5   Azelastine-Fluticasone (DYMISTA) 137-50 MCG/ACT SUSP, Place 1 spray into both nostrils 2 (two) times daily as needed., Disp: 23 g, Rfl: 5   Calcium 200 MG TABS, Take 200 mg by mouth daily., Disp: , Rfl:    cholecalciferol (VITAMIN D) 400 units TABS tablet, Take 1,200 Units by mouth daily., Disp: , Rfl:    Cyanocobalamin (B-12 PO), Take 1 tablet by mouth as needed (for energy)., Disp: , Rfl:    esomeprazole (NEXIUM) 20 MG capsule, Take 20 mg by mouth as  needed (for heartburn)., Disp: , Rfl:    fluticasone (FLONASE) 50 MCG/ACT nasal spray, Place 1 spray into both nostrils daily., Disp: 16 mL, Rfl: 5   fluticasone-salmeterol (ADVAIR HFA) 115-21 MCG/ACT inhaler, Inhale 2 puffs into the lungs 2 (two) times daily., Disp: 1 each, Rfl: 5   hydrochlorothiazide (HYDRODIURIL) 12.5 MG tablet, TAKE 1 TABLET BY MOUTH EVERY DAY, Disp: 90 tablet, Rfl: 1   LORazepam (ATIVAN) 0.5 MG tablet, Take 1 tablet (0.5 mg total) by mouth every 12 (twelve) hours as needed for anxiety., Disp: 30 tablet, Rfl: 0   meclizine (ANTIVERT) 25 MG tablet, , Disp: , Rfl:    Multiple Vitamins-Minerals (ECHINACEA ACZ PO), Take 1 capsule by mouth as needed (for immune support)., Disp: , Rfl:    nebivolol (BYSTOLIC) 5 MG tablet,  Take 1 tablet (5 mg total) by mouth daily., Disp: 30 tablet, Rfl: 0   nitroGLYCERIN (NITROSTAT) 0.4 MG SL tablet, , Disp: , Rfl:    Polyethyl Glycol-Propyl Glycol 0.4-0.3 % SOLN, Apply 1 drop to eye 2 (two) times a day. systane, Disp: , Rfl:    telmisartan (MICARDIS) 20 MG tablet, TAKE 1 TABLET BY MOUTH EVERY DAY, Disp: 90 tablet, Rfl: 1   triamcinolone cream (KENALOG) 0.1 %, Apply 1 application topically 2 (two) times daily as needed., Disp: 30 g, Rfl: 1   vitamin E 400 UNIT capsule, Take 400 Units by mouth daily., Disp: , Rfl:    Allergies  Allergen Reactions   Codeine Nausea Only and Other (See Comments)    Reaction:Dizziness and hallucinations "makes me climb walls"   Doxycycline Other (See Comments)     Review of Systems  Constitutional: Negative.   Eyes:  Negative for blurred vision.  Respiratory: Negative.  Negative for shortness of breath.   Cardiovascular: Negative.  Negative for chest pain and palpitations.  Gastrointestinal: Negative.  Negative for rectal pain.  Skin:        She c/o hair loss. States she has been losing handfuls of hair for the past several months.   Neurological: Negative.   Psychiatric/Behavioral: Negative.      Today's Vitals   04/23/21 1007 04/23/21 1104  BP: (!) 142/80 126/70  Pulse: 72   Temp: 98.2 F (36.8 C)   Weight: 173 lb 1 oz (78.5 kg)   Height: '5\' 1"'  (1.549 m)   PainSc: 0-No pain    Body mass index is 32.7 kg/m.   Objective:  Physical Exam Vitals and nursing note reviewed.  Constitutional:      Appearance: Normal appearance.  HENT:     Head: Normocephalic and atraumatic.     Nose:     Comments: Masked     Mouth/Throat:     Comments: Masked  Eyes:     Extraocular Movements: Extraocular movements intact.  Cardiovascular:     Rate and Rhythm: Normal rate and regular rhythm.     Heart sounds: Normal heart sounds.  Pulmonary:     Effort: Pulmonary effort is normal.     Breath sounds: Normal breath sounds.  Musculoskeletal:      Cervical back: Normal range of motion.  Skin:    General: Skin is warm.  Neurological:     General: No focal deficit present.     Mental Status: She is alert.  Psychiatric:        Mood and Affect: Mood normal.        Behavior: Behavior normal.        Assessment And Plan:  1. Hypertensive nephropathy Comments: Chronic, uncontrolled. Repeat BP 126/70. NO need for med changes. Encouraged to follow low sodium diet. I will check labs as below. She will f/u 6 months.  - CMP14+EGFR - CBC - Lipid panel - TSH  2. Chronic renal disease, stage II Comments: Chronic, I will check labs as listed below. Encouraged to stay well hydrated and keep BP controlled to decrease risk of CKD progression.   3. Hair loss Comments: She is encouraged to increase water intake and healthy fats. I will check labs as listed below.  - TSH - ANA, IFA (with reflex)  4. Other abnormal glucose Comments: Her a1c has been elevated in the past. I will recheck this today. She is encouraged to limit her intake of sweetened beverages, including diet drinks.  - Hemoglobin A1c   Patient was given opportunity to ask questions. Patient verbalized understanding of the plan and was able to repeat key elements of the plan. All questions were answered to their satisfaction.   I, [name], MD, have reviewed all documentation for this visit. The documentation on 04/23/21 for the exam, diagnosis, procedures, and orders are all accurate and complete.   IF YOU HAVE BEEN REFERRED TO A SPECIALIST, IT MAY TAKE 1-2 WEEKS TO SCHEDULE/PROCESS THE REFERRAL. IF YOU HAVE NOT HEARD FROM US/SPECIALIST IN TWO WEEKS, PLEASE GIVE [place] A CALL AT [phone].   THE PATIENT IS ENCOURAGED TO PRACTICE SOCIAL DISTANCING DUE TO THE COVID-19 PANDEMIC.

## 2021-04-25 LAB — CMP14+EGFR
ALT: 16 IU/L (ref 0–32)
AST: 25 IU/L (ref 0–40)
Albumin/Globulin Ratio: 1.4 (ref 1.2–2.2)
Albumin: 4 g/dL (ref 3.6–4.6)
Alkaline Phosphatase: 63 IU/L (ref 44–121)
BUN/Creatinine Ratio: 15 (ref 12–28)
BUN: 13 mg/dL (ref 8–27)
Bilirubin Total: 0.5 mg/dL (ref 0.0–1.2)
CO2: 27 mmol/L (ref 20–29)
Calcium: 9.3 mg/dL (ref 8.7–10.3)
Chloride: 100 mmol/L (ref 96–106)
Creatinine, Ser: 0.88 mg/dL (ref 0.57–1.00)
Globulin, Total: 2.9 g/dL (ref 1.5–4.5)
Glucose: 97 mg/dL (ref 70–99)
Potassium: 4.1 mmol/L (ref 3.5–5.2)
Sodium: 142 mmol/L (ref 134–144)
Total Protein: 6.9 g/dL (ref 6.0–8.5)
eGFR: 63 mL/min/{1.73_m2} (ref >59)

## 2021-04-25 LAB — LIPID PANEL
Chol/HDL Ratio: 2.3 ratio (ref 0.0–4.4)
Cholesterol, Total: 147 mg/dL (ref 100–199)
HDL: 63 mg/dL (ref >39)
LDL Chol Calc (NIH): 70 mg/dL (ref 0–99)
Triglycerides: 69 mg/dL (ref 0–149)
VLDL Cholesterol Cal: 14 mg/dL (ref 5–40)

## 2021-04-25 LAB — TSH: TSH: 3.25 u[IU]/mL (ref 0.450–4.500)

## 2021-04-25 LAB — CBC
Hematocrit: 38.2 % (ref 34.0–46.6)
Hemoglobin: 12.6 g/dL (ref 11.1–15.9)
MCH: 29.4 pg (ref 26.6–33.0)
MCHC: 33 g/dL (ref 31.5–35.7)
MCV: 89 fL (ref 79–97)
Platelets: 207 10*3/uL (ref 150–450)
RBC: 4.28 x10E6/uL (ref 3.77–5.28)
RDW: 12.7 % (ref 11.7–15.4)
WBC: 3.6 10*3/uL (ref 3.4–10.8)

## 2021-04-25 LAB — ANTINUCLEAR ANTIBODIES, IFA: ANA Titer 1: NEGATIVE

## 2021-04-25 LAB — HEMOGLOBIN A1C
Est. average glucose Bld gHb Est-mCnc: 120 mg/dL
Hgb A1c MFr Bld: 5.8 % — ABNORMAL HIGH (ref 4.8–5.6)

## 2021-05-13 ENCOUNTER — Encounter: Payer: Self-pay | Admitting: Internal Medicine

## 2021-05-13 DIAGNOSIS — I38 Endocarditis, valve unspecified: Secondary | ICD-10-CM | POA: Diagnosis not present

## 2021-05-13 DIAGNOSIS — I2510 Atherosclerotic heart disease of native coronary artery without angina pectoris: Secondary | ICD-10-CM | POA: Diagnosis not present

## 2021-05-13 DIAGNOSIS — E785 Hyperlipidemia, unspecified: Secondary | ICD-10-CM | POA: Diagnosis not present

## 2021-05-13 DIAGNOSIS — I10 Essential (primary) hypertension: Secondary | ICD-10-CM | POA: Diagnosis not present

## 2021-05-13 DIAGNOSIS — I5030 Unspecified diastolic (congestive) heart failure: Secondary | ICD-10-CM | POA: Diagnosis not present

## 2021-05-20 DIAGNOSIS — H2513 Age-related nuclear cataract, bilateral: Secondary | ICD-10-CM | POA: Diagnosis not present

## 2021-05-20 DIAGNOSIS — H25013 Cortical age-related cataract, bilateral: Secondary | ICD-10-CM | POA: Diagnosis not present

## 2021-07-15 ENCOUNTER — Other Ambulatory Visit: Payer: Self-pay

## 2021-07-15 ENCOUNTER — Emergency Department (HOSPITAL_COMMUNITY)
Admission: EM | Admit: 2021-07-15 | Discharge: 2021-07-15 | Disposition: A | Discharge status: Home / Self Care | Payer: Medicare Other | Attending: Emergency Medicine | Admitting: Emergency Medicine

## 2021-07-15 ENCOUNTER — Emergency Department (HOSPITAL_COMMUNITY): Payer: Medicare Other

## 2021-07-15 DIAGNOSIS — R0789 Other chest pain: Secondary | ICD-10-CM | POA: Diagnosis not present

## 2021-07-15 DIAGNOSIS — I10 Essential (primary) hypertension: Secondary | ICD-10-CM | POA: Diagnosis not present

## 2021-07-15 DIAGNOSIS — H5509 Other forms of nystagmus: Secondary | ICD-10-CM | POA: Diagnosis not present

## 2021-07-15 DIAGNOSIS — R55 Syncope and collapse: Secondary | ICD-10-CM | POA: Diagnosis not present

## 2021-07-15 DIAGNOSIS — E119 Type 2 diabetes mellitus without complications: Secondary | ICD-10-CM | POA: Insufficient documentation

## 2021-07-15 DIAGNOSIS — I517 Cardiomegaly: Secondary | ICD-10-CM | POA: Diagnosis not present

## 2021-07-15 DIAGNOSIS — R42 Dizziness and giddiness: Secondary | ICD-10-CM | POA: Diagnosis not present

## 2021-07-15 DIAGNOSIS — Z7982 Long term (current) use of aspirin: Secondary | ICD-10-CM | POA: Diagnosis not present

## 2021-07-15 DIAGNOSIS — R110 Nausea: Secondary | ICD-10-CM | POA: Diagnosis not present

## 2021-07-15 DIAGNOSIS — I447 Left bundle-branch block, unspecified: Secondary | ICD-10-CM | POA: Diagnosis not present

## 2021-07-15 DIAGNOSIS — Z79899 Other long term (current) drug therapy: Secondary | ICD-10-CM | POA: Insufficient documentation

## 2021-07-15 DIAGNOSIS — R002 Palpitations: Secondary | ICD-10-CM | POA: Diagnosis not present

## 2021-07-15 LAB — CBC WITH DIFFERENTIAL/PLATELET
Abs Immature Granulocytes: 0.02 10*3/uL (ref 0.00–0.07)
Basophils Absolute: 0 10*3/uL (ref 0.0–0.1)
Basophils Relative: 1 %
Eosinophils Absolute: 0.1 10*3/uL (ref 0.0–0.5)
Eosinophils Relative: 1 %
HCT: 39 % (ref 36.0–46.0)
Hemoglobin: 12.2 g/dL (ref 12.0–15.0)
Immature Granulocytes: 0 %
Lymphocytes Relative: 19 %
Lymphs Abs: 0.9 10*3/uL (ref 0.7–4.0)
MCH: 29.2 pg (ref 26.0–34.0)
MCHC: 31.3 g/dL (ref 30.0–36.0)
MCV: 93.3 fL (ref 80.0–100.0)
Monocytes Absolute: 0.4 10*3/uL (ref 0.1–1.0)
Monocytes Relative: 8 %
Neutro Abs: 3.4 10*3/uL (ref 1.7–7.7)
Neutrophils Relative %: 71 %
Platelets: 217 10*3/uL (ref 150–400)
RBC: 4.18 MIL/uL (ref 3.87–5.11)
RDW: 13 % (ref 11.5–15.5)
WBC: 4.8 10*3/uL (ref 4.0–10.5)
nRBC: 0 % (ref 0.0–0.2)

## 2021-07-15 LAB — URINALYSIS, ROUTINE W REFLEX MICROSCOPIC
Bilirubin Urine: NEGATIVE
Glucose, UA: NEGATIVE mg/dL
Hgb urine dipstick: NEGATIVE
Ketones, ur: NEGATIVE mg/dL
Leukocytes,Ua: NEGATIVE
Nitrite: NEGATIVE
Protein, ur: NEGATIVE mg/dL
Specific Gravity, Urine: 1.004 — ABNORMAL LOW (ref 1.005–1.030)
pH: 7 (ref 5.0–8.0)

## 2021-07-15 LAB — I-STAT CHEM 8, ED
BUN: 14 mg/dL (ref 8–23)
Calcium, Ion: 1.09 mmol/L — ABNORMAL LOW (ref 1.15–1.40)
Chloride: 103 mmol/L (ref 98–111)
Creatinine, Ser: 0.6 mg/dL (ref 0.44–1.00)
Glucose, Bld: 114 mg/dL — ABNORMAL HIGH (ref 70–99)
HCT: 37 % (ref 36.0–46.0)
Hemoglobin: 12.6 g/dL (ref 12.0–15.0)
Potassium: 3.6 mmol/L (ref 3.5–5.1)
Sodium: 140 mmol/L (ref 135–145)
TCO2: 28 mmol/L (ref 22–32)

## 2021-07-15 LAB — TROPONIN I (HIGH SENSITIVITY): Troponin I (High Sensitivity): 15 ng/L (ref <18)

## 2021-07-15 MED ORDER — MECLIZINE HCL 25 MG PO TABS
25.0000 mg | ORAL_TABLET | Freq: Once | ORAL | Status: AC
Start: 2021-07-15 — End: 2021-07-15
  Administered 2021-07-15: 25 mg via ORAL
  Filled 2021-07-15: qty 1

## 2021-07-15 MED ORDER — ONDANSETRON HCL 4 MG/2ML IJ SOLN
4.0000 mg | Freq: Once | INTRAMUSCULAR | Status: AC
  Administered 2021-07-15: 4 mg via INTRAVENOUS
  Filled 2021-07-15: qty 2

## 2021-07-15 MED ORDER — SODIUM CHLORIDE 0.9 % IV BOLUS
500.0000 mL | Freq: Once | INTRAVENOUS | Status: AC
  Administered 2021-07-15: 500 mL via INTRAVENOUS

## 2021-07-15 NOTE — ED Notes (Signed)
DC instructions reviewed with pt. PT verbalized understanding. Pt DC °

## 2021-07-15 NOTE — ED Provider Notes (Signed)
°  Physical Exam  BP (!) 150/89    Pulse 82    Temp 97.9 F (36.6 C) (Oral)    Resp 16    Ht 5\' 1"  (1.549 m)    Wt 77.1 kg    SpO2 100%    BMI 32.12 kg/m     Procedures  Procedures  ED Course / MDM    Medical Decision Making Amount and/or Complexity of Data Reviewed Labs: ordered. Radiology: ordered.  Risk Prescription drug management. Decision regarding hospitalization.   55F, presenting with vertigo sx consistent with know hx of vertigo. Lab workup pending, likely IVF, Meclizine, and DC.   On my evaluation, the patient had a positive Dix-Hallpike with nystagmus and vertigo elicited on the left.  She had a normal exam on the right.  The Epley maneuver was performed with moderate improvement in her symptoms.  She did endorse persistent sensation of vertigo.  Symptoms are consistent with her known diagnosis of BPPV for which she follows up with ENT.  She has been treated with IV fluids, meclizine, Zofran.  Laboratory work-up significant for CBC without a leukocytosis or anemia, urinalysis negative for UTI with no hematuria noted, i-STAT Chem-8 with no evidence of abnormal renal function, normal electrolytes.  On further evaluation of the patient, she does state that she had an episode of chest pressure earlier today with palpitations that came on suddenly while standing up while on the toilet.  She had profuse diaphoresis with associated pressure.  Symptoms have since resolved.  She endorsed near syncope but did not have an episode of syncope.    I reviewed the patient's EKG which shows sinus rhythm, left bundle branch block which is old, no significant changes from prior.  Cardiac troponin was collected, resulted normal.  The remainder of the patient's laboratory work-up was unremarkable.  She has had no recurrence of her symptoms.  She has been monitored on cardiac telemetry in the ED for several hours with no adverse events noted.  She has had no syncopal episodes.  Her vertigo is  consistent with BPPV which is well-known to the patient for which she follows outpatient.  She felt symptomatically improved following the above interventions and Epley maneuver.  Discussed inpatient observation versus discharge with the patient given her near syncopal episode and chest pressure.  She has no symptoms of ACS at this time.  Low concern for acute PE.  No evidence for cardiac dysrhythmia on telemetry.  I explained to the patient that I do not see a clear indication for hospitalization at this time.  Recommended that she continue to try vestibular exercises at home and to follow-up with her PCP and ENT.  Overall stable for discharge at this time.   Regan Lemming, MD 07/16/21 1434

## 2021-07-15 NOTE — Discharge Instructions (Signed)
You were evaluated in the Emergency Department and after careful evaluation, we did not find any emergent condition requiring admission or further testing in the hospital.  Your exam/testing today was overall reassuring.  You had a positive Dix-Hallpike which is consistent with BPPV with some improvement in symptoms after an Epley maneuver.  I provided instructions with how to perform the Epley maneuver at home.  Regarding your episode of near syncope, your EKG was reassuring and your cardiac enzyme that was performed was normal you are having no chest pain at this time.  Low concern for an abnormal cardiac rhythm as the etiology of your near syncopal episode.  Suspect likely vasovagal syncope.  Recommend you follow-up routinely with your cardiologist and otolaryngologist regarding your symptoms.  Continue taking as needed meclizine at home as you currently have prescribed.  Please return to the Emergency Department if you experience any worsening of your condition.  Thank you for allowing [place] to be a part of your care.

## 2021-07-15 NOTE — ED Provider Notes (Signed)
[facility] EMERGENCY DEPARTMENT Provider Note   CSN: [id number] Arrival date & time: 07/15/21  1251     History  Chief Complaint  Patient presents with   Dizziness    Paula Pollard is a 88 y.o. female.  HPI  88 year old female with past medical history of HTN, HLD, DM, vertigo presents emergency department with a room spinning sensation.  Patient was at a hair salon, when she leaned back to get her hair washed she had sudden onset room spinning.  She states that this feels similar to her previous vertigo episodes.  She does take meclizine for her, no dosage today.  She denies any associated headache, neck pain, chest pain or shortness of breath.  There is no syncope.  No associated neuro symptoms.  She had mild nausea on arrival but this is resolved.  Home Medications Prior to Admission medications   Medication Sig Start Date End Date Taking? Authorizing Provider  albuterol (VENTOLIN HFA) 108 (90 Base) MCG/ACT inhaler Inhale 2 puffs into the lungs every 4 (four) hours as needed for wheezing or shortness of breath. 03/11/21   Valentina Shaggy, MD  Ascorbic Acid (VITAMIN C PO) Take 1 tablet by mouth daily.    [provider]  aspirin EC 81 MG tablet Take 81 mg by mouth daily.    [provider]  atorvastatin (LIPITOR) 40 MG tablet Take 40 mg by mouth daily. 03/16/19   [provider]  azelastine (ASTELIN) 0.1 % nasal spray Place 1 spray into both nostrils daily. Use in each nostril as directed 03/13/21   Valentina Shaggy, MD  Azelastine-Fluticasone [facility]) 137-50 MCG/ACT SUSP Place 1 spray into both nostrils 2 (two) times daily as needed. 03/12/20   Valentina Shaggy, MD  Calcium 200 MG TABS Take 200 mg by mouth daily.    [provider]  cholecalciferol (VITAMIN D) 400 units TABS tablet Take 1,200 Units by mouth daily.    [provider]  Cyanocobalamin (B-12 PO) Take 1 tablet by mouth as needed (for  energy).    [provider]  esomeprazole (NEXIUM) 20 MG capsule Take 20 mg by mouth as needed (for heartburn).    [provider]  fluticasone (FLONASE) 50 MCG/ACT nasal spray Place 1 spray into both nostrils daily. 03/13/21   Valentina Shaggy, MD  fluticasone-salmeterol (ADVAIR [facility]) [phone] MCG/ACT inhaler Inhale 2 puffs into the lungs 2 (two) times daily. 03/11/21   Valentina Shaggy, MD  hydrochlorothiazide (HYDRODIURIL) 12.5 MG tablet TAKE 1 TABLET BY MOUTH EVERY DAY 04/04/21   Glendale Chard, MD  LORazepam (ATIVAN) 0.5 MG tablet Take 1 tablet (0.5 mg total) by mouth every 12 (twelve) hours as needed for anxiety. 10/30/18   Aline August, MD  meclizine (ANTIVERT) 25 MG tablet  09/16/19   [provider]  Multiple Vitamins-Minerals (ECHINACEA ACZ PO) Take 1 capsule by mouth as needed (for immune support).    [provider]  nebivolol (BYSTOLIC) 5 MG tablet Take 1 tablet (5 mg total) by mouth daily. 08/25/12   Medina-Vargas, Monina C, NP  nitroGLYCERIN (NITROSTAT) 0.4 MG SL tablet  04/25/20   [provider]  Polyethyl Glycol-Propyl Glycol 0.4-0.3 % SOLN Apply 1 drop to eye 2 (two) times a day. systane    [provider]  telmisartan (MICARDIS) 20 MG tablet TAKE 1 TABLET BY MOUTH EVERY DAY 03/10/21   Glendale Chard, MD  triamcinolone cream (KENALOG) 0.1 % Apply 1 application topically 2 (two)  times daily as needed. 04/23/21   Glendale Chard, MD  vitamin E 400 UNIT capsule Take 400 Units by mouth daily.    [provider]  Calcium Carbonate-Vitamin D (CALCIUM-VITAMIN D3 PO) Take 1 tablet by mouth every morning. 1200mg  of calcium  08/25/12  [provider]      Allergies    Codeine and Doxycycline    Review of Systems   Review of Systems  Constitutional:  Negative for fever.  Eyes:  Negative for visual disturbance.  Respiratory:  Negative for shortness of breath.   Cardiovascular:  Negative for chest pain.   Gastrointestinal:  Negative for abdominal pain, diarrhea and vomiting.  Musculoskeletal:  Negative for back pain and neck pain.  Skin:  Negative for rash.  Neurological:  Positive for dizziness. Negative for weakness, numbness and headaches.   Physical Exam Updated Vital Signs BP (!) 149/76    Pulse 78    Temp 97.9 F (36.6 C) (Oral)    Resp 16    Ht 5\' 1"  (1.549 m)    Wt 77.1 kg    SpO2 100%    BMI 32.12 kg/m  Physical Exam Vitals and nursing note reviewed.  Constitutional:      General: She is not in acute distress.    Appearance: Normal appearance.  HENT:     Head: Normocephalic.     Mouth/Throat:     Mouth: Mucous membranes are moist.  Eyes:     Pupils: Pupils are equal, round, and reactive to light.     Comments: Horizontal nystagmus  Cardiovascular:     Rate and Rhythm: Normal rate.  Pulmonary:     Effort: Pulmonary effort is normal. No respiratory distress.  Abdominal:     Palpations: Abdomen is soft.     Tenderness: There is no abdominal tenderness.  Skin:    General: Skin is warm.  Neurological:     General: No focal deficit present.     Mental Status: She is alert and oriented to person, place, and time. Mental status is at baseline.     Cranial Nerves: No cranial nerve deficit.  Psychiatric:        Mood and Affect: Mood normal.    ED Results / Procedures / Treatments   Labs (all labs ordered are listed, but only abnormal results are displayed) Labs Reviewed  CBC WITH DIFFERENTIAL/PLATELET  URINALYSIS, ROUTINE W REFLEX MICROSCOPIC  I-STAT CHEM 8, ED    EKG EKG Interpretation  Date/Time:  Tuesday July 15 2021 13:03:20 EST Ventricular Rate:  83 PR Interval:  215 QRS Duration: 161 QT Interval:  429 QTC Calculation: 505 R Axis:   -7 Text Interpretation: Sinus rhythm Borderline prolonged PR interval Left bundle branch block LBBB old Confirmed by Lavenia Atlas [phone]) on 07/15/2021 2:59:47 PM  Radiology No results found.  Procedures Procedures     Medications Ordered in ED Medications  meclizine (ANTIVERT) tablet 25 mg (has no administration in time range)    ED Course/ Medical Decision Making/ A&P                           Medical Decision Making Amount and/or Complexity of Data Reviewed Labs: ordered.   88 year old female presents emergency department with room spinning sensation, sounds very vertiginous and origin.  History of vertigo and the symptoms are similar to her previous episodes.  Physical exam shows horizontal nystagmus but is otherwise very reassuring from a neuro standpoint.  No  chest pain or cardiac symptoms.  EKG is unchanged for the patient.  Vitals are stable.  We will perform orthostatic vitals, treat her symptomatically and reevaluate.  Low suspicion for ACS/PE.  Patient signed out pending reevaluation.        Final Clinical Impression(s) / ED Diagnoses Final diagnoses:  None    Rx / DC Orders ED Discharge Orders     None         Lorelle Gibbs, DO 07/15/21 1505

## 2021-07-15 NOTE — ED Notes (Addendum)
Pt got up to bedside commode with 1-assist. When asked pt states she is still experiencing dizzyness when moving head even when lying in bed. EDP notified

## 2021-07-15 NOTE — ED Notes (Signed)
Pt complained of a lot of dizziness and right ear hurting when sitting on the side of the bed and standing.

## 2021-07-15 NOTE — ED Triage Notes (Addendum)
Pt arrived via [facility] with c/c of Dizziness and palpations. Per EMS pts had sudden onset of palpations when getting off toilet. Pt become diaphoretic, 12 lead show LVH with hx. Hx of HTN. + Orthostatics with increased dizziness. Pt states she has been having some weakness for a few days.   180/100 -->158/89, 95-100HR 231ml NSS

## 2021-07-16 ENCOUNTER — Telehealth: Payer: Self-pay

## 2021-07-16 NOTE — Telephone Encounter (Signed)
Transition Care Management Follow-up Telephone Call Date of discharge and from where: 07/15/2021. [facility] How have you been since you were released from the hospital? Pt still complains of dizziness, decreased appetite.  Any questions or concerns? No  Items Reviewed: Did the pt receive and understand the discharge instructions provided? Yes  Medications obtained and verified? Yes  Other? Yes  Any new allergies since your discharge? No  Dietary orders reviewed? Yes Do you have support at home? Yes   Home Care and Equipment/Supplies: Were home health services ordered? not applicable If so, what is the name of the agency? N/a  Has the agency set up a time to come to the patient's home? not applicable Were any new equipment or medical supplies ordered?  No What is the name of the medical supply agency? N/a Were you able to get the supplies/equipment? not applicable Do you have any questions related to the use of the equipment or supplies? No  Functional Questionnaire: (I = Independent and D = Dependent) ADLs: i  Bathing/Dressing- i  Meal Prep- i  Eating- i  Maintaining continence- i  Transferring/Ambulation- i  Managing Meds- i  Follow up appointments reviewed:  PCP Hospital f/u appt confirmed? No  Scheduled to see n/a on n/a @ n/a. [facility] f/u appt confirmed? Yes  Scheduled to see cardiologist  on n/a @ n/a. Are transportation arrangements needed? No  If their condition worsens, is the pt aware to call PCP or go to the Emergency Dept.? Yes Was the patient provided with contact information for the PCP's office or ED? Yes Was to pt encouraged to call back with questions or concerns? Yes

## 2021-07-17 ENCOUNTER — Telehealth: Payer: Self-pay

## 2021-07-17 NOTE — Telephone Encounter (Signed)
Called pt to make ED f/u appointment. Pt sates she does not feel the need for an apt at this moment. If she has a huge issue she will give the office a call back to make apt, for now she is okay. Pt aware, pt declined.

## 2021-07-29 ENCOUNTER — Other Ambulatory Visit: Payer: Self-pay | Admitting: Allergy & Immunology

## 2021-07-31 DIAGNOSIS — H838X2 Other specified diseases of left inner ear: Secondary | ICD-10-CM | POA: Diagnosis not present

## 2021-07-31 DIAGNOSIS — R42 Dizziness and giddiness: Secondary | ICD-10-CM | POA: Diagnosis not present

## 2021-07-31 DIAGNOSIS — H9042 Sensorineural hearing loss, unilateral, left ear, with unrestricted hearing on the contralateral side: Secondary | ICD-10-CM | POA: Diagnosis not present

## 2021-07-31 DIAGNOSIS — H8111 Benign paroxysmal vertigo, right ear: Secondary | ICD-10-CM | POA: Diagnosis not present

## 2021-08-01 ENCOUNTER — Other Ambulatory Visit: Payer: Self-pay | Admitting: Otolaryngology

## 2021-08-01 DIAGNOSIS — H903 Sensorineural hearing loss, bilateral: Secondary | ICD-10-CM

## 2021-08-19 DIAGNOSIS — I5030 Unspecified diastolic (congestive) heart failure: Secondary | ICD-10-CM | POA: Diagnosis not present

## 2021-08-19 DIAGNOSIS — E785 Hyperlipidemia, unspecified: Secondary | ICD-10-CM | POA: Diagnosis not present

## 2021-08-19 DIAGNOSIS — I2510 Atherosclerotic heart disease of native coronary artery without angina pectoris: Secondary | ICD-10-CM | POA: Diagnosis not present

## 2021-08-19 DIAGNOSIS — I10 Essential (primary) hypertension: Secondary | ICD-10-CM | POA: Diagnosis not present

## 2021-08-19 DIAGNOSIS — I38 Endocarditis, valve unspecified: Secondary | ICD-10-CM | POA: Diagnosis not present

## 2021-08-28 ENCOUNTER — Telehealth: Payer: Self-pay | Admitting: Allergy & Immunology

## 2021-08-28 NOTE — Telephone Encounter (Signed)
PATIENT CALLED STATING THAT [facility] SENT HER TWO LETTERS STATING THAT SHE CAN NO LONGER RECEIVE HER ALBUTEROL INHALER UNTIL THEY RECEIVE CONFIRMATION FROM HER DOCTOR THAT THIS MEDICATION IS NEEDED. HER PHARMACY IS [facility] [address]. [phone] ?

## 2021-08-28 NOTE — Telephone Encounter (Signed)
I called the patient after I spoke to the pharmacy. They were able to fix the issue, but she will need to keep her appointment for her insurance to keep covering her albuterol inhaler. Her next appointment is on 10/09/21 at 10:15 with Dr. Ernst Bowler. I left a detailed message for her to call back with any questions or concerns.  ?

## 2021-08-29 NOTE — Telephone Encounter (Signed)
So sorry for the all caps. ?

## 2021-08-29 NOTE — Telephone Encounter (Signed)
Great - thank you for taking care of that.  ? ?Salvatore Marvel, MD ?Allergy and [place] [facility] ? ?

## 2021-09-01 DIAGNOSIS — R42 Dizziness and giddiness: Secondary | ICD-10-CM | POA: Diagnosis not present

## 2021-09-01 DIAGNOSIS — H9042 Sensorineural hearing loss, unilateral, left ear, with unrestricted hearing on the contralateral side: Secondary | ICD-10-CM | POA: Diagnosis not present

## 2021-09-01 DIAGNOSIS — H8111 Benign paroxysmal vertigo, right ear: Secondary | ICD-10-CM | POA: Diagnosis not present

## 2021-09-09 ENCOUNTER — Ambulatory Visit: Payer: Medicare Other | Admitting: Allergy & Immunology

## 2021-09-22 ENCOUNTER — Telehealth: Payer: Self-pay | Admitting: Allergy & Immunology

## 2021-09-22 NOTE — Telephone Encounter (Signed)
Paula Pollard called in and states  ?

## 2021-09-22 NOTE — Telephone Encounter (Signed)
Cuca called in and states that her albuterol inhaler doesn't work.  Lizzie states nothing comes out and it doesn't last long.  I asked Mackie was she talking about the Ventolin in a blue or gray inhaler and she said no that it was a red inhaler.  She states that she would like to try another one and states that she doesn't like this one.  Please advise.  ?

## 2021-09-23 ENCOUNTER — Ambulatory Visit
Admission: RE | Admit: 2021-09-23 | Discharge: 2021-09-23 | Disposition: A | Discharge status: Home / Self Care | Payer: Medicare Other | Source: Ambulatory Visit | Attending: Otolaryngology | Admitting: Otolaryngology

## 2021-09-23 ENCOUNTER — Other Ambulatory Visit: Payer: Self-pay | Admitting: *Deleted

## 2021-09-23 DIAGNOSIS — R531 Weakness: Secondary | ICD-10-CM | POA: Diagnosis not present

## 2021-09-23 DIAGNOSIS — R200 Anesthesia of skin: Secondary | ICD-10-CM | POA: Diagnosis not present

## 2021-09-23 DIAGNOSIS — H9192 Unspecified hearing loss, left ear: Secondary | ICD-10-CM | POA: Diagnosis not present

## 2021-09-23 DIAGNOSIS — G9389 Other specified disorders of brain: Secondary | ICD-10-CM | POA: Diagnosis not present

## 2021-09-23 DIAGNOSIS — H903 Sensorineural hearing loss, bilateral: Secondary | ICD-10-CM

## 2021-09-23 MED ORDER — GADOBENATE DIMEGLUMINE 529 MG/ML IV SOLN
16.0000 mL | Freq: Once | INTRAVENOUS | Status: AC | PRN
  Administered 2021-09-23: 16 mL via INTRAVENOUS

## 2021-09-23 MED ORDER — VENTOLIN HFA 108 (90 BASE) MCG/ACT IN AERS: 2.0000 | INHALATION_SPRAY | Freq: Four times a day (QID) | RESPIRATORY_TRACT | 1 refills | Status: DC | PRN

## 2021-09-23 NOTE — Telephone Encounter (Signed)
Brand Ventolin has been sent to patient's pharmacy. Called patient and advised of change, patient verbalized understanding.  ?

## 2021-09-23 NOTE — Telephone Encounter (Signed)
We can send in a different one. ? ?Salvatore Marvel, MD ?Allergy and [place] [facility] ? ?

## 2021-09-30 ENCOUNTER — Other Ambulatory Visit: Payer: Self-pay | Admitting: Internal Medicine

## 2021-10-09 ENCOUNTER — Other Ambulatory Visit: Payer: Self-pay

## 2021-10-09 ENCOUNTER — Encounter: Payer: Self-pay | Admitting: Allergy & Immunology

## 2021-10-09 ENCOUNTER — Ambulatory Visit (INDEPENDENT_AMBULATORY_CARE_PROVIDER_SITE_OTHER): Payer: Medicare Other | Admitting: Allergy & Immunology

## 2021-10-09 ENCOUNTER — Telehealth: Payer: Self-pay | Admitting: Allergy & Immunology

## 2021-10-09 VITALS — BP 142/80 | HR 95 | Temp 97.5°F

## 2021-10-09 DIAGNOSIS — R42 Dizziness and giddiness: Secondary | ICD-10-CM | POA: Diagnosis not present

## 2021-10-09 DIAGNOSIS — J3089 Other allergic rhinitis: Secondary | ICD-10-CM

## 2021-10-09 DIAGNOSIS — J4540 Moderate persistent asthma, uncomplicated: Secondary | ICD-10-CM | POA: Diagnosis not present

## 2021-10-09 MED ORDER — FLUTICASONE PROPIONATE 50 MCG/ACT NA SUSP: 1.0000 | Freq: Every day | NASAL | 5 refills | Status: DC

## 2021-10-09 MED ORDER — VENTOLIN HFA 108 (90 BASE) MCG/ACT IN AERS: 2.0000 | INHALATION_SPRAY | Freq: Four times a day (QID) | RESPIRATORY_TRACT | 1 refills | Status: DC | PRN

## 2021-10-09 MED ORDER — FLUTICASONE-SALMETEROL 115-21 MCG/ACT IN AERO: 2.0000 | INHALATION_SPRAY | Freq: Two times a day (BID) | RESPIRATORY_TRACT | 5 refills | Status: DC

## 2021-10-09 MED ORDER — FLUTICASONE FUROATE-VILANTEROL 100-25 MCG/ACT IN AEPB: 1.0000 | INHALATION_SPRAY | Freq: Every day | RESPIRATORY_TRACT | 5 refills | Status: DC

## 2021-10-09 MED ORDER — AZELASTINE HCL 0.1 % NA SOLN: 1.0000 | Freq: Every day | NASAL | 5 refills | Status: DC

## 2021-10-09 NOTE — Progress Notes (Signed)
? ?FOLLOW UP ? ?Date of Service/Encounter:  10/09/21 ? ? ?Assessment:  ? ?No diagnosis found. ? ?Plan/Recommendations:  ? ? ?There are no Patient Instructions on file for this visit. ? ? ?Subjective:  ? ?Paula Pollard is a 88 y.o. female presenting today for follow up of  ?Chief Complaint  ?Patient presents with  ? Asthma  ?  Issues breathing, feeling tired, feeling phlegm in throat  ? ? ?Paula Pollard has a history of the following: ?Patient Active Problem List  ? Diagnosis Date Noted  ? Hypertensive nephropathy 04/23/2021  ? Chronic renal disease, stage II 04/23/2021  ? Hair loss 04/23/2021  ? Other abnormal glucose 04/23/2021  ? Mild intermittent asthma 06/26/2019  ? Vertigo 10/28/2018  ? Hypokalemia 10/28/2018  ? Acute sinusitis 10/05/2017  ? Other allergic rhinitis 10/05/2017  ? Asthma with acute exacerbation 03/06/2015  ? Unexplained night sweats 03/06/2015  ? Cough 02/16/2015  ? Essential hypertension, benign 08/25/2012  ? Other and unspecified hyperlipidemia 08/25/2012  ? Benign paroxysmal positional vertigo 08/25/2012  ? Unspecified constipation 08/25/2012  ? Generalized anxiety disorder 08/25/2012  ? GERD (gastroesophageal reflux disease) 08/25/2012  ? Osteoarthritis of right hip 08/10/2012  ? ? ?History obtained from: chart review and patient. ? ?Paula Pollard is a 88 y.o. female presenting for a follow up visit.  She was last seen in November 2022.  At that time, lung testing looks stable.  We made no changes.  We continue with Advair 115 mcg 2 puffs 1-2 times daily as well as albuterol as needed.  During periods of respiratory distress, she increases her Advair to 2 puffs twice daily for 1 to 2 weeks.  Allergic rhinitis was controlled with Flonase as well as Astelin and Pataday.  She continues to follow with Dr. Benjamine Mola for management of her vertigo. ? ?Since last visit, she has done well.  ? ?Asthma/Respiratory Symptom History: She actually tried [employer] and this was "too much for [her] heart."   It seems that she has bene using Ventolin BID. She has Advair but she not been using it at all. She was very confused with what medications are daily controllers versus PRN medications. Regardless, she has done well. Paula Pollard's asthma has been well controlled. She has not required rescue medication, experienced nocturnal awakenings due to lower respiratory symptoms, nor have activities of daily living been limited. She has required no Emergency Department or Urgent Care visits for her asthma. She has required zero courses of systemic steroids for asthma exacerbations since the last visit. ACT score today is 25, indicating excellent asthma symptom control.  ? ?Allergic Rhinitis Symptom History: Rhinitis is under good control with the use of fluticasone as well as Azelastine and Pataday. She has to purchase Pataday OTC now.  She has not had any antibiotics and overall her control is excellent.  She is very happy with how well she is doing. ? ?Dizziness has been a problem. She has been losing hearing in her right ear.  However, it is not low enough to consider the use of a hearing aids.  Overall, she is very pleased with how well she is doing. ? ?She does have a history of left ventricular hypertrophy and diastolic dysfunction. She has continued to have the pain but she keeps moving. She is on no medications regularly. She has something to take for emergencies. ? ?Her daughter came down from [place] for five days. Ms. Paula Pollard lived in [place] for 40 years and then moved back here. She  has a son in [place] and another son that lives with her. She has another daughter who lives locally.  ? ?Apparently she is now taking care of her ex-husband.  This ex-husband is 31 and left her with 4 kids when her oldest was just in high school.  Apparently he has no one else to take care of him.  I told her she was at [facility] for doing this. ? ?Otherwise, there have been no changes to her past medical history, surgical history, family  history, or social history. ? ? ? ?Review of Systems  ?Constitutional: Negative.  Negative for chills, fever, malaise/fatigue and weight loss.  ?HENT:  Negative for congestion, ear discharge, ear pain and sinus pain.   ?Eyes:  Negative for pain, discharge and redness.  ?Respiratory:  Negative for cough, sputum production, shortness of breath and wheezing.   ?Cardiovascular: Negative.  Negative for chest pain and palpitations.  ?Gastrointestinal:  Negative for abdominal pain, constipation, diarrhea, heartburn, nausea and vomiting.  ?Skin: Negative.  Negative for itching and rash.  ?Neurological:  Negative for dizziness and headaches.  ?Endo/Heme/Allergies:  Negative for environmental allergies. Does not bruise/bleed easily.   ? ? ? ?Objective:  ? ?Blood pressure (!) 142/80, pulse 95, temperature (!) 97.5 ?F (36.4 ?C), SpO2 97 %. ?There is no height or weight on file to calculate BMI. ? ? ? ?Physical Exam ?Vitals reviewed.  ?Constitutional:   ?   Appearance: She is well-developed.  ?   Comments: Delightful female.  Very interactive.  ?HENT:  ?   Head: Normocephalic and atraumatic.  ?   Right Ear: Tympanic membrane, ear canal and external ear normal.  ?   Left Ear: Tympanic membrane, ear canal and external ear normal.  ?   Nose: No nasal deformity, septal deviation, mucosal edema or rhinorrhea.  ?   Right Turbinates: Enlarged, swollen and pale.  ?   Left Turbinates: Enlarged, swollen and pale.  ?   Right Sinus: No maxillary sinus tenderness or frontal sinus tenderness.  ?   Left Sinus: No maxillary sinus tenderness or frontal sinus tenderness.  ?   Mouth/Throat:  ?   Mouth: Mucous membranes are not pale and not dry.  ?   Pharynx: Uvula midline.  ?   Comments: Cobblestoning in the posterior oropharynx.  ?Eyes:  ?   General: Lids are normal. No allergic shiner.    ?   Right eye: No discharge.     ?   Left eye: No discharge.  ?   Conjunctiva/sclera: Conjunctivae normal.  ?   Right eye: Right conjunctiva is not injected.  No chemosis. ?   Left eye: Left conjunctiva is not injected. No chemosis. ?   Pupils: Pupils are equal, round, and reactive to light.  ?Cardiovascular:  ?   Rate and Rhythm: Normal rate and regular rhythm.  ?   Heart sounds: Normal heart sounds.  ?Pulmonary:  ?   Effort: Pulmonary effort is normal. No tachypnea, accessory muscle usage or respiratory distress.  ?   Breath sounds: Normal breath sounds. No wheezing, rhonchi or rales.  ?   Comments: Moving air well in all lung fields.  No wheezes noted. ?Chest:  ?   Chest wall: No tenderness.  ?Lymphadenopathy:  ?   Cervical: No cervical adenopathy.  ?Skin: ?   Coloration: Skin is not pale.  ?   Findings: No abrasion, erythema, petechiae or rash. Rash is not papular, urticarial or vesicular.  ?Neurological:  ?   Mental  Status: She is alert.  ?Psychiatric:     ?   Behavior: Behavior is cooperative.  ?  ? ?Diagnostic studies:   ? ?Spirometry: results normal (FEV1: 1.05/79%, FVC: 1.34/76%, FEV1/FVC: 78%).  ?  ?Spirometry consistent with normal pattern.  ? ? ?Allergy Studies: none ? ? ? ? ? ?  ?Salvatore Marvel, MD  ?[facility] ? ? ? ? ? ? ?

## 2021-10-09 NOTE — Telephone Encounter (Signed)
Called and spoke to patient and informed her of the note per Dr. Ernst Bowler. Patient stated that she would try the [facility]. I have sent in the prescription to the pharmacy on file.  ?

## 2021-10-09 NOTE — Patient Instructions (Addendum)
1. Mild intermittent asthma without complication ?- Lung testing looked stable today. ?- I think we are all mixed up with your medications. ?- Daily controller medication(s): Advair 115/9mg two puffs 1-2 times daily ? ? ?- Prior to physical activity: Ventolin 2 puffs 10-15 minutes before physical activity. ?- Rescue medications: Ventolin 4 puffs every 4-6 hours as needed ?- Changes during respiratory infections or worsening symptoms: Increase Advair 2 puffs twice daily for ONE TO TWO WEEKS. ?- Asthma control goals:  ?* Full participation in all desired activities (may need albuterol before activity) ?* Albuterol use two time or less a week on average (not counting use with activity) ?* Cough interfering with sleep two time or less a month ?* Oral steroids no more than once a year ?* No hospitalizations ? ?2. Allergic rhinitis ?- Continue with Dymista 1-2 sprays per nostril twice daily.  ?- Continue with Pataday one drop per eye twice daily.  ? ?3. Vertigo ?- Continue to follow with Dr. TBenjamine Molaas needed.  ? ?4. Return in about 6 months (around 04/11/2022).  ? ? ?Please inform [place] any Emergency Department visits, hospitalizations, or changes in symptoms. Call [place] going to the ED for breathing or allergy symptoms since we might be able to fit you in for a sick visit. Feel free to contact u[place]anytime with any questions, problems, or concerns. ? ?It was a pleasure to see you again today! ? ?Websites that have reliable patient information: ?1. American Academy of Asthma, Allergy, and Immunology: www.aaaai.org ?2. Food Allergy Research and Education (FARE): foodallergy.org ?3. Mothers of Asthmatics: http://www.asthmacommunitynetwork.org ?4. American College of Allergy, Asthma, and Immunology: wMonthlyElectricBill.co.uk? ? ? ?COVID-19 Vaccine Information can be found at: [email or web address] questions related to vaccine distribution or appointments, please email  [email or web address] or call [phone]  ? ?We realize that you might be concerned about having an allergic reaction to the COVID19 vaccines. To help with that concern, WE ARE OFFERING THE COVID19 VACCINES IN OUR OFFICE! Ask the front desk for dates!  ? ? ? ??Like? [facility] Facebook and Instagram for our latest updates!  ?  ? ? ?A healthy democracy works best when ANew [place] Life Insuranceparticipate! Make sure you are registered to vote! If you have moved or changed any of your contact information, you will need to get this updated before voting! ? ?In some cases, you MAY be able to register to vote online: [email or web address]? ? ? ? ? ? ? ? ? ? ?

## 2021-10-09 NOTE — Telephone Encounter (Signed)
Patient stating she went to pick up her prescription for Advair it was too expensive so she did not fill this prescription asking for an alternative that is cheaper for her (fluticasone-salmeterol (ADVAIR HFA) 115-21 MCG/ACT inhaler [[id number] ) please advise  ?

## 2021-10-09 NOTE — Telephone Encounter (Signed)
Alternatives include Symbicort 160 mcg 2 puffs twice daily, Dulera 200 mcg 2 puffs twice daily, Breo 100 mcg 1 puff once daily, or Advair 250/50 mcg one puff twice daily.  ? ?Salvatore Marvel, MD ?[facility] ? ?

## 2021-10-27 ENCOUNTER — Encounter: Payer: Self-pay | Admitting: Internal Medicine

## 2021-10-27 ENCOUNTER — Ambulatory Visit (INDEPENDENT_AMBULATORY_CARE_PROVIDER_SITE_OTHER): Payer: Medicare Other | Admitting: Internal Medicine

## 2021-10-27 VITALS — BP 130/80 | HR 77 | Temp 98.3°F | Ht 62.4 in | Wt 174.8 lb

## 2021-10-27 DIAGNOSIS — Z23 Encounter for immunization: Secondary | ICD-10-CM | POA: Diagnosis not present

## 2021-10-27 DIAGNOSIS — Z79899 Other long term (current) drug therapy: Secondary | ICD-10-CM | POA: Diagnosis not present

## 2021-10-27 DIAGNOSIS — E6609 Other obesity due to excess calories: Secondary | ICD-10-CM

## 2021-10-27 DIAGNOSIS — N182 Chronic kidney disease, stage 2 (mild): Secondary | ICD-10-CM | POA: Diagnosis not present

## 2021-10-27 DIAGNOSIS — J4520 Mild intermittent asthma, uncomplicated: Secondary | ICD-10-CM

## 2021-10-27 DIAGNOSIS — I129 Hypertensive chronic kidney disease with stage 1 through stage 4 chronic kidney disease, or unspecified chronic kidney disease: Secondary | ICD-10-CM

## 2021-10-27 DIAGNOSIS — Z6831 Body mass index (BMI) 31.0-31.9, adult: Secondary | ICD-10-CM | POA: Diagnosis not present

## 2021-10-27 DIAGNOSIS — L2481 Irritant contact dermatitis due to metals: Secondary | ICD-10-CM

## 2021-10-27 DIAGNOSIS — R7309 Other abnormal glucose: Secondary | ICD-10-CM | POA: Diagnosis not present

## 2021-10-27 MED ORDER — TRIAMCINOLONE ACETONIDE 0.1 % EX CREA: TOPICAL_CREAM | CUTANEOUS | 0 refills | Status: DC

## 2021-10-27 NOTE — Patient Instructions (Signed)
Hypertension, Adult ?Hypertension is another name for high blood pressure. High blood pressure forces your heart to work harder to pump blood. This can cause problems over time. ?There are two numbers in a blood pressure reading. There is a top number (systolic) over a bottom number (diastolic). It is best to have a blood pressure that is below 120/80. ?What are the causes? ?The cause of this condition is not known. Some other conditions can lead to high blood pressure. ?What increases the risk? ?Some lifestyle factors can make you more likely to develop high blood pressure: ?Smoking. ?Not getting enough exercise or physical activity. ?Being overweight. ?Having too much fat, sugar, calories, or salt (sodium) in your diet. ?Drinking too much alcohol. ?Other risk factors include: ?Having any of these conditions: ?Heart disease. ?Diabetes. ?High cholesterol. ?Kidney disease. ?Obstructive sleep apnea. ?Having a family history of high blood pressure and high cholesterol. ?Age. The risk increases with age. ?Stress. ?What are the signs or symptoms? ?High blood pressure may not cause symptoms. Very high blood pressure (hypertensive crisis) may cause: ?Headache. ?Fast or uneven heartbeats (palpitations). ?Shortness of breath. ?Nosebleed. ?Vomiting or feeling like you may vomit (nauseous). ?Changes in how you see. ?Very bad chest pain. ?Feeling dizzy. ?Seizures. ?How is this treated? ?This condition is treated by making healthy lifestyle changes, such as: ?Eating healthy foods. ?Exercising more. ?Drinking less alcohol. ?Your doctor may prescribe medicine if lifestyle changes do not help enough and if: ?Your top number is above 130. ?Your bottom number is above 80. ?Your personal target blood pressure may vary. ?Follow these instructions at home: ?Eating and drinking ? ?If told, follow the DASH eating plan. To follow this plan: ?Fill one half of your plate at each meal with fruits and vegetables. ?Fill one fourth of your plate  at each meal with whole grains. Whole grains include whole-wheat pasta, brown rice, and whole-grain bread. ?Eat or drink low-fat dairy products, such as skim milk or low-fat yogurt. ?Fill one fourth of your plate at each meal with low-fat (lean) proteins. Low-fat proteins include fish, chicken without skin, eggs, beans, and tofu. ?Avoid fatty meat, cured and processed meat, or chicken with skin. ?Avoid pre-made or processed food. ?Limit the amount of salt in your diet to less than 1,500 mg each day. ?Do not drink alcohol if: ?Your doctor tells you not to drink. ?You are pregnant, may be pregnant, or are planning to become pregnant. ?If you drink alcohol: ?Limit how much you have to: ?0-1 drink a day for women. ?0-2 drinks a day for men. ?Know how much alcohol is in your drink. In the [place] one drink equals one 12 oz bottle of beer (355 mL), one 5 oz glass of wine (148 mL), or one 1? oz glass of hard liquor (44 mL). ?Lifestyle ? ?Work with your doctor to stay at a healthy weight or to lose weight. Ask your doctor what the best weight is for you. ?Get at least 30 minutes of exercise that causes your heart to beat faster (aerobic exercise) most days of the week. This may include walking, swimming, or biking. ?Get at least 30 minutes of exercise that strengthens your muscles (resistance exercise) at least 3 days a week. This may include lifting weights or doing Pilates. ?Do not smoke or use any products that contain nicotine or tobacco. If you need help quitting, ask your doctor. ?Check your blood pressure at home as told by your doctor. ?Keep all follow-up visits. ?Medicines ?Take over-the-counter and prescription medicines   only as told by your doctor. Follow directions carefully. ?Do not skip doses of blood pressure medicine. The medicine does not work as well if you skip doses. Skipping doses also puts you at risk for problems. ?Ask your doctor about side effects or reactions to medicines that you should watch  for. ?Contact a doctor if: ?You think you are having a reaction to the medicine you are taking. ?You have headaches that keep coming back. ?You feel dizzy. ?You have swelling in your ankles. ?You have trouble with your vision. ?Get help right away if: ?You get a very bad headache. ?You start to feel mixed up (confused). ?You feel weak or numb. ?You feel faint. ?You have very bad pain in your: ?Chest. ?Belly (abdomen). ?You vomit more than once. ?You have trouble breathing. ?These symptoms may be an emergency. Get help right away. Call 911. ?Do not wait to see if the symptoms will go away. ?Do not drive yourself to the hospital. ?Summary ?Hypertension is another name for high blood pressure. ?High blood pressure forces your heart to work harder to pump blood. ?For most people, a normal blood pressure is less than 120/80. ?Making healthy choices can help lower blood pressure. If your blood pressure does not get lower with healthy choices, you may need to take medicine. ?This information is not intended to replace advice given to you by your health care provider. Make sure you discuss any questions you have with your health care provider. ?Document Revised: 03/13/2021 Document Reviewed: 03/13/2021 ?Elsevier Patient Education ? 2023 [employer] ? ?

## 2021-10-27 NOTE — Progress Notes (Signed)
xx I,Jameka J Llittleton,acting as a Education administrator for [name], MD.,have documented all relevant documentation on the behalf of [name], MD,as directed by  [name], MD while in the presence of [name], MD.  This visit occurred during the SARS-CoV-2 public health emergency.  Safety protocols were in place, including screening questions prior to the visit, additional usage of staff PPE, and extensive cleaning of exam room while observing appropriate contact time as indicated for disinfecting solutions.  Subjective:     Patient ID: Paula Pollard , female    DOB: 1929-07-25 , 88 y.o.   MRN: [id number]   Chief Complaint  Patient presents with   Hypertension    HPI  She is here today for BP check. She reports compliance with meds. She denies headaches, chest pain and shortness of breath. Patient reports she has broken out in a rash around her neck.   Hypertension This is a chronic problem. The current episode started more than 1 year ago. The problem has been gradually improving since onset. The problem is controlled. Pertinent negatives include no blurred vision, chest pain, palpitations or shortness of breath. Risk factors for coronary artery disease include sedentary lifestyle, obesity, stress and post-menopausal state. There are no compliance problems.  Hypertensive end-organ damage includes kidney disease.    Past Medical History:  Diagnosis Date   Anemia    "in past as a young girl"   Anxiety    Arthritis    Asthma    Bronchitis    hx of   Cataract    bilaterally, "no surgery at this time"   Diabetes mellitus without complication ([place])    pre diabetic   Dysrhythmia    when gets excited   GERD (gastroesophageal reflux disease)    Headache(784.0)    hx of migraines   History of hiatal hernia    Hyperlipidemia    Hypertension    sees Dr. Terrence Dupont   Neuromuscular disorder [facility])    hx of carpal tunnel "never had surgery for"right hand     Family  History  Problem Relation Age of Onset   Heart attack Mother    Heart attack Father    Stroke Father    Stroke Daughter    Colon cancer Sister      Current Outpatient Medications:    acetaminophen (TYLENOL) 500 MG tablet, Take 500 mg by mouth every 6 (six) hours as needed for moderate pain or headache., Disp: , Rfl:    albuterol (VENTOLIN HFA) 108 (90 Base) MCG/ACT inhaler, INHALE 2 PUFFS INTO THE LUNGS EVERY 4 HOURS AS NEEDED FOR WHEEZING OR SHORTNESS OF BREATH., Disp: 18 each, Rfl: 1   Ascorbic Acid (VITAMIN C PO), Take 1 tablet by mouth daily., Disp: , Rfl:    aspirin EC 81 MG tablet, Take 81 mg by mouth daily., Disp: , Rfl:    atorvastatin (LIPITOR) 40 MG tablet, Take 40 mg by mouth daily., Disp: , Rfl:    azelastine (ASTELIN) 0.1 % nasal spray, Place 1 spray into both nostrils daily. Use in each nostril as directed, Disp: 30 mL, Rfl: 5   cholecalciferol (VITAMIN D) 400 units TABS tablet, Take 600 Units by mouth daily., Disp: , Rfl:    Cyanocobalamin (B-12 PO), Take 500 mcg by mouth daily., Disp: , Rfl:    esomeprazole (NEXIUM) 20 MG capsule, Take 20 mg by mouth as needed (for heartburn)., Disp: , Rfl:    fluticasone (FLONASE) 50 MCG/ACT nasal spray,  Place 1 spray into both nostrils daily., Disp: 16 mL, Rfl: 5   fluticasone furoate-vilanterol (BREO ELLIPTA) 100-25 MCG/ACT AEPB, Inhale 1 puff into the lungs daily., Disp: 60 each, Rfl: 5   fluticasone-salmeterol (ADVAIR HFA) 115-21 MCG/ACT inhaler, Inhale 2 puffs into the lungs 2 (two) times daily., Disp: 1 each, Rfl: 5   hydrochlorothiazide (HYDRODIURIL) 12.5 MG tablet, Take 1 tablet (12.5 mg total) by mouth daily., Disp: 90 tablet, Rfl: 1   meclizine (ANTIVERT) 25 MG tablet, Take 12.5 mg by mouth 3 (three) times daily as needed for dizziness., Disp: , Rfl:    metoprolol succinate (TOPROL-XL) 50 MG 24 hr tablet, Take 50 mg by mouth daily., Disp: , Rfl:    Multiple Vitamins-Minerals (ECHINACEA ACZ PO), Take 1 capsule by mouth as needed  (for immune support)., Disp: , Rfl:    nebivolol (BYSTOLIC) 5 MG tablet, Take 1 tablet (5 mg total) by mouth daily., Disp: 30 tablet, Rfl: 0   nitroGLYCERIN (NITROSTAT) 0.4 MG SL tablet, Place 0.4 mg under the tongue every 5 (five) minutes as needed for chest pain., Disp: , Rfl:    Polyethyl Glycol-Propyl Glycol 0.4-0.3 % SOLN, Apply 1 drop to eye 2 (two) times a day. systane, Disp: , Rfl:    telmisartan (MICARDIS) 20 MG tablet, Take 1 tablet (20 mg total) by mouth daily., Disp: 90 tablet, Rfl: 1   triamcinolone cream (KENALOG) 0.1 %, APPLY TO rash on neck TWICE DAILY AS NEEDED, Disp: 15 g, Rfl: 0   VENTOLIN HFA 108 (90 Base) MCG/ACT inhaler, Inhale 2 puffs into the lungs every 6 (six) hours as needed for wheezing or shortness of breath., Disp: 18 g, Rfl: 1   vitamin E 400 UNIT capsule, Take 400 Units by mouth daily., Disp: , Rfl:    Allergies  Allergen Reactions   Codeine Nausea Only and Other (See Comments)    Reaction:Dizziness and hallucinations "makes me climb walls"   Doxycycline Other (See Comments)     Review of Systems  Constitutional: Negative.   Eyes:  Negative for blurred vision.  Respiratory: Negative.  Negative for shortness of breath.   Cardiovascular: Negative.  Negative for chest pain and palpitations.  Gastrointestinal: Negative.   Skin:  Positive for rash.       She c/o rash on the right side of her neck. Itchy. Not sure what is causing the rash  Neurological: Negative.   Psychiatric/Behavioral: Negative.      Today's Vitals   10/27/21 1105  BP: 130/80  Pulse: 77  Temp: 98.3 F (36.8 C)  Weight: 174 lb 12.8 oz (79.3 kg)  Height: 5' 2.4" (1.585 m)  PainSc: 0-No pain   Body mass index is 31.56 kg/m.  Wt Readings from Last 3 Encounters:  10/27/21 174 lb 12.8 oz (79.3 kg)  07/15/21 170 lb (77.1 kg)  04/23/21 173 lb 1 oz (78.5 kg)    Objective:  Physical Exam Vitals and nursing note reviewed.  Constitutional:      Appearance: Normal appearance.  HENT:      Head: Normocephalic and atraumatic.  Cardiovascular:     Rate and Rhythm: Normal rate and regular rhythm.     Heart sounds: Normal heart sounds.  Pulmonary:     Effort: Pulmonary effort is normal.     Breath sounds: Normal breath sounds.  Skin:    General: Skin is warm.     Findings: Rash present.     Comments: Erythematous macular rash on right side of neck. No vesicular  lesions noted  Neurological:     General: No focal deficit present.     Mental Status: She is alert.  Psychiatric:        Mood and Affect: Mood normal.        Behavior: Behavior normal.      Assessment And Plan:     1. Hypertensive nephropathy Comments: Chronic, controlled. She has both metoprolol and nebivolol in chart. I will contact Dr. Zenia Resides office to see which medication she should be on.  - BMP8+EGFR - AMB Referral to [place] - Lipid panel  2. Chronic renal disease, stage II Comments: Chronic, encouraged to avoid NSAIDs, stay hydrated and keep BP controlled to decrease risk of CKD progression.  - AMB Referral to [facility]  3. Irritant contact dermatitis due to metals Comments: I will send rx triamcinolone cream to apply to affected area BID prn. She will let me know if her sx persist/worsen.   4. Mild intermittent asthma without complication Comments: She states her inhalers are costly. She agrees to [facility] referral to pharmacist for patient assistance.  - AMB Referral to [place]  5. Other abnormal glucose Comments: Her a1c/BS have been elevated in the past. I will recheck this today. She is encouraged to limit her intake of sugary foods and beverages.  - Hemoglobin A1c  6. Class 1 obesity due to excess calories with serious comorbidity and body mass index (BMI) of 31.0 to 31.9 in adult Comments: She is encouraged to initially strive for BMI less than 30 to decrease cardiac risk. She is advised to exercise no less than 150 minutes per week.    7. Immunization due - Pneumococcal conjugate vaccine 20-valent (Prevnar-20)  8. Polypharmacy Comments: She is on chronic PPI therapy. I will check vitamin B12 level today.  - Vitamin B12   Patient was given opportunity to ask questions. Patient verbalized understanding of the plan and was able to repeat key elements of the plan. All questions were answered to their satisfaction.   I, [name], MD, have reviewed all documentation for this visit. The documentation on 10/27/21 for the exam, diagnosis, procedures, and orders are all accurate and complete.   IF YOU HAVE BEEN REFERRED TO A SPECIALIST, IT MAY TAKE 1-2 WEEKS TO SCHEDULE/PROCESS THE REFERRAL. IF YOU HAVE NOT HEARD FROM US/SPECIALIST IN TWO WEEKS, PLEASE GIVE [place] A CALL AT [phone].   THE PATIENT IS ENCOURAGED TO PRACTICE SOCIAL DISTANCING DUE TO THE COVID-19 PANDEMIC.

## 2021-10-28 ENCOUNTER — Telehealth: Payer: Self-pay | Admitting: *Deleted

## 2021-10-28 LAB — LIPID PANEL
Chol/HDL Ratio: 2.1 ratio (ref 0.0–4.4)
Cholesterol, Total: 126 mg/dL (ref 100–199)
HDL: 61 mg/dL (ref >39)
LDL Chol Calc (NIH): 46 mg/dL (ref 0–99)
Triglycerides: 105 mg/dL (ref 0–149)
VLDL Cholesterol Cal: 19 mg/dL (ref 5–40)

## 2021-10-28 LAB — BMP8+EGFR
BUN/Creatinine Ratio: 16 (ref 12–28)
BUN: 13 mg/dL (ref 8–27)
CO2: 24 mmol/L (ref 20–29)
Calcium: 9 mg/dL (ref 8.7–10.3)
Chloride: 101 mmol/L (ref 96–106)
Creatinine, Ser: 0.81 mg/dL (ref 0.57–1.00)
Glucose: 94 mg/dL (ref 70–99)
Potassium: 3.9 mmol/L (ref 3.5–5.2)
Sodium: 138 mmol/L (ref 134–144)
eGFR: 69 mL/min/{1.73_m2} (ref >59)

## 2021-10-28 LAB — VITAMIN B12: Vitamin B-12: >2000 pg/mL — ABNORMAL HIGH (ref 232–1245)

## 2021-10-28 LAB — HEMOGLOBIN A1C
Est. average glucose Bld gHb Est-mCnc: 120 mg/dL
Hgb A1c MFr Bld: 5.8 % — ABNORMAL HIGH (ref 4.8–5.6)

## 2021-10-28 NOTE — Chronic Care Management (AMB) (Signed)
  Chronic Care Management   Outreach Note  10/28/2021 Name: Paula Pollard MRN: [id number] DOB: [birth year]  Paula Pollard is a 88 y.o. year old female who is a primary care patient of Glendale Chard, MD. I reached out to Paula Pollard by phone today in response to a referral sent by Ms. Valentina Lucks Cham's primary care provider.  An unsuccessful telephone outreach was attempted today. The patient was referred to the case management team for assistance with care management and care coordination.   Follow Up Plan: A HIPAA compliant phone message was left for the patient providing contact information and requesting a return call. The care management team will reach out to the patient again over the next 3-5 days. If patient returns call to provider office, please advise to call [place] at [phone].  [place] Management  Direct Dial: [phone]

## 2021-10-31 NOTE — Chronic Care Management (AMB) (Signed)
  Chronic Care Management   Note  10/31/2021 Name: Paula Pollard MRN: [id number] DOB: 1929/09/17  Paula Pollard is a 88 y.o. year old female who is a primary care patient of Glendale Chard, MD. I reached out to [name] by phone Pollard in response to a referral sent by Paula Pollard's PCP.  Paula Pollard including:  CCM service includes personalized support from designated clinical staff supervised by her physician, including individualized plan of care and coordination with other care providers 24/7 contact phone numbers for assistance for urgent and routine care needs. Service will only be billed when office clinical staff spend 20 minutes or more in a month to coordinate care. Only one practitioner may furnish and bill the service in a calendar month. The patient may stop CCM services at any time (effective at the end of the month) by phone call to the office staff. The patient is responsible for co-pay (up to 20% after annual deductible is met) if co-pay is required by the individual health plan.   Patient agreed to services and verbal consent obtained.   Follow up plan: Telephone appointment with care management team member scheduled for:11/07/21  [place] Management  Direct Dial: [phone]

## 2021-11-06 ENCOUNTER — Telehealth: Payer: Self-pay

## 2021-11-06 NOTE — Chronic Care Management (AMB) (Addendum)
Chronic Care Management Pharmacy Assistant   Name: Paula Pollard  MRN: [id number] DOB: [birth year]  Reason for Encounter: Chart review for CPP visit on 11-07-2021   Conditions to be addressed/monitored: HTN, HLD, GERD, Allergic Rhinitis, and Osteoarthritis  Recent office visits:  10-27-2021 Glendale Chard, MD. STOP calcium. START kenelog cream apply to neck twice daily as needed. A1C= 5.8.   Recent consult visits:  10-09-2021 Valentina Shaggy, MD (Allergy/immunology). Spirometry with graph completed. STOP dymista, ativan and kenelog cream.  08-19-2021 Charolette Forward, MD (Cardiology). Unable to view encounter.  07-31-2021 Leta Baptist, MD (Otolaryngology). Unable to view encounter.  05-20-2021 Hyman Hopes, MD (Ophthalmology). Visit for cataract exam.  05-13-2021 Charolette Forward, MD (Cardiology). Unable to view encounter.  Hospital visits:  Medication Reconciliation was completed by comparing discharge summary, patient's EMR and Pharmacy list, and upon discussion with patient.  Admitted to the hospital on 07-15-2021 due to Vertigo. Discharge date was 07-15-2021 Discharged from [place]?Medications Started at [facility] Discharge:?? None  Medication Changes at Hospital Discharge: None  Medications Discontinued at Hospital Discharge: None  Medications that remain the same after Hospital Discharge:??  -All other medications will remain the same.    Medications: Outpatient Encounter Medications as of 11/06/2021  Medication Sig   acetaminophen (TYLENOL) 500 MG tablet Take 500 mg by mouth every 6 (six) hours as needed for moderate pain or headache.   albuterol (VENTOLIN HFA) 108 (90 Base) MCG/ACT inhaler INHALE 2 PUFFS INTO THE LUNGS EVERY 4 HOURS AS NEEDED FOR WHEEZING OR SHORTNESS OF BREATH.   Ascorbic Acid (VITAMIN C PO) Take 1 tablet by mouth daily.   aspirin EC 81 MG tablet Take 81 mg by mouth daily.   atorvastatin (LIPITOR) 40 MG tablet  Take 40 mg by mouth daily.   azelastine (ASTELIN) 0.1 % nasal spray Place 1 spray into both nostrils daily. Use in each nostril as directed   cholecalciferol (VITAMIN D) 400 units TABS tablet Take 600 Units by mouth daily.   Cyanocobalamin (B-12 PO) Take 500 mcg by mouth daily.   esomeprazole (NEXIUM) 20 MG capsule Take 20 mg by mouth as needed (for heartburn).   fluticasone (FLONASE) 50 MCG/ACT nasal spray Place 1 spray into both nostrils daily.   fluticasone furoate-vilanterol (BREO ELLIPTA) 100-25 MCG/ACT AEPB Inhale 1 puff into the lungs daily.   fluticasone-salmeterol (ADVAIR HFA) 115-21 MCG/ACT inhaler Inhale 2 puffs into the lungs 2 (two) times daily.   hydrochlorothiazide (HYDRODIURIL) 12.5 MG tablet Take 1 tablet (12.5 mg total) by mouth daily.   meclizine (ANTIVERT) 25 MG tablet Take 12.5 mg by mouth 3 (three) times daily as needed for dizziness.   metoprolol succinate (TOPROL-XL) 50 MG 24 hr tablet Take 50 mg by mouth daily.   Multiple Vitamins-Minerals (ECHINACEA ACZ PO) Take 1 capsule by mouth as needed (for immune support).   nebivolol (BYSTOLIC) 5 MG tablet Take 1 tablet (5 mg total) by mouth daily.   nitroGLYCERIN (NITROSTAT) 0.4 MG SL tablet Place 0.4 mg under the tongue every 5 (five) minutes as needed for chest pain.   Polyethyl Glycol-Propyl Glycol 0.4-0.3 % SOLN Apply 1 drop to eye 2 (two) times a day. systane   telmisartan (MICARDIS) 20 MG tablet Take 1 tablet (20 mg total) by mouth daily.   triamcinolone cream (KENALOG) 0.1 % APPLY TO rash on neck TWICE DAILY AS NEEDED   VENTOLIN HFA 108 (90 Base) MCG/ACT inhaler Inhale 2 puffs into the lungs every 6 (  six) hours as needed for wheezing or shortness of breath.   vitamin E 400 UNIT capsule Take 400 Units by mouth daily.   [DISCONTINUED] Calcium Carbonate-Vitamin D (CALCIUM-VITAMIN D3 PO) Take 1 tablet by mouth every morning. '1200mg'$  of calcium   No facility-administered encounter medications on file as of 11/06/2021.   Lab  Results  Component Value Date/Time   HGBA1C 5.8 (H) 10/27/2021 11:58 AM   HGBA1C 5.8 (H) 04/23/2021 11:27 AM   MICROALBUR 10 04/23/2021 03:46 PM   MICROALBUR 10 04/18/2020 03:20 PM     BP Readings from Last 3 Encounters:  10/27/21 130/80  10/09/21 (!) 142/80  07/15/21 (!) 141/73    11-06-2021: 1st attempt left VM  11-11-2021: Left patient a VM to clarify which inhalers are needed for patient assistance  Care Gaps: Last annual wellness visit? 04-23-2021 Last Colonoscopy? 08-01-2018 Last Mammogram? 04-09-2021 Last Bone [place]? None   Star Rating Drugs: Atorvastatin 40 mg- Last filled 08-29-2021 [facility]  [place] Clinical Pharmacist Assistant [phone]

## 2021-11-07 ENCOUNTER — Ambulatory Visit (INDEPENDENT_AMBULATORY_CARE_PROVIDER_SITE_OTHER): Payer: Medicare Other

## 2021-11-07 DIAGNOSIS — I129 Hypertensive chronic kidney disease with stage 1 through stage 4 chronic kidney disease, or unspecified chronic kidney disease: Secondary | ICD-10-CM

## 2021-11-07 DIAGNOSIS — J45901 Unspecified asthma with (acute) exacerbation: Secondary | ICD-10-CM

## 2021-11-07 NOTE — Progress Notes (Signed)
Chronic Care Management Pharmacy Note  11/11/2021 Name:  Paula Pollard MRN:  [id number] DOB:  [birth year]  Summary: Patient reports that she needs assistance with the cost of some of her medications    Recommendations/Changes made from today's visit: Recommend patient assistance be completed for patients medications.   Plan: Patient assistance applications to be completed for patients inhalers that she is currently using.    Subjective: Paula Pollard is an 88 y.o. year old female who is a primary patient of Glendale Chard, MD.  The CCM team was consulted for assistance with disease management and care coordination needs.    Engaged with patient by telephone for initial visit in response to provider referral for pharmacy case management and/or care coordination services.   Consent to Services:  The patient was given the following information about Chronic Care Management services today, agreed to services, and gave verbal consent: 1. CCM service includes personalized support from designated clinical staff supervised by the primary care provider, including individualized plan of care and coordination with other care providers 2. 24/7 contact phone numbers for assistance for urgent and routine care needs. 3. Service will only be billed when office clinical staff spend 20 minutes or more in a month to coordinate care. 4. Only one practitioner may furnish and bill the service in a calendar month. 5.The patient may stop CCM services at any time (effective at the end of the month) by phone call to the office staff. 6. The patient will be responsible for cost sharing (co-pay) of up to 20% of the service fee (after annual deductible is met). Patient agreed to services and consent obtained.  Patient Care Team: [place] Chard, MD as PCP - General (Internal Medicine) Rutherford Guys, MD as Consulting Physician (Ophthalmology) Mayford Knife, [facility] (Pharmacist)  Recent office visits:   10-27-2021 Gl[place]ale Chard, MD. STOP calcium. START kenelog cream apply to neck twice daily as needed. A1C= 5.8.    Recent consult visits:  10-09-2021 Valentina Shaggy, MD (Allergy/immunology). Spirometry with graph completed. STOP dymista, ativan and kenelog cream.   08-19-2021 Charolette Forward, MD (Cardiology). Unable to view encounter.   07-31-2021 Leta Baptist, MD (Otolaryngology). Unable to view encounter.   05-20-2021 Hyman Hopes, MD (Ophthalmology). Visit for cataract exam.   05-13-2021 Charolette Forward, MD (Cardiology). Unable to view encounter.   Hospital visits:  Medication Reconciliation was completed by comparing discharge summary, patient's EMR and Pharmacy list, and upon discussion with patient.   Admitted to the hospital on 07-15-2021 due to Vertigo. Discharge date was 07-15-2021 Discharged from [place]?Medications Started at [facility] Discharge:?? None   Medication Changes at Hospital Discharge: None   Medications Discontinued at Hospital Discharge: None   Medications that remain the same after Hospital Discharge:??  -All other medications will remain the same.    Hospital visits: None in previous 6 months   Objective:  Lab Results  Component Value Date   CREATININE 0.81 10/27/2021   BUN 13 10/27/2021   EGFR 69 10/27/2021   GFRNONAA 68 04/18/2020   GFRAA 78 04/18/2020   NA 138 10/27/2021   K 3.9 10/27/2021   CALCIUM 9.0 10/27/2021   CO2 24 10/27/2021   GLUCOSE 94 10/27/2021    Lab Results  Component Value Date/Time   HGBA1C 5.8 (H) 10/27/2021 11:58 AM   HGBA1C 5.8 (H) 04/23/2021 11:27 AM   MICROALBUR 10 04/23/2021 03:46 PM   MICROALBUR 10 04/18/2020 03:20 PM  Last diabetic Eye exam: No results found for: HMDIABEYEEXA  Last diabetic Foot exam: No results found for: HMDIABFOOTEX   Lab Results  Component Value Date   CHOL 126 10/27/2021   HDL 61 10/27/2021   LDLCALC 46 10/27/2021   TRIG 105 10/27/2021    CHOLHDL 2.1 10/27/2021       Latest Ref Rng & Units 04/23/2021   11:27 AM 04/18/2020   12:23 PM 04/12/2019   11:28 AM  Hepatic Function  Total Protein 6.0 - 8.5 g/dL 6.9   7.4   7.6    Albumin 3.6 - 4.6 g/dL 4.0   4.3   4.6    AST 0 - 40 IU/L '25   20   21    ' ALT 0 - 32 IU/L '16   13   14    ' Alk Phosphatase 44 - 121 IU/L 63   70   83    Total Bilirubin 0.0 - 1.2 mg/dL 0.5   0.4   0.5      Lab Results  Component Value Date/Time   TSH 3.250 04/23/2021 11:27 AM   TSH 2.460 12/28/2018 02:26 PM       Latest Ref Rng & Units 07/15/2021    4:30 PM 07/15/2021    4:22 PM 04/23/2021   11:27 AM  CBC  WBC 4.0 - 10.5 K/uL  4.8   3.6    Hemoglobin 12.0 - 15.0 g/dL 12.6   12.2   12.6    Hematocrit 36.0 - 46.0 % 37.0   39.0   38.2    Platelets 150 - 400 K/uL  217   207      No results found for: VD25OH  Clinical ASCVD: No  The ASCVD Risk score (Arnett DK, et al., 2019) failed to calculate for the following reasons:   The 2019 ASCVD risk score is only valid for ages 57 to 46       04/23/2021   10:12 AM 04/18/2020   11:14 AM 10/04/2019    2:58 PM  Depression screen PHQ 2/9  Decreased Interest 0 0 0  Down, Depressed, Hopeless 0 1 0  PHQ - 2 Score 0 1 0  Altered sleeping  0   Tired, decreased energy  0   Change in appetite  0   Feeling bad or failure about yourself   0   Trouble concentrating  0   Moving slowly or fidgety/restless  0   Suicidal thoughts  0   PHQ-9 Score  1   Difficult doing work/chores  Not difficult at all      Social History   Tobacco Use  Smoking Status Never  Smokeless Tobacco Never   BP Readings from Last 3 Encounters:  10/27/21 130/80  10/09/21 (!) 142/80  07/15/21 (!) 141/73   Pulse Readings from Last 3 Encounters:  10/27/21 77  10/09/21 95  07/15/21 71   Wt Readings from Last 3 Encounters:  10/27/21 174 lb 12.8 oz (79.3 kg)  07/15/21 170 lb (77.1 kg)  04/23/21 173 lb 1 oz (78.5 kg)   BMI Readings from Last 3 Encounters:  10/27/21 31.56  kg/m  07/15/21 32.12 kg/m  04/23/21 32.70 kg/m    Assessment/Interventions: Review of patient past medical history, allergies, medications, health status, including review of consultants reports, laboratory and other test data, was performed as part of comprehensive evaluation and provision of chronic care management services.   SDOH:  (Social Determinants of Health) assessments and interventions performed: Yes  SDOH Screenings   Alcohol Screen: Not on file  Depression (PHQ2-9): Low Risk    PHQ-2 Score: 0  Financial Resource Strain: High Risk   Difficulty of Paying Living Expenses: Very hard  Food Insecurity: No Food Insecurity   Worried About Charity fundraiser in the Last Year: Never true   Ran Out of Food in the Last Year: Never true  Housing: Not on file  Physical Activity: Inactive   Days of Exercise per Week: 0 days   Minutes of Exercise per Session: 0 min  Social Connections: Not on file  Stress: No Stress Concern Present   Feeling of Stress : Not at all  Tobacco Use: Low Risk    Smoking Tobacco Use: Never   Smokeless Tobacco Use: Never   Passive Exposure: Not on file  Transportation Needs: No Transportation Needs   Lack of Transportation (Medical): No   Lack of Transportation (Non-Medical): No    CCM Care Plan  Allergies  Allergen Reactions   Codeine Nausea Only and Other (See Comments)    Reaction:Dizziness and hallucinations "makes me climb walls"   Doxycycline Other (See Comments)    Medications Reviewed Today     Reviewed by Mayford Knife, RPH (Pharmacist) on 11/07/21 at 1128  Med List Status: <None>   Medication Order Taking? Sig Documenting Provider Last Dose Status Informant  acetaminophen (TYLENOL) 500 MG tablet [id number] No Take 500 mg by mouth every 6 (six) hours as needed for moderate pain or headache. [provider] Taking Active Self  albuterol (VENTOLIN HFA) 108 (90 Base) MCG/ACT inhaler [id number] No INHALE 2 PUFFS INTO THE  LUNGS EVERY 4 HOURS AS NEEDED FOR WHEEZING OR SHORTNESS OF BREATH. Valentina Shaggy, MD Taking Active   Ascorbic Acid (VITAMIN C PO) [id number] No Take 1 tablet by mouth daily. [provider] Taking Active Self  aspirin EC 81 MG tablet [id number] No Take 81 mg by mouth daily. [provider] Taking Active Self  atorvastatin (LIPITOR) 40 MG tablet [id number] No Take 40 mg by mouth daily. [provider] Taking Active Self  azelastine (ASTELIN) 0.1 % nasal spray [id number] No Place 1 spray into both nostrils daily. Use in each nostril as directed Valentina Shaggy, MD Taking Active   Discontinued 08/25/12 1845 (Reorder) cholecalciferol (VITAMIN D) 400 units TABS tablet [id number] No Take 600 Units by mouth daily. [provider] Taking Active Self  Cyanocobalamin (B-12 PO) [id number] No Take 500 mcg by mouth daily. [provider] Taking Active Self  esomeprazole (NEXIUM) 20 MG capsule [id number] No Take 20 mg by mouth as needed (for heartburn). [provider] Taking Active Self  fluticasone (FLONASE) 50 MCG/ACT nasal spray [id number] No Place 1 spray into both nostrils daily. Valentina Shaggy, MD Taking Active   fluticasone furoate-vilanterol (BREO ELLIPTA) 100-25 MCG/ACT AEPB [id number] No Inhale 1 puff into the lungs daily. Valentina Shaggy, MD Taking Active   fluticasone-salmeterol [facility]) [phone] MCG/ACT inhaler [id number] No Inhale 2 puffs into the lungs 2 (two) times daily. Valentina Shaggy, MD Taking Active   hydrochlorothiazide (HYDRODIURIL) 12.5 MG tablet [id number] No Take 1 tablet (12.5 mg total) by mouth daily. Gl[place] Chard, MD Taking Active   meclizine (ANTIVERT) 25 MG tablet [id number] No Take 12.5 mg by mouth 3 (three) times daily as needed for dizziness. [provider] Taking Active Self  metoprolol succinate (TOPROL-XL) 50 MG 24 hr tablet [id number] No Take 50 mg by mouth daily. [provider]  Taking Active Self  Multiple Vitamins-Minerals (ECHINACEA ACZ PO) [id number] No Take 1 capsule by mouth as needed (for immune support). [provider] Taking Active Self  nebivolol (BYSTOLIC) 5 MG tablet [id number] No Take 1 tablet (5 mg total) by mouth daily. Medina-Vargas, Monina C, NP Taking Active Self  nitroGLYCERIN (NITROSTAT) 0.4 MG SL tablet [id number] No Place 0.4 mg under the tongue every 5 (five) minutes as needed for chest pain. [provider] Taking Active Self  Polyethyl Glycol-Propyl Glycol 0.4-0.3 % SOLN [id number] No Apply 1 drop to eye 2 (two) times a day. systane [provider] Taking Active Self  telmisartan (MICARDIS) 20 MG tablet [id number] No Take 1 tablet (20 mg total) by mouth daily. [place] Chard, MD Taking Active   triamcinolone cream (KENALOG) 0.1 % [id number]  APPLY TO rash on neck TWICE DAILY AS NEEDED Gl[place]ale Chard, MD  Active   VENTOLIN HFA 108 [phone] Base) MCG/ACT inhaler [id number] No Inhale 2 puffs into the lungs every 6 (six) hours as needed for wheezing or shortness of breath. Valentina Shaggy, MD Taking Active   vitamin E 400 UNIT capsule [id number] No Take 400 Units by mouth daily. [provider] Taking Active Self            Patient Active Problem List   Diagnosis Date Noted   Hypertensive nephropathy 04/23/2021   Chronic renal disease, stage II 04/23/2021   Hair loss 04/23/2021   Other abnormal glucose 04/23/2021   Mild intermittent asthma 06/26/2019   Vertigo 10/28/2018   Hypokalemia 10/28/2018   Acute sinusitis 10/05/2017   Other allergic rhinitis 10/05/2017   Asthma with acute exacerbation 03/06/2015   Unexplained night sweats 03/06/2015   Cough 02/16/2015   Essential hypertension, benign 08/25/2012   Other and unspecified hyperlipidemia 08/25/2012   Benign paroxysmal positional vertigo 08/25/2012   Unspecified constipation 08/25/2012   Generalized anxiety disorder 08/25/2012   GERD (gastroesophageal  reflux disease) 08/25/2012   Osteoarthritis of right hip 08/10/2012    Immunization History  Administered Date(s) Administered   Moderna Sars-Covid-2 Vaccination 07/23/2019, 08/21/2019   PNEUMOCOCCAL CONJUGATE-20 10/27/2021   Pneumococcal Polysaccharide-23 04/18/2020    Conditions to be addressed/monitored:  Hypertension and Asthma  Care Plan : [place]  Updates made by Mayford Knife, [place] since 11/11/2021 12:00 AM     Problem: HTN, Asthma      Long-Range Goal: Disease Management   Note:    Current Barriers:  Unable to independently afford treatment regimen  Pharmacist Clinical Goal(s):  Patient will achieve adherence to monitoring guidelines and medication adherence to achieve therapeutic efficacy through collaboration with PharmD and provider.   Interventions: 1:1 collaboration with Gl[place]ale Chard, MD regarding development and update of comprehensive plan of care as evidenced by provider attestation and co-signature Inter-disciplinary care team collaboration (see longitudinal plan of care) Comprehensive medication review performed; medication list updated in electronic medical record  Hypertension (BP goal <140/90) -Controlled -Current treatment: Bystolic 5 mg tablet once per day Appropriate, Effective, Safe, Accessible Metoprolol succinate 50 mg tablet once per day Appropriate, Effective, Safe, Accessible Telmisartan 20 mg tablet once per day Appropriate, Query effective Hydrochlorothiazide 12.5 mg tablet once per day  Appropriate, Effective, Safe, Accessible -Current home readings: 160/70, 150/70 pulse around 77 -Current dietary habits: Ms. Mcnall does not use any salt on her food  -Current exercise habits: will discuss during next office visit.  -Denies hypotensive/hypertensive symptoms -Educated on BP goals and benefits of medications for prevention of heart attack, stroke  and kidney damage; Importance of home blood pressure monitoring; Proper BP  monitoring technique;  -Patient reports that her BP cuff is brand new -Counseled to monitor BP at home at least once per day, document, and provide log at future appointments -Counseled on diet and exercise extensively Collaborated with patient and recommended that patient have BP checked in office, and follow up    Asthma (Goal: control symptoms and prevent exacerbations) -Controlled -Current treatment  Ventolin HFA 108 (90 base) - Inhale 2 puffs into the lungs every 4 hours as needed for wheezing or shortness of breath. Appropriate, Effective, Safe, Accessible Advair HFA - inhale 2 puffs into the lungs 2 times daily. Appropriate, Effective, Safe, Query accessible  -Exacerbations requiring treatment in last 6 months: Not applicable -Patient reports consistent use of maintenance inhaler -Frequency of rescue inhaler use: Patient reports that she rarely needs to use her rescue inhaler. -Counseled on Proper inhaler technique; Benefits of consistent maintenance inhaler use -Recommended to continue current medication   Patient Goals/Self-Care Activities Patient will:  - collaborate with provider on medication access solutions  Follow Up Plan: The patient has been provided with contact information for the care management team and has been advised to call with any health related questions or concerns.       Medication Assistance: Application for [employer]  medication assistance program. in process.  Anticipated assistance start date 12/2021.  See plan of care for additional detail.  Compliance/Adherence/Medication fill history: Care Gaps: COVID-19 Vaccine   Star-Rating Drugs: Atorvastatin 40 mg tablet Telmisartan 20 mg tablet   Patient's preferred pharmacy is:  [id number] [place], [place] - [address] [place]Phone: [phone]: [phone] [id number] [place] - [address] [id number]: [phone]: [phone] pill box? Yes Pt endorses 95% compliance  We discussed: Benefits of medication synchronization, packaging and delivery as well as enhanced pharmacist oversight with [employer]. Patient decided to: Continue current medication management strategy  Care Plan and Follow Up Patient Decision:  Patient agrees to Care Plan and Follow-up.  Plan: The patient has been provided with contact information for the care management team and has been advised to call with any health related questions or concerns.   VaOrlando PennerCPP, PharmD Clinical Pharmacist Practitioner [facility] [phone]

## 2021-11-11 NOTE — Patient Instructions (Signed)
Visit Information It was great speaking with you today!  Please let me know if you have any questions about our visit.   Goals Addressed             This Visit's Progress    Manage My Medicine       Timeframe:  Long-Range Goal Priority:  High Start Date:                             Expected End Date:                       Follow Up Date 03/17/2022    - call if I am sick and can't take my medicine - keep a list of all the medicines I take; vitamins and herbals too - use an alarm clock or phone to remind me to take my medicine    Why is this important?   These steps will help you keep on track with your medicines.   Notes:  -Will be in contact with you to help with your patient assistance application for Advair HFA     Track and Manage My Symptoms-Asthma       Timeframe:  Long-Range Goal Priority:  High Start Date:                             Expected End Date:                       Follow Up Date 03/17/2022   - avoid symptom triggers outdoors - eliminate symptom triggers at home - keep follow-up appointments - keep rescue medicines on hand    Why is this important?   Keeping track of asthma symptoms can tell you a lot about your asthma control.  Based on symptoms and peak flow results you can see how well you are doing.  Your asthma action plan has a green, yellow and red zone. Green means all is good; it is your goal. Yellow means your symptoms are a little worse. You will need to adjust your medications. Being in the red zone means that your   asthma is out of control. You will need to use your rescue medicines. You may need emergency care.     Notes:  -Thank you for talking with me today. Please let me know if you have any questions.         Patient Care Plan: CCM Pharmacy Care Plan     Problem Identified: HTN, Asthma      Long-Range Goal: Disease Management   Note:    Current Barriers:  Unable to independently afford treatment regimen  Pharmacist  Clinical Goal(s):  Patient will achieve adherence to monitoring guidelines and medication adherence to achieve therapeutic efficacy through collaboration with PharmD and provider.   Interventions: 1:1 collaboration with [place] Chard, MD regarding development and update of comprehensive plan of care as evidenced by provider attestation and co-signature Inter-disciplinary care team collaboration (see longitudinal plan of care) Comprehensive medication review performed; medication list updated in electronic medical record  Hypertension (BP goal <140/90) -Controlled -Current treatment: Bystolic 5 mg tablet once per day Appropriate, Effective, Safe, Accessible Metoprolol succinate 50 mg tablet once per day Appropriate, Effective, Safe, Accessible Telmisartan 20 mg tablet once per day Appropriate, Query effective Hydrochlorothiazide 12.5 mg tablet once per day  Appropriate, Effective, Safe,  Accessible -Current home readings: 160/70, 150/70 pulse around 77 -Current dietary habits: Paula Pollard does not use any salt on her food  -Current exercise habits: will discuss during next office visit.  -Denies hypotensive/hypertensive symptoms -Educated on BP goals and benefits of medications for prevention of heart attack, stroke and kidney damage; Importance of home blood pressure monitoring; Proper BP monitoring technique;  -Patient reports that her BP cuff is brand new -Counseled to monitor BP at home at least once per day, document, and provide log at future appointments -Counseled on diet and exercise extensively Collaborated with patient and recommended that patient have BP checked in office, and follow up    Asthma (Goal: control symptoms and prevent exacerbations) -Controlled -Current treatment  Ventolin HFA 108 (90 base) - Inhale 2 puffs into the lungs every 4 hours as needed for wheezing or shortness of breath. Appropriate, Effective, Safe, Accessible Advair HFA - inhale 2 puffs into the  lungs 2 times daily. Appropriate, Effective, Safe, Query accessible  -Exacerbations requiring treatment in last 6 months: Not applicable -Patient reports consistent use of maintenance inhaler -Frequency of rescue inhaler use: Patient reports that she rarely needs to use her rescue inhaler. -Counseled on Proper inhaler technique; Benefits of consistent maintenance inhaler use -Recommended to continue current medication   Patient Goals/Self-Care Activities Patient will:  - collaborate with provider on medication access solutions  Follow Up Plan: The patient has been provided with contact information for the care management team and has been advised to call with any health related questions or concerns.      Paula Pollard was given information about Chronic Care Management services today including:  CCM service includes personalized support from designated clinical staff supervised by her physician, including individualized plan of care and coordination with other care providers 24/7 contact phone numbers for assistance for urgent and routine care needs. Standard insurance, coinsurance, copays and deductibles apply for chronic care management only during months in which we provide at least 20 minutes of these services. Most insurances cover these services at 100%, however patients may be responsible for any copay, coinsurance and/or deductible if applicable. This service may help you avoid the need for more expensive face-to-face services. Only one practitioner may furnish and bill the service in a calendar month. The patient may stop CCM services at any time (effective at the end of the month) by phone call to the office staff.  Patient agreed to services and verbal consent obtained.   The patient verbalized understanding of instructions, educational materials, and care plan provided today and agreed to receive a mailed copy of patient instructions, educational materials, and care plan.    Orlando Penner, PharmD Clinical Pharmacist Practitioner  [facility] [phone]

## 2021-11-12 ENCOUNTER — Telehealth: Payer: Self-pay

## 2021-11-12 NOTE — Telephone Encounter (Signed)
Patient called me back per my request to explain the cost of her most recent medication that she picked up from the pharmacy. She is currently taking Advair twice per day, and she has 102 actuations left. Patient is aware that assistant will contact her and she has requested for her to leave a voicemail, she is okay with a descriptive voicemail. She is going to call the concierge back when she has a chance.   Orlando Penner, CPP, PharmD Clinical Pharmacist Practitioner [facility] [phone]

## 2021-11-12 NOTE — Addendum Note (Signed)
Addended by: Mayford Knife on: 11/12/2021 10:54 AM   Modules accepted: Orders

## 2021-11-13 NOTE — Progress Notes (Signed)
11-13-2021: Completed/ uploaded patient assistance application for advair.   [place] Pharmacist Assistant [phone]

## 2021-11-26 DIAGNOSIS — I10 Essential (primary) hypertension: Secondary | ICD-10-CM | POA: Diagnosis not present

## 2021-11-26 DIAGNOSIS — I5030 Unspecified diastolic (congestive) heart failure: Secondary | ICD-10-CM | POA: Diagnosis not present

## 2021-11-26 DIAGNOSIS — I2510 Atherosclerotic heart disease of native coronary artery without angina pectoris: Secondary | ICD-10-CM | POA: Diagnosis not present

## 2021-11-26 DIAGNOSIS — I38 Endocarditis, valve unspecified: Secondary | ICD-10-CM | POA: Diagnosis not present

## 2021-12-05 DIAGNOSIS — I10 Essential (primary) hypertension: Secondary | ICD-10-CM

## 2021-12-05 DIAGNOSIS — J45909 Unspecified asthma, uncomplicated: Secondary | ICD-10-CM | POA: Diagnosis not present

## 2021-12-18 ENCOUNTER — Other Ambulatory Visit: Payer: Self-pay | Admitting: Allergy & Immunology

## 2022-01-08 ENCOUNTER — Telehealth: Payer: Self-pay

## 2022-01-08 NOTE — Chronic Care Management (AMB) (Signed)
Chronic Care Management Pharmacy Assistant   Name: Paula Pollard  MRN: [id number] DOB: June 14, 1929  Reason for Encounter: Disease State/ Hypertension  Recent office visits:  None  Recent consult visits:  None  Hospital visits:  Medication Reconciliation was completed by comparing discharge summary, patient's EMR and Pharmacy list, and upon discussion with patient.   Admitted to the hospital on 07-15-2021 due to Vertigo. Discharge date was 07-15-2021 Discharged from [place]?Medications Started at [facility] Discharge:?? None   Medication Changes at Hospital Discharge: None   Medications Discontinued at Hospital Discharge: None   Medications that remain the same after Hospital Discharge:??  -All other medications will remain the same.    Medications: Outpatient Encounter Medications as of 01/08/2022  Medication Sig   acetaminophen (TYLENOL) 500 MG tablet Take 500 mg by mouth every 6 (six) hours as needed for moderate pain or headache.   albuterol (VENTOLIN HFA) 108 (90 Base) MCG/ACT inhaler INHALE 2 PUFFS INTO THE LUNGS EVERY 4 HOURS AS NEEDED FOR WHEEZING OR SHORTNESS OF BREATH.   Ascorbic Acid (VITAMIN C PO) Take 1 tablet by mouth daily.   aspirin EC 81 MG tablet Take 81 mg by mouth daily.   atorvastatin (LIPITOR) 40 MG tablet Take 40 mg by mouth daily.   azelastine (ASTELIN) 0.1 % nasal spray Place 1 spray into both nostrils daily. Use in each nostril as directed   cholecalciferol (VITAMIN D) 400 units TABS tablet Take 600 Units by mouth daily.   Cyanocobalamin (B-12 PO) Take 500 mcg by mouth daily.   esomeprazole (NEXIUM) 20 MG capsule Take 20 mg by mouth as needed (for heartburn).   fluticasone (FLONASE) 50 MCG/ACT nasal spray PLACE 2 SPRAYS INTO BOTH NOSTRILS DAILY AS NEEDED FOR ALLERGIES OR RHINITIS.   fluticasone-salmeterol (ADVAIR HFA) 115-21 MCG/ACT inhaler Inhale 2 puffs into the lungs 2 (two) times daily.   hydrochlorothiazide  (HYDRODIURIL) 12.5 MG tablet Take 1 tablet (12.5 mg total) by mouth daily.   meclizine (ANTIVERT) 25 MG tablet Take 12.5 mg by mouth 3 (three) times daily as needed for dizziness.   metoprolol succinate (TOPROL-XL) 50 MG 24 hr tablet Take 50 mg by mouth daily.   Multiple Vitamins-Minerals (ECHINACEA ACZ PO) Take 1 capsule by mouth as needed (for immune support).   nebivolol (BYSTOLIC) 5 MG tablet Take 1 tablet (5 mg total) by mouth daily.   nitroGLYCERIN (NITROSTAT) 0.4 MG SL tablet Place 0.4 mg under the tongue every 5 (five) minutes as needed for chest pain.   Polyethyl Glycol-Propyl Glycol 0.4-0.3 % SOLN Apply 1 drop to eye 2 (two) times a day. systane   telmisartan (MICARDIS) 20 MG tablet Take 1 tablet (20 mg total) by mouth daily.   triamcinolone cream (KENALOG) 0.1 % APPLY TO rash on neck TWICE DAILY AS NEEDED   VENTOLIN HFA 108 (90 Base) MCG/ACT inhaler Inhale 2 puffs into the lungs every 6 (six) hours as needed for wheezing or shortness of breath.   vitamin E 400 UNIT capsule Take 400 Units by mouth daily.   [DISCONTINUED] Calcium Carbonate-Vitamin D (CALCIUM-VITAMIN D3 PO) Take 1 tablet by mouth every morning. '1200mg'$  of calcium   No facility-administered encounter medications on file as of 01/08/2022.  Reviewed chart prior to disease state call. Spoke with patient regarding BP  Recent Office Vitals: BP Readings from Last 3 Encounters:  10/27/21 130/80  10/09/21 (!) 142/80  07/15/21 (!) 141/73   Pulse Readings from Last 3 Encounters:  10/27/21 77  10/09/21 95  07/15/21 71    Wt Readings from Last 3 Encounters:  10/27/21 174 lb 12.8 oz (79.3 kg)  07/15/21 170 lb (77.1 kg)  04/23/21 173 lb 1 oz (78.5 kg)     Kidney Function Lab Results  Component Value Date/Time   CREATININE 0.81 10/27/2021 11:58 AM   CREATININE 0.60 07/15/2021 04:30 PM   GFRNONAA 68 04/18/2020 12:23 PM   GFRAA 78 04/18/2020 12:23 PM       Latest Ref Rng & Units 10/27/2021   11:58 AM 07/15/2021    4:30  PM 04/23/2021   11:27 AM  BMP  Glucose 70 - 99 mg/dL 94  114  97   BUN 8 - 27 mg/dL '13  14  13   '$ Creatinine 0.57 - 1.00 mg/dL 0.81  0.60  0.88   BUN/Creat Ratio 12 - '28 16   15   '$ Sodium 134 - 144 mmol/L 138  140  142   Potassium 3.5 - 5.2 mmol/L 3.9  3.6  4.1   Chloride 96 - 106 mmol/L 101  103  100   CO2 20 - 29 mmol/L 24   27   Calcium 8.7 - 10.3 mg/dL 9.0   9.3     Current antihypertensive regimen:  Bystolic 5 mg daily Telmisartan 20 mg daily HCTZ 12.5 mg daily  How often are you checking your Blood Pressure? daily  Current home BP readings: 08-03 138/74 78, 134/63 86 tues, 08-02 143/84 77  What recent interventions/DTPs have been made by any provider to improve Blood Pressure control since last CPP Visit:  Educated on BP goals and benefits of medications for prevention of heart attack, stroke and kidney damage; Importance of home blood pressure monitoring; Proper BP monitoring technique;  -Patient reports that her BP cuff is brand new -Counseled to monitor BP at home at least once per day, document, and provide log at future appointments -Counseled on diet and exercise extensively Collaborated with patient and recommended that patient have BP checked in office, and follow up   Any recent hospitalizations or ED visits since last visit with CPP? No  What diet changes have been made to improve Blood Pressure Control?  Patient stated she limits salt intake and drinks plenty of water  What exercise is being done to improve your Blood Pressure Control?  Patient stated she walks daily, runs errands and does stretching exercises.  Adherence Review: Is the patient currently on ACE/ARB medication? Yes Does the patient have >5 day gap between last estimated fill dates? No  Care Gaps: Flu vaccine overdue AWV 12.12-2021  Star Rating Drugs: Atorvastatin 40 mg- [facility]) Telmisartan 20 mg- Last filled [facility]-30-[facility]21 [facility])  [place] Clinical Pharmacist Assistant [phone]

## 2022-02-24 DIAGNOSIS — I10 Essential (primary) hypertension: Secondary | ICD-10-CM | POA: Diagnosis not present

## 2022-02-24 DIAGNOSIS — I519 Heart disease, unspecified: Secondary | ICD-10-CM | POA: Diagnosis not present

## 2022-02-24 DIAGNOSIS — I38 Endocarditis, valve unspecified: Secondary | ICD-10-CM | POA: Diagnosis not present

## 2022-02-24 DIAGNOSIS — I2510 Atherosclerotic heart disease of native coronary artery without angina pectoris: Secondary | ICD-10-CM | POA: Diagnosis not present

## 2022-03-07 ENCOUNTER — Other Ambulatory Visit: Payer: Self-pay | Admitting: Allergy & Immunology

## 2022-03-13 ENCOUNTER — Telehealth: Payer: Self-pay

## 2022-03-13 NOTE — Chronic Care Management (AMB) (Signed)
03-13-2022: Left patient a VM to call and reschedule 03-17-2022 appointment with Orlando Penner due to provider being out of office. Appointment canceled  [place] Clinical Pharmacist Assistant [phone]

## 2022-03-17 ENCOUNTER — Telehealth: Payer: Medicare Other

## 2022-03-19 DIAGNOSIS — M25551 Pain in right hip: Secondary | ICD-10-CM | POA: Diagnosis not present

## 2022-04-01 ENCOUNTER — Telehealth: Payer: Self-pay | Admitting: Allergy & Immunology

## 2022-04-01 ENCOUNTER — Other Ambulatory Visit: Payer: Self-pay | Admitting: Internal Medicine

## 2022-04-01 MED ORDER — VENTOLIN HFA 108 (90 BASE) MCG/ACT IN AERS: 2.0000 | INHALATION_SPRAY | RESPIRATORY_TRACT | 1 refills | Status: DC | PRN

## 2022-04-01 NOTE — Telephone Encounter (Signed)
Pt last seen 05/23 sent in refill of ventolin for pt

## 2022-04-01 NOTE — Telephone Encounter (Signed)
Patient is requesting refill for her ventolin inhaler.   [facility] [address] [id number]  Best contact number: [phone] or [phone]

## 2022-04-15 ENCOUNTER — Telehealth: Payer: Self-pay

## 2022-04-15 NOTE — Chronic Care Management (AMB) (Signed)
    Called [name], No answer, left message of appointment on 04-17-2022 at 11:00 via telephone visit with Orlando Penner, Pharm D. Notified to have all medications, supplements, blood pressure and/or blood sugar logs available during appointment and to return call if need to reschedule.   Care Gaps: Shingrix overdue Covid booster overdue Flu vaccine overdue Mammogram overdue  Star Rating Drug: Atorvastatin 40 mg- Last filled [facility]-13-2022 [facility] Telmisartan 20 mg- Last filled [facility]  Any gaps in medications fill history? No  [place] Pharmacist Assistant [phone]

## 2022-04-16 ENCOUNTER — Ambulatory Visit: Payer: Medicare Other | Admitting: Allergy & Immunology

## 2022-04-17 ENCOUNTER — Telehealth: Payer: Medicare Other

## 2022-04-17 ENCOUNTER — Telehealth: Payer: Self-pay

## 2022-04-17 DIAGNOSIS — Z1231 Encounter for screening mammogram for malignant neoplasm of breast: Secondary | ICD-10-CM | POA: Diagnosis not present

## 2022-04-17 LAB — HM MAMMOGRAPHY

## 2022-04-17 NOTE — Telephone Encounter (Signed)
Patient reports that today is not a good day to talk because her grand daughter is stopping by her house and then she other appointments through the day. I let Ms. Gallicchio know we can change the appointment to her preferred time. Appointment rescheduled.  Orlando Penner, CPP, PharmD Clinical Pharmacist Practitioner [facility] [phone]

## 2022-04-17 NOTE — Progress Notes (Incomplete)
Current Barriers:  {pharmacybarriers:24917}  Pharmacist Clinical Goal(s):  Patient will {PHARMACYGOALCHOICES:24921} through collaboration with PharmD and provider.   Interventions: 1:1 collaboration with Glendale Chard, MD regarding development and update of comprehensive plan of care as evidenced by provider attestation and co-signature Inter-disciplinary care team collaboration (see longitudinal plan of care) Comprehensive medication review performed; medication list updated in electronic medical record  {CCM [facility] DISEASE STATES:25130}  Patient Goals/Self-Care Activities Patient will:  - {pharmacypatientgoals:24919}  Follow Up Plan: {CM FOLLOW UP GEXB:28413}  Chronic Care Management Pharmacy Note  04/17/2022 Name:  Paula Pollard MRN:  [id number] DOB:  06-27-1929  Summary: ***  Recommendations/Changes made from today's visit: ***  Plan: ***   Subjective: Paula Pollard is an 88 y.o. year old female who is a primary patient of Glendale Chard, MD.  The CCM team was consulted for assistance with disease management and care coordination needs.    {CCMTELEPHONEFACETOFACE:21091510} for {CCMINITIALFOLLOWUPCHOICE:21091511} in response to provider referral for pharmacy case management and/or care coordination services.   Consent to Services:  {CCMCONSENTOPTIONS:25074}  Patient Care Team: Glendale Chard, MD as PCP - General (Internal Medicine) Rutherford Guys, MD as Consulting Physician (Ophthalmology) Mayford Knife, [facility] (Pharmacist)  Recent office visits: ***  Recent consult visits: [facility] visits: {Hospital DC Yes/No:25215}   Objective:  Lab Results  Component Value Date   CREATININE 0.81 10/27/2021   BUN 13 10/27/2021   EGFR 69 10/27/2021   GFRNONAA 68 04/18/2020   GFRAA 78 04/18/2020   NA 138 10/27/2021   K 3.9 10/27/2021   CALCIUM 9.0 10/27/2021   CO2 24 10/27/2021   GLUCOSE 94 10/27/2021    Lab Results  Component Value Date/Time    HGBA1C 5.8 (H) 10/27/2021 11:58 AM   HGBA1C 5.8 (H) 04/23/2021 11:27 AM   MICROALBUR 10 04/23/2021 03:46 PM   MICROALBUR 10 04/18/2020 03:20 PM    Last diabetic Eye exam: No results found for: "HMDIABEYEEXA"  Last diabetic Foot exam: No results found for: "HMDIABFOOTEX"   Lab Results  Component Value Date   CHOL 126 10/27/2021   HDL 61 10/27/2021   LDLCALC 46 10/27/2021   TRIG 105 10/27/2021   CHOLHDL 2.1 10/27/2021       Latest Ref Rng & Units 04/23/2021   11:27 AM 04/18/2020   12:23 PM 04/12/2019   11:28 AM  Hepatic Function  Total Protein 6.0 - 8.5 g/dL 6.9  7.4  7.6   Albumin 3.6 - 4.6 g/dL 4.0  4.3  4.6   AST 0 - 40 IU/L _0 ALT 0 - 32 IU/L _1 Alk Phosphatase 44 - 121 IU/L 63  70  83   Total Bilirubin 0.0 - 1.2 mg/dL 0.5  0.4  0.5     Lab Results  Component Value Date/Time   TSH 3.250 04/23/2021 11:27 AM   TSH 2.460 12/28/2018 02:26 PM       Latest Ref Rng & Units 07/15/2021    4:30 PM 07/15/2021    4:22 PM 04/23/2021   11:27 AM  CBC  WBC 4.0 - 10.5 K/uL  4.8  3.6   Hemoglobin 12.0 - 15.0 g/dL 12.6  12.2  12.6   Hematocrit 36.0 - 46.0 % 37.0  39.0  38.2   Platelets 150 - 400 K/uL  217  207     Lab Results  Component Value Date/Time   VITAMINB12 >2000 (H) 10/27/2021 11:58 AM   VITAMINB12 >2000 (  H) 08/12/2020 03:00 PM    Clinical ASCVD: {YES/NO:21197} The ASCVD Risk score (Arnett DK, et al., 2019) failed to calculate for the following reasons:   The 2019 ASCVD risk score is only valid for ages 21 to 79       04/23/2021   10:12 AM 04/18/2020   11:14 AM 10/04/2019    2:58 PM  Depression screen PHQ 2/9  Decreased Interest 0 0 0  Down, Depressed, Hopeless 0 1 0  PHQ - 2 Score 0 1 0  Altered sleeping  0   Tired, decreased energy  0   Change in appetite  0   Feeling bad or failure about yourself   0   Trouble concentrating  0   Moving slowly or fidgety/restless  0   Suicidal thoughts  0   PHQ-9 Score  1   Difficult doing  work/chores  Not difficult at all      ***Other: (CHADS2VASc if Afib, MMRC or CAT for COPD, ACT, DEXA)  Social History   Tobacco Use  Smoking Status Never  Smokeless Tobacco Never   BP Readings from Last 3 Encounters:  10/27/21 130/80  10/09/21 (!) 142/80  07/15/21 (!) 141/73   Pulse Readings from Last 3 Encounters:  10/27/21 77  10/09/21 95  07/15/21 71   Wt Readings from Last 3 Encounters:  10/27/21 174 lb 12.8 oz (79.3 kg)  07/15/21 170 lb (77.1 kg)  04/23/21 173 lb 1 oz (78.5 kg)   BMI Readings from Last 3 Encounters:  10/27/21 31.56 kg/m  07/15/21 32.12 kg/m  04/23/21 32.70 kg/m    Assessment/Interventions: Review of patient past medical history, allergies, medications, health status, including review of consultants reports, laboratory and other test data, was performed as part of comprehensive evaluation and provision of chronic care management services.   SDOH:  (Social Determinants of Health) assessments and interventions performed: {yes/no:20286} SDOH Interventions    Flowsheet Row Clinical Support from 04/18/2020 in [facility] from 04/12/2019 in [facility]  SDOH Interventions    Depression Interventions/Treatment  PHQ2-9 Score <4 Follow-up Not Indicated PHQ2-9 Score <4 Follow-up Not Indicated      SDOH Screenings   Food Insecurity: No Food Insecurity (04/23/2021)  Housing: Low Risk  (05/17/2019)  Transportation Needs: No Transportation Needs (04/23/2021)  Depression (PHQ2-9): Low Risk  (04/23/2021)  Financial Resource Strain: High Risk (11/07/2021)  Physical Activity: Inactive (04/23/2021)  Stress: No Stress Concern Present (04/23/2021)  Tobacco Use: Low Risk  (10/27/2021)    CCM Care Plan  Allergies  Allergen Reactions   Codeine Nausea Only and Other (See Comments)    Reaction:Dizziness and hallucinations "makes me climb walls"   Doxycycline Other (See Comments)    Medications  Reviewed Today     Reviewed by Mayford Knife, [place] (Pharmacist) on 11/07/21 at 1128  Med List Status: <None>   Medication Order Taking? Sig Documenting Provider Last Dose Status Informant  acetaminophen (TYLENOL) 500 MG tablet [id number] No Take 500 mg by mouth every 6 (six) hours as needed for moderate pain or headache. [provider] Taking Active Self  albuterol (VENTOLIN HFA) 108 (90 Base) MCG/ACT inhaler [id number] No INHALE 2 PUFFS INTO THE LUNGS EVERY 4 HOURS AS NEEDED FOR WHEEZING OR SHORTNESS OF BREATH. Valentina Shaggy, MD Taking Active   Ascorbic Acid (VITAMIN C PO) [id number] No Take 1 tablet by mouth daily. [provider] Taking Active Self  aspirin EC 81 MG tablet [id number] No Take  81 mg by mouth daily. [provider] Taking Active Self  atorvastatin (LIPITOR) 40 MG tablet [id number] No Take 40 mg by mouth daily. [provider] Taking Active Self  azelastine (ASTELIN) 0.1 % nasal spray [id number] No Place 1 spray into both nostrils daily. Use in each nostril as directed Valentina Shaggy, MD Taking Active   Discontinued 08/25/12 1845 (Reorder) cholecalciferol (VITAMIN D) 400 units TABS tablet [id number] No Take 600 Units by mouth daily. [provider] Taking Active Self  Cyanocobalamin (B-12 PO) [id number] No Take 500 mcg by mouth daily. [provider] Taking Active Self  esomeprazole (NEXIUM) 20 MG capsule [id number] No Take 20 mg by mouth as needed (for heartburn). [provider] Taking Active Self  fluticasone (FLONASE) 50 MCG/ACT nasal spray [id number] No Place 1 spray into both nostrils daily. Valentina Shaggy, MD Taking Active   fluticasone furoate-vilanterol (BREO ELLIPTA) 100-25 MCG/ACT AEPB [id number] No Inhale 1 puff into the lungs daily. Valentina Shaggy, MD Taking Active   fluticasone-salmeterol [facility]) [phone] MCG/ACT inhaler [id number] No Inhale 2 puffs into the lungs 2 (two) times daily.  Valentina Shaggy, MD Taking Active   hydrochlorothiazide (HYDRODIURIL) 12.5 MG tablet [id number] No Take 1 tablet (12.5 mg total) by mouth daily. Glendale Chard, MD Taking Active   meclizine (ANTIVERT) 25 MG tablet [id number] No Take 12.5 mg by mouth 3 (three) times daily as needed for dizziness. [provider] Taking Active Self  metoprolol succinate (TOPROL-XL) 50 MG 24 hr tablet [id number] No Take 50 mg by mouth daily. [provider] Taking Active Self  Multiple Vitamins-Minerals (ECHINACEA ACZ PO) [id number] No Take 1 capsule by mouth as needed (for immune support). [provider] Taking Active Self  nebivolol (BYSTOLIC) 5 MG tablet [id number] No Take 1 tablet (5 mg total) by mouth daily. Medina-Vargas, Monina C, NP Taking Active Self  nitroGLYCERIN (NITROSTAT) 0.4 MG SL tablet [id number] No Place 0.4 mg under the tongue every 5 (five) minutes as needed for chest pain. [provider] Taking Active Self  Polyethyl Glycol-Propyl Glycol 0.4-0.3 % SOLN [id number] No Apply 1 drop to eye 2 (two) times a day. systane [provider] Taking Active Self  telmisartan (MICARDIS) 20 MG tablet [id number] No Take 1 tablet (20 mg total) by mouth daily. Glendale Chard, MD Taking Active   triamcinolone cream (KENALOG) 0.1 % [id number]  APPLY TO rash on neck TWICE DAILY AS NEEDED Glendale Chard, MD  Active   VENTOLIN HFA 108 [phone] Base) MCG/ACT inhaler [id number] No Inhale 2 puffs into the lungs every 6 (six) hours as needed for wheezing or shortness of breath. Valentina Shaggy, MD Taking Active   vitamin E 400 UNIT capsule [id number] No Take 400 Units by mouth daily. [provider] Taking Active Self            Patient Active Problem List   Diagnosis Date Noted   Hypertensive nephropathy 04/23/2021   Chronic renal disease, stage II 04/23/2021   Hair loss 04/23/2021   Other abnormal glucose 04/23/2021   Mild intermittent asthma 06/26/2019   Vertigo  10/28/2018   Hypokalemia 10/28/2018   Acute sinusitis 10/05/2017   Other allergic rhinitis 10/05/2017   Asthma with acute exacerbation 03/06/2015   Unexplained night sweats 03/06/2015   Cough 02/16/2015   Essential hypertension, benign 08/25/2012   Other and unspecified hyperlipidemia 08/25/2012   Benign paroxysmal positional vertigo 08/25/2012   Unspecified constipation 08/25/2012   Generalized anxiety disorder 08/25/2012   GERD (  gastroesophageal reflux disease) 08/25/2012   Osteoarthritis of right hip 08/10/2012    Immunization History  Administered Date(s) Administered   Moderna Sars-Covid-2 Vaccination 07/23/2019, 08/21/2019   PNEUMOCOCCAL CONJUGATE-20 10/27/2021   Pneumococcal Polysaccharide-23 04/18/2020    Conditions to be addressed/monitored:  {USCCMDZASSESSMENTOPTIONS:23563}  There are no care plans that you recently modified to display for this patient.    Medication Assistance: {MEDASSISTANCEINFO:25044}  Compliance/Adherence/Medication fill history: Care Gaps: ***  Star-Rating Drugs: ***  Patient[facility]  [id number] [place] [place] [place] [id number]: [phone]: [phone] [id number] [place] [id number] [id number]: [phone]: [phone] pill box? {Yes or If no, why not?:20788} Pt endorses ***% compliance  We discussed: {Pharmacy options:24294} Patient decided to: {US Pharmacy Plan:23885}  Care Plan and Follow Up Patient Decision:  {FOLLOWUP:24991}  Plan: {CM FOLLOW UP PLAN:25073}  ***

## 2022-04-23 ENCOUNTER — Telehealth: Payer: Self-pay

## 2022-04-23 NOTE — Progress Notes (Signed)
  Paula Pollard was reminded to have all medications, supplements and any blood glucose and blood pressure readings available for review with Paula Pollard, Pharm. D, at her telephone visit on 04-28-2022 at 8:45   Questions: Have you had any recent office visit or specialist visit outside of [place]? Patient stated orthopedic in October  Are there any concerns you would like to discuss during your office visit? Patient stated no  Are you having any problems obtaining your medications? (Whether it pharmacy issues or cost). Patient stated no  If patient has any PAP medications ask if they are having any problems getting their PAP medication or refill? No PAP medication  Care Gaps: Shingrix overdue Covid booster overdue Flu vaccine overdue Mammogram overdue AWV overdue  Star Rating Drug: Atorvastatin 40 mg- Last filled [facility]13-[facility]22 [facility] Telmisartan 20 mg- Last [facility]  Any gaps in medications fill history? No  [place] Pharmacist Assistant [phone]

## 2022-04-28 ENCOUNTER — Ambulatory Visit (INDEPENDENT_AMBULATORY_CARE_PROVIDER_SITE_OTHER): Payer: Medicare Other

## 2022-04-28 DIAGNOSIS — J45901 Unspecified asthma with (acute) exacerbation: Secondary | ICD-10-CM

## 2022-04-28 DIAGNOSIS — I10 Essential (primary) hypertension: Secondary | ICD-10-CM

## 2022-04-28 NOTE — Progress Notes (Signed)
Chronic Care Management Pharmacy Note  04/28/2022 Name:  Paula Pollard MRN:  [id number] DOB:  10/04/1929  Summary: [name] reports that she is doing well. Her medication was changed by the cardiologist, she is currently taking Metoprolol XL and no longer taking Nebivolol.   Recommendations/Changes made from today's visit: Recommend patient receive Shingrix vaccine and COVID-19 Booster  Recommend patient receive mammogram [name] is currently taking metoprolol 50 mg tablet daily and no longer taking nebivolol.   Plan: Discontinued Nebivilol on patients current medication list.  Confirmed she did not take both Nebivolol and Metoprolol at the same time [name] is not going to receive any vaccinations at this time.    Subjective: Paula Pollard is an 88 y.o. year old female who is a primary patient of Glendale Chard, MD.  The CCM team was consulted for assistance with disease management and care coordination needs.    Engaged with patient by telephone for follow up visit in response to provider referral for pharmacy case management and/or care coordination services.   Consent to Services:  The patient was given information about Chronic Care Management services, agreed to services, and gave verbal consent prior to initiation of services.  Please see initial visit note for detailed documentation.   Patient Care Team: Glendale Chard, MD as PCP - General (Internal Medicine) Rutherford Guys, MD as Consulting Physician (Ophthalmology) Mayford Knife, [facility] (Pharmacist)  Recent office visits: 10/27/2021 PCP OV   Recent consult visits: 10/09/2021 [facility] visits: None in previous 6 months   Objective:  Lab Results  Component Value Date   CREATININE 0.81 10/27/2021   BUN 13 10/27/2021   EGFR 69 10/27/2021   GFRNONAA 68 04/18/2020   GFRAA 78 04/18/2020   NA 138 10/27/2021   K 3.9 10/27/2021   CALCIUM 9.0 10/27/2021   CO2 24 10/27/2021    GLUCOSE 94 10/27/2021    Lab Results  Component Value Date/Time   HGBA1C 5.8 (H) 10/27/2021 11:58 AM   HGBA1C 5.8 (H) 04/23/2021 11:27 AM   MICROALBUR 10 04/23/2021 03:46 PM   MICROALBUR 10 04/18/2020 03:20 PM    Last diabetic Eye exam: No results found for: "HMDIABEYEEXA"  Last diabetic Foot exam: No results found for: "HMDIABFOOTEX"   Lab Results  Component Value Date   CHOL 126 10/27/2021   HDL 61 10/27/2021   LDLCALC 46 10/27/2021   TRIG 105 10/27/2021   CHOLHDL 2.1 10/27/2021       Latest Ref Rng & Units 04/23/2021   11:27 AM 04/18/2020   12:23 PM 04/12/2019   11:28 AM  Hepatic Function  Total Protein 6.0 - 8.5 g/dL 6.9  7.4  7.6   Albumin 3.6 - 4.6 g/dL 4.0  4.3  4.6   AST 0 - 40 IU/L _0 ALT 0 - 32 IU/L _1 Alk Phosphatase 44 - 121 IU/L 63  70  83   Total Bilirubin 0.0 - 1.2 mg/dL 0.5  0.4  0.5     Lab Results  Component Value Date/Time   TSH 3.250 04/23/2021 11:27 AM   TSH 2.460 12/28/2018 02:26 PM       Latest Ref Rng & Units 07/15/2021    4:30 PM 07/15/2021    4:22 PM 04/23/2021   11:27 AM  CBC  WBC 4.0 - 10.5 K/uL  4.8  3.6   Hemoglobin 12.0 - 15.0 g/dL 12.6  12.2  12.6   Hematocrit 36.0 - 46.0 % 37.0  39.0  38.2   Platelets 150 - 400 K/uL  217  207     Lab Results  Component Value Date/Time   VITAMINB12 >2000 (H) 10/27/2021 11:58 AM   VITAMINB12 >2000 (H) 08/12/2020 03:00 PM    Clinical ASCVD: No  The ASCVD Risk score (Arnett DK, et al., 2019) failed to calculate for the following reasons:   The 2019 ASCVD risk score is only valid for ages 56 to 30       04/23/2021   10:12 AM 04/18/2020   11:14 AM 10/04/2019    2:58 PM  Depression screen PHQ 2/9  Decreased Interest 0 0 0  Down, Depressed, Hopeless 0 1 0  PHQ - 2 Score 0 1 0  Altered sleeping  0   Tired, decreased energy  0   Change in appetite  0   Feeling bad or failure about yourself   0   Trouble concentrating  0   Moving slowly or fidgety/restless  0   Suicidal  thoughts  0   PHQ-9 Score  1   Difficult doing work/chores  Not difficult at all      Social History   Tobacco Use  Smoking Status Never  Smokeless Tobacco Never   BP Readings from Last 3 Encounters:  10/27/21 130/80  10/09/21 (!) 142/80  07/15/21 (!) 141/73   Pulse Readings from Last 3 Encounters:  10/27/21 77  10/09/21 95  07/15/21 71   Wt Readings from Last 3 Encounters:  10/27/21 174 lb 12.8 oz (79.3 kg)  07/15/21 170 lb (77.1 kg)  04/23/21 173 lb 1 oz (78.5 kg)   BMI Readings from Last 3 Encounters:  10/27/21 31.56 kg/m  07/15/21 32.12 kg/m  04/23/21 32.70 kg/m    Assessment/Interventions: Review of patient past medical history, allergies, medications, health status, including review of consultants reports, laboratory and other test data, was performed as part of comprehensive evaluation and provision of chronic care management services.   SDOH:  (Social Determinants of Health) assessments and interventions performed: No SDOH Interventions    Flowsheet Row Clinical Support from 04/18/2020 in [place] from 04/12/2019 in [place]  SDOH Interventions    Depression Interventions/Treatment  PHQ2-9 Score <4 Follow-up Not Indicated PHQ2-9 Score <4 Follow-up Not Indicated      SDOH Screenings   Food Insecurity: No Food Insecurity (04/23/2021)  Housing: Low Risk  (05/17/2019)  Transportation Needs: No Transportation Needs (04/23/2021)  Depression (PHQ2-9): Low Risk  (04/23/2021)  Financial Resource Strain: High Risk (11/07/2021)  Physical Activity: Inactive (04/23/2021)  Stress: No Stress Concern Present (04/23/2021)  Tobacco Use: Low Risk  (10/27/2021)    CCM Care Plan  Allergies  Allergen Reactions   Codeine Nausea Only and Other (See Comments)    Reaction:Dizziness and hallucinations "makes me climb walls"   Doxycycline Other (See Comments)    Medications Reviewed Today     Reviewed by  Mayford Knife, [place] (Pharmacist) on 04/28/22 at [phone]  Med List Status: <None>   Medication Order Taking? Sig Documenting Provider Last Dose Status Informant  acetaminophen (TYLENOL) 500 MG tablet [id number] No Take 500 mg by mouth every 6 (six) hours as needed for moderate pain or headache. [provider] Taking Active Self  albuterol (VENTOLIN HFA) 108 (90 Base) MCG/ACT inhaler [id number] No INHALE 2 PUFFS INTO THE LUNGS EVERY 4 HOURS AS NEEDED FOR WHEEZING OR SHORTNESS OF BREATH. Ernst Bowler,  Gwenith Daily, MD Taking Active   Ascorbic Acid (VITAMIN C PO) [id number] No Take 1 tablet by mouth daily. [provider] Taking Active Self  aspirin EC 81 MG tablet [id number] No Take 81 mg by mouth daily. [provider] Taking Active Self  atorvastatin (LIPITOR) 40 MG tablet [id number] No Take 40 mg by mouth daily. [provider] Taking Active Self  azelastine (ASTELIN) 0.1 % nasal spray [id number] No Place 1 spray into both nostrils daily. Use in each nostril as directed Valentina Shaggy, MD Taking Active   Discontinued 08/25/12 1845 (Reorder) cholecalciferol (VITAMIN D) 400 units TABS tablet [id number] No Take 600 Units by mouth daily. [provider] Taking Active Self  Cyanocobalamin (B-12 PO) [id number] No Take 500 mcg by mouth daily. [provider] Taking Active Self  esomeprazole (NEXIUM) 20 MG capsule [id number] No Take 20 mg by mouth as needed (for heartburn). [provider] Taking Active Self  fluticasone (FLONASE) 50 MCG/ACT nasal spray [id number]  PLACE 2 SPRAYS INTO BOTH NOSTRILS DAILY AS NEEDED FOR ALLERGIES OR RHINITIS. Valentina Shaggy, MD  Active   fluticasone-salmeterol [facility]) [phone] MCG/ACT inhaler [id number] No Inhale 2 puffs into the lungs 2 (two) times daily. Valentina Shaggy, MD Taking Active   hydrochlorothiazide (HYDRODIURIL) 12.5 MG tablet [id number]  TAKE 1 TABLET BY MOUTH EVERY DAY Glendale Chard, MD  Active    meclizine (ANTIVERT) 25 MG tablet [id number] No Take 12.5 mg by mouth 3 (three) times daily as needed for dizziness. [provider] Taking Active Self  metoprolol succinate (TOPROL-XL) 50 MG 24 hr tablet [id number] No Take 50 mg by mouth daily. [provider] Taking Active Self  Multiple Vitamins-Minerals (ECHINACEA ACZ PO) [id number] No Take 1 capsule by mouth as needed (for immune support). [provider] Taking Active Self  nebivolol (BYSTOLIC) 5 MG tablet [id number] No Take 1 tablet (5 mg total) by mouth daily. Medina-Vargas, Monina C, NP Taking Active Self  nitroGLYCERIN (NITROSTAT) 0.4 MG SL tablet [id number] No Place 0.4 mg under the tongue every 5 (five) minutes as needed for chest pain. [provider] Taking Active Self  Polyethyl Glycol-Propyl Glycol 0.4-0.3 % SOLN [id number] No Apply 1 drop to eye 2 (two) times a day. systane [provider] Taking Active Self  telmisartan (MICARDIS) 20 MG tablet [id number]  TAKE 1 TABLET BY MOUTH EVERY DAY Glendale Chard, MD  Active   triamcinolone cream (KENALOG) 0.1 % [id number]  APPLY TO rash on neck TWICE DAILY AS NEEDED Glendale Chard, MD  Active   VENTOLIN HFA 108 (90 Base) MCG/ACT inhaler [id number]  TAKE 2 PUFFS BY MOUTH EVERY 6 HOURS AS NEEDED FOR WHEEZE OR SHORTNESS OF Genella Mech, MD  Active   VENTOLIN HFA 108 [phone] Base) MCG/ACT inhaler [id number]  Inhale 2 puffs into the lungs every 4 (four) hours as needed for wheezing or shortness of breath. Valentina Shaggy, MD  Active   vitamin E 400 UNIT capsule [id number] No Take 400 Units by mouth daily. [provider] Taking Active Self            Patient Active Problem List   Diagnosis Date Noted   Hypertensive nephropathy 04/23/2021   Chronic renal disease, stage II 04/23/2021   Hair loss 04/23/2021   Other abnormal glucose 04/23/2021   Mild intermittent asthma 06/26/2019   Vertigo 10/28/2018   Hypokalemia 10/28/2018   Acute  sinusitis 10/05/2017   Other allergic rhinitis 10/05/2017   Asthma with acute  exacerbation 03/06/2015   Unexplained night sweats 03/06/2015   Cough 02/16/2015   Essential hypertension, benign 08/25/2012   Other and unspecified hyperlipidemia 08/25/2012   Benign paroxysmal positional vertigo 08/25/2012   Unspecified constipation 08/25/2012   Generalized anxiety disorder 08/25/2012   GERD (gastroesophageal reflux disease) 08/25/2012   Osteoarthritis of right hip 08/10/2012    Immunization History  Administered Date(s) Administered   Moderna Sars-Covid-2 Vaccination 07/23/2019, 08/21/2019   PNEUMOCOCCAL CONJUGATE-20 10/27/2021   Pneumococcal Polysaccharide-23 04/18/2020    Conditions to be addressed/monitored:  Hypertension and Asthma  Care Plan : [place]  Updates made by Mayford Knife, [place] since 04/28/2022 12:00 AM     Problem: HTN, Asthma      Long-Range Goal: Disease Management   Note:    Current Barriers:  Unable to independently afford treatment regimen  Pharmacist Clinical Goal(s):  Patient will achieve adherence to monitoring guidelines and medication adherence to achieve therapeutic efficacy through collaboration with PharmD and provider.   Interventions: 1:1 collaboration with Glendale Chard, MD regarding development and update of comprehensive plan of care as evidenced by provider attestation and co-signature Inter-disciplinary care team collaboration (see longitudinal plan of care) Comprehensive medication review performed; medication list updated in electronic medical record  Hypertension (BP goal <140/90) -Controlled -Current treatment: Bystolic 5 mg tablet once per day Appropriate, Effective, Safe, Accessible Metoprolol succinate 50 mg tablet once per day Appropriate, Effective, Safe, Accessible Telmisartan 20 mg tablet once per day Appropriate, Query effective Hydrochlorothiazide 12.5 mg tablet once per day  Appropriate, Effective,  Safe, Accessible -Current home readings: 160/70, 150/70 pulse around 77 -Current dietary habits: Ms. Schneeberger does not use any salt on her food  -Current exercise habits: will discuss during next office visit.  -Denies hypotensive/hypertensive symptoms -Educated on BP goals and benefits of medications for prevention of heart attack, stroke and kidney damage; Importance of home blood pressure monitoring; Proper BP monitoring technique;  -Patient reports that her BP cuff is brand new -Counseled to monitor BP at home at least once per day, document, and provide log at future appointments -Counseled on diet and exercise extensively Collaborated with patient and recommended that patient have BP checked in office, and follow up    Asthma (Goal: control symptoms and prevent exacerbations) -Controlled -Current treatment  Ventolin HFA 108 (90 base) - Inhale 2 puffs into the lungs every 4 hours as needed for wheezing or shortness of breath. Appropriate, Effective, Safe, Accessible Advair HFA - inhale 2 puffs into the lungs 2 times daily. Appropriate, Effective, Safe, Query accessible -Exacerbations requiring treatment in last 6 months: Not applicable -Patient reports consistent use of maintenance inhaler -Frequency of rescue inhaler use: Patient reports that she rarely needs to use her rescue inhaler. -Counseled on Proper inhaler technique; Benefits of consistent maintenance inhaler use -Recommended to continue current medication   Patient Goals/Self-Care Activities Patient will:  - collaborate with provider on medication access solutions  Follow Up Plan: The patient has been provided with contact information for the care management team and has been advised to call with any health related questions or concerns.       Medication Assistance: None required.  Patient affirms current coverage meets needs.  Compliance/Adherence/Medication fill history: Care Gaps: Shingrix Vaccine COVID-19  Vaccine Influenza Vaccine Mammogram   Star-Rating Drugs: Atorvastatin 40 mg tablet Telmisartan 20 mg tablet   Patient's preferred pharmacy is:  [id number]- [place], [place] - [id number]  [place] [address] Phone: [phone] Fax: [phone]  [id number] [place] [place] [address]Phone: [phone]: [phone] Uses pill box? Yes Pt endorses 95% compliance  We discussed: Benefits of medication synchronization, packaging and delivery as well as enhanced pharmacist oversight with [employer]. Patient decided to: Continue current medication management strategy  Care Plan and Follow Up Patient Decision:  Patient agrees to Care Plan and Follow-up.  Plan: The patient has been provided with contact information for the care management team and has been advised to call with any health related questions or concerns.   I have reviewed the care management and care coordination activities outlined in this encounter and I am certifying that I agree with the content of this note. No further action required.  VMayford Knife RVa [facility]11/21/23 10:23 AM

## 2022-04-28 NOTE — Patient Instructions (Signed)
Visit Information It was great speaking with you today!  Please let me know if you have any questions about our visit.   Goals Addressed             This Visit's Progress    Manage My Medicine       Timeframe:  Long-Range Goal Priority:  High Start Date:                             Expected End Date:                       Follow Up Date 10/25/2021   In Progess:   - call if I am sick and can't take my medicine - keep a list of all the medicines I take; vitamins and herbals too - use an alarm clock or phone to remind me to take my medicine    Why is this important?   These steps will help you keep on track with your medicines.   Notes:  -Will be in contact with you to help with your patient assistance application for Advair HFA       Patient Care Plan: CCM Pharmacy Care Plan     Problem Identified: HTN, Asthma      Long-Range Goal: Disease Management   Note:    Current Barriers:  Unable to independently afford treatment regimen  Pharmacist Clinical Goal(s):  Patient will achieve adherence to monitoring guidelines and medication adherence to achieve therapeutic efficacy through collaboration with PharmD and provider.   Interventions: 1:1 collaboration with Glendale Chard, MD regarding development and update of comprehensive plan of care as evidenced by provider attestation and co-signature Inter-disciplinary care team collaboration (see longitudinal plan of care) Comprehensive medication review performed; medication list updated in electronic medical record  Hypertension (BP goal <140/90) -Controlled -Current treatment: Bystolic 5 mg tablet once per day Appropriate, Effective, Safe, Accessible Metoprolol succinate 50 mg tablet once per day Appropriate, Effective, Safe, Accessible Telmisartan 20 mg tablet once per day Appropriate, Query effective Hydrochlorothiazide 12.5 mg tablet once per day  Appropriate, Effective, Safe, Accessible -Current home readings:  160/70, 150/70 pulse around 77 -Current dietary habits: Ms. Mentzer does not use any salt on her food  -Current exercise habits: will discuss during next office visit.  -Denies hypotensive/hypertensive symptoms -Educated on BP goals and benefits of medications for prevention of heart attack, stroke and kidney damage; Importance of home blood pressure monitoring; Proper BP monitoring technique;  -Patient reports that her BP cuff is brand new -Counseled to monitor BP at home at least once per day, document, and provide log at future appointments -Counseled on diet and exercise extensively Collaborated with patient and recommended that patient have BP checked in office, and follow up    Asthma (Goal: control symptoms and prevent exacerbations) -Controlled -Current treatment  Ventolin HFA 108 (90 base) - Inhale 2 puffs into the lungs every 4 hours as needed for wheezing or shortness of breath. Appropriate, Effective, Safe, Accessible Advair HFA - inhale 2 puffs into the lungs 2 times daily. Appropriate, Effective, Safe, Query accessible -Exacerbations requiring treatment in last 6 months: Not applicable -Patient reports consistent use of maintenance inhaler -Frequency of rescue inhaler use: Patient reports that she rarely needs to use her rescue inhaler. -Counseled on Proper inhaler technique; Benefits of consistent maintenance inhaler use -Recommended to continue current medication   Patient Goals/Self-Care Activities Patient will:  - collaborate  with provider on medication access solutions  Follow Up Plan: The patient has been provided with contact information for the care management team and has been advised to call with any health related questions or concerns.         Patient agreed to services and verbal consent obtained.   The patient verbalized understanding of instructions, educational materials, and care plan provided today and agreed to receive a mailed copy of patient  instructions, educational materials, and care plan.   Orlando Penner, PharmD Clinical Pharmacist [facility] [phone]

## 2022-05-05 ENCOUNTER — Encounter: Payer: Self-pay | Admitting: Internal Medicine

## 2022-05-07 DIAGNOSIS — I10 Essential (primary) hypertension: Secondary | ICD-10-CM

## 2022-05-07 DIAGNOSIS — J45901 Unspecified asthma with (acute) exacerbation: Secondary | ICD-10-CM

## 2022-05-14 ENCOUNTER — Ambulatory Visit (INDEPENDENT_AMBULATORY_CARE_PROVIDER_SITE_OTHER): Payer: Medicare Other

## 2022-05-14 ENCOUNTER — Ambulatory Visit (INDEPENDENT_AMBULATORY_CARE_PROVIDER_SITE_OTHER): Payer: Medicare Other | Admitting: Internal Medicine

## 2022-05-14 ENCOUNTER — Encounter: Payer: Self-pay | Admitting: Internal Medicine

## 2022-05-14 VITALS — BP 130/82 | HR 88 | Temp 98.5°F | Ht 61.4 in | Wt 177.7 lb

## 2022-05-14 VITALS — BP 130/82 | HR 88 | Temp 98.5°F | Ht 61.4 in | Wt 177.8 lb

## 2022-05-14 DIAGNOSIS — I129 Hypertensive chronic kidney disease with stage 1 through stage 4 chronic kidney disease, or unspecified chronic kidney disease: Secondary | ICD-10-CM

## 2022-05-14 DIAGNOSIS — Z23 Encounter for immunization: Secondary | ICD-10-CM

## 2022-05-14 DIAGNOSIS — N182 Chronic kidney disease, stage 2 (mild): Secondary | ICD-10-CM | POA: Diagnosis not present

## 2022-05-14 DIAGNOSIS — E6609 Other obesity due to excess calories: Secondary | ICD-10-CM | POA: Diagnosis not present

## 2022-05-14 DIAGNOSIS — E559 Vitamin D deficiency, unspecified: Secondary | ICD-10-CM

## 2022-05-14 DIAGNOSIS — Z0000 Encounter for general adult medical examination without abnormal findings: Secondary | ICD-10-CM | POA: Diagnosis not present

## 2022-05-14 DIAGNOSIS — E2839 Other primary ovarian failure: Secondary | ICD-10-CM

## 2022-05-14 DIAGNOSIS — R7309 Other abnormal glucose: Secondary | ICD-10-CM

## 2022-05-14 DIAGNOSIS — Z6833 Body mass index (BMI) 33.0-33.9, adult: Secondary | ICD-10-CM

## 2022-05-14 NOTE — Progress Notes (Addendum)
TDAP administered. Was billed through [facility] with patient consent.  Subjective:   Paula Pollard is a 88 y.o. female who presents for Medicare Annual (Subsequent) preventive examination.  Review of Systems     Cardiac Risk Factors include: advanced age (>16mn, >>69women);hypertension;obesity (BMI >30kg/m2)     Objective:    Today's Vitals   05/14/22 1107 05/14/22 1127  BP: (!) 160/90 130/82  Pulse: 88   Temp: 98.5 F (36.9 C)   TempSrc: Oral   SpO2: 95%   Weight: 177 lb 12.8 oz (80.6 kg)   Height: 5' 1.4" (1.56 m)    Body mass index is 33.16 kg/m.     05/14/2022   11:16 AM 07/15/2021    1:07 PM 04/23/2021   10:10 AM 12/29/2020    6:40 AM 04/18/2020   11:13 AM 04/12/2019    9:30 AM 10/28/2018    9:37 AM  Advanced Directives  Does Patient Have a Medical Advance Directive? Yes No Yes Yes Yes Yes No  Type of AParamedicof ANorth JudsonLiving will  HCamdenLiving will HOllieLiving will [place]Living will HLa EsperanzaLiving will   Does patient want to make changes to medical advance directive?    No - Patient declined     Copy of HKellyin Chart? No - copy requested  No - copy requested No - copy requested No - copy requested No - copy requested   Would patient like information on creating a medical advance directive?       No - Patient declined    Current Medications (verified) Outpatient Encounter Medications as of 05/14/2022  Medication Sig   acetaminophen (TYLENOL) 500 MG tablet Take 500 mg by mouth every 6 (six) hours as needed for moderate pain or headache.   albuterol (VENTOLIN HFA) 108 (90 Base) MCG/ACT inhaler INHALE 2 PUFFS INTO THE LUNGS EVERY 4 HOURS AS NEEDED FOR WHEEZING OR SHORTNESS OF BREATH.   Ascorbic Acid (VITAMIN C PO) Take 1 tablet by mouth daily.   aspirin EC 81 MG tablet Take 81 mg by mouth daily.   atorvastatin (LIPITOR)  40 MG tablet Take 40 mg by mouth daily.   azelastine (ASTELIN) 0.1 % nasal spray Place 1 spray into both nostrils daily. Use in each nostril as directed   cholecalciferol (VITAMIN D) 400 units TABS tablet Take 600 Units by mouth daily.   Cyanocobalamin (B-12 PO) Take 500 mcg by mouth daily.   esomeprazole (NEXIUM) 20 MG capsule Take 20 mg by mouth as needed (for heartburn).   fluticasone (FLONASE) 50 MCG/ACT nasal spray PLACE 2 SPRAYS INTO BOTH NOSTRILS DAILY AS NEEDED FOR ALLERGIES OR RHINITIS.   hydrochlorothiazide (HYDRODIURIL) 12.5 MG tablet TAKE 1 TABLET BY MOUTH EVERY DAY   meclizine (ANTIVERT) 25 MG tablet Take 12.5 mg by mouth 3 (three) times daily as needed for dizziness.   metoprolol succinate (TOPROL-XL) 50 MG 24 hr tablet Take 50 mg by mouth daily.   Multiple Vitamins-Minerals (ECHINACEA ACZ PO) Take 1 capsule by mouth as needed (for immune support).   nitroGLYCERIN (NITROSTAT) 0.4 MG SL tablet Place 0.4 mg under the tongue every 5 (five) minutes as needed for chest pain.   Polyethyl Glycol-Propyl Glycol 0.4-0.3 % SOLN Apply 1 drop to eye 2 (two) times a day. systane   telmisartan (MICARDIS) 20 MG tablet TAKE 1 TABLET BY MOUTH EVERY DAY   VENTOLIN HFA 108 (90 Base) MCG/ACT inhaler  TAKE 2 PUFFS BY MOUTH EVERY 6 HOURS AS NEEDED FOR WHEEZE OR SHORTNESS OF BREATH   VENTOLIN HFA 108 (90 Base) MCG/ACT inhaler Inhale 2 puffs into the lungs every 4 (four) hours as needed for wheezing or shortness of breath.   vitamin E 400 UNIT capsule Take 400 Units by mouth daily.   fluticasone-salmeterol (ADVAIR HFA) 115-21 MCG/ACT inhaler Inhale 2 puffs into the lungs 2 (two) times daily. (Patient not taking: Reported on 05/14/2022)   [DISCONTINUED] Calcium Carbonate-Vitamin D (CALCIUM-VITAMIN D3 PO) Take 1 tablet by mouth every morning. '1200mg'$  of calcium   No facility-administered encounter medications on file as of 05/14/2022.    Allergies (verified) Codeine and Doxycycline   History: Past Medical  History:  Diagnosis Date   Anemia    "in past as a young girl"   Anxiety    Arthritis    Asthma    Bronchitis    hx of   Cataract    bilaterally, "no surgery at this time"   Diabetes mellitus without complication ([place])    pre diabetic   Dysrhythmia    when gets excited   GERD (gastroesophageal reflux disease)    Headache(784.0)    hx of migraines   History of hiatal hernia    Hyperlipidemia    Hypertension    sees Dr. Terrence Dupont   Neuromuscular disorder [facility])    hx of carpal tunnel "never had surgery for"right hand   Past Surgical History:  Procedure Laterality Date   CARDIAC CATHETERIZATION     5 years ago no problems   COLONOSCOPY  07/2018   polypectomy x 5   DILATATION & CURETTAGE/HYSTEROSCOPY WITH MYOSURE N/A 01/06/2018   Procedure: DILATATION & CURETTAGE/HYSTEROSCOPY WITH MYOSURE;  Surgeon: Servando Salina, MD;  Location: [place] [facility];  Service: Gynecology;  Laterality: N/A;   DOPPLER ECHOCARDIOGRAPHY     hx of   NECK SURGERY     2002, cyst removed,    removal of toe nails     both feet   TOTAL HIP ARTHROPLASTY Right 08/08/2012   Dr Mayer Camel   TOTAL HIP ARTHROPLASTY Right 08/08/2012   Procedure: TOTAL HIP ARTHROPLASTY;  Surgeon: Kerin Salen, MD;  Location: [place];  Service: Orthopedics;  Laterality: Right;   TUBAL LIGATION     1970   Family History  Problem Relation Age of Onset   Heart attack Mother    Heart attack Father    Stroke Father    Stroke Daughter    Colon cancer Sister    Social History   Socioeconomic History   Marital status: Divorced    Spouse name: Not on file   Number of children: Not on file   Years of education: Not on file   Highest education level: Not on file  Occupational History   Occupation: retired  Tobacco Use   Smoking status: Never   Smokeless tobacco: Never  Vaping Use   Vaping Use: Never used  Substance and Sexual Activity   Alcohol use: No   Drug use: No   Sexual activity: Not Currently  Other Topics Concern   Not on  file  Social History Narrative   Not on file   Social Determinants of Health   Financial Resource Strain: [place]  (05/14/2022)   Overall Financial Resource Strain (CARDIA)    Difficulty of Paying Living Expenses: Not hard at all  Food Insecurity: No Food Insecurity (05/14/2022)   Hunger Vital Sign    Worried About Running Out of Food  in the Last Year: Never true    [place] in the Last Year: Never true  Transportation Needs: No Transportation Needs (05/14/2022)   PRAPARE - Hydrologist (Medical): No    Lack of Transportation (Non-Medical): No  Physical Activity: Insufficiently Active (05/14/2022)   Exercise Vital Sign    Days of Exercise per Week: 7 days    Minutes of Exercise per Session: 20 min  Stress: No Stress Concern Present (05/14/2022)   [place]    Feeling of Stress : Not at all  Social Connections: Not on file    Tobacco Counseling Counseling given: Not Answered   Clinical Intake:  Pre-visit preparation completed: Yes  Pain : No/denies pain     Nutritional Status: BMI > 30  Obese Nutritional Risks: None Diabetes: No  How often do you need to have someone help you when you read instructions, pamphlets, or other written materials from your doctor or pharmacy?: 1 - Never  Diabetic? no  Interpreter Needed?: No  Information entered by :: NAllen LPN   Activities of Daily Living    05/14/2022   11:18 AM  In your present state of health, do you have any difficulty performing the following activities:  Hearing? 0  Vision? 0  Difficulty concentrating or making decisions? 0  Walking or climbing stairs? 1  Dressing or bathing? 0  Doing errands, shopping? 0  Preparing Food and eating ? N  Using the Toilet? N  In the past six months, have you accidently leaked urine? N  Do you have problems with loss of bowel control? N  Managing your Medications? N   Managing your Finances? N  Housekeeping or managing your Housekeeping? N    Patient Care Team: Glendale Chard, MD as PCP - General (Internal Medicine) Rutherford Guys, MD as Consulting Physician (Ophthalmology) Mayford Knife, [facility] (Pharmacist)  Indicate any recent Medical Services you may have received from other than [facility] providers in the past year (date may be approximate).     Assessment:   This is a routine wellness examination for [place].  Hearing/Vision screen Vision Screening - Comments:: Regular eye exams, Dr. Gershon Crane  Dietary issues and exercise activities discussed: Current Exercise Habits: Home exercise routine, Type of exercise: stretching, Time (Minutes): 20, Frequency (Times/Week): 7, Weekly Exercise (Minutes/Week): 140   Goals Addressed             This Visit's Progress    Patient Stated       05/14/2022, no goals       Depression Screen    05/14/2022   11:18 AM 04/23/2021   10:12 AM 04/18/2020   11:14 AM 10/04/2019    2:58 PM 04/12/2019    9:30 AM 11/21/2018    2:52 PM 10/18/2018   10:05 AM  PHQ 2/9 Scores  PHQ - 2 Score 0 0 1 0 0 0 0  PHQ- 9 Score   1  0      Fall Risk    05/14/2022   11:17 AM 04/23/2021   10:11 AM 04/18/2020   11:13 AM 10/04/2019    2:58 PM 04/12/2019    9:30 AM  [place] in the past year? 1 1 0 0 0  Comment tripped hit corner of a wall     Number falls in past yr: 0 0  0 0  Injury with Fall? 0 1  0  Comment  cut head     Risk for fall due to : Medication side effect Medication side effect Medication side effect  Medication side effect  Follow up Falls evaluation completed;Education provided;Falls prevention discussed Falls evaluation completed;Education provided;Falls prevention discussed Falls evaluation completed;Education provided;Falls prevention discussed  Falls evaluation completed;Education provided;Falls prevention discussed    FALL RISK PREVENTION PERTAINING TO THE HOME:  Any stairs in or around the  home? No  If so, are there any without handrails? N/a Home free of loose throw rugs in walkways, pet beds, electrical cords, etc? Yes  Adequate lighting in your home to reduce risk of falls? Yes   ASSISTIVE DEVICES UTILIZED TO PREVENT FALLS:  Life alert? No  Use of a cane, walker or w/c? Yes  Grab bars in the bathroom? Yes  Shower chair or bench in shower? Yes  Elevated toilet seat or a handicapped toilet? Yes   TIMED UP AND GO:  Was the test performed? Yes .  Length of time to ambulate 10 feet: 7 sec.   Gait slow and steady with assistive device  Cognitive Function:        05/14/2022   11:19 AM 04/23/2021   10:14 AM 04/18/2020   11:17 AM 04/12/2019    9:33 AM 04/06/2018    9:29 AM  6CIT Screen  What Year? 0 points 0 points 0 points 0 points 0 points  What month? 0 points 0 points 0 points 0 points 0 points  What time? 0 points 0 points 0 points 0 points 0 points  Count back from 20 0 points 0 points 0 points 0 points 0 points  Months in reverse 4 points 2 points 2 points 2 points 2 points  Repeat phrase 4 points 4 points 4 points 2 points 0 points  Total Score 8 points 6 points 6 points 4 points 2 points    Immunizations Immunization History  Administered Date(s) Administered   Moderna Sars-Covid-2 Vaccination 07/23/2019, 08/21/2019   PNEUMOCOCCAL CONJUGATE-20 10/27/2021   Pneumococcal Polysaccharide-23 04/18/2020   Tdap 05/14/2022    TDAP status: Completed at today's visit  Flu Vaccine status: Declined, Education has been provided regarding the importance of this vaccine but patient still declined. Advised may receive this vaccine at local pharmacy or Health Dept. Aware to provide a copy of the vaccination record if obtained from local pharmacy or Health Dept. Verbalized acceptance and understanding.  Pneumococcal vaccine status: Up to date  Covid-19 vaccine status: Completed vaccines  Qualifies for Shingles Vaccine? Yes   Zostavax completed No   Shingrix  Completed?: No.    Education has been provided regarding the importance of this vaccine. Patient has been advised to call insurance company to determine out of pocket expense if they have not yet received this vaccine. Advised may also receive vaccine at local pharmacy or Health Dept. Verbalized acceptance and understanding.  Screening Tests Health Maintenance  Topic Date Due   COVID-19 Vaccine (3 - Moderna risk series) 09/18/2019   Zoster Vaccines- Shingrix (1 of 2) 08/13/2022 (Originally 02/03/1949)   INFLUENZA VACCINE  09/06/2022 (Originally 01/06/2022)   MAMMOGRAM  04/18/2023   Medicare Annual Wellness (AWV)  05/15/2023   DTaP/Tdap/Td (2 - Td or Tdap) 05/14/2032   Pneumonia Vaccine 73+ Years old  Completed   DEXA SCAN  Completed   HPV VACCINES  Aged Out    Health Maintenance  Health Maintenance Due  Topic Date Due   COVID-19 Vaccine (3 - Moderna risk series) 09/18/2019  Colorectal cancer screening: No longer required.   Mammogram status: Completed 04/17/2022. Repeat every year  Bone Density status: Completed 01/17/2018.  Lung Cancer Screening: (Low Dose CT Chest recommended if Age 68-80 years, 30 pack-year currently smoking OR have quit w/in 15years.) does not qualify.   Lung Cancer Screening Referral: no  Additional Screening:  Hepatitis C Screening: does not qualify;   Vision Screening: Recommended annual ophthalmology exams for early detection of glaucoma and other disorders of the eye. Is the patient up to date with their annual eye exam?  Yes  Who is the provider or what is the name of the office in which the patient attends annual eye exams? Dr. Gershon Crane If pt is not established with a provider, would they like to be referred to a provider to establish care? No .   Dental Screening: Recommended annual dental exams for proper oral hygiene  Community Resource Referral / Chronic Care Management: CRR required this visit?  No   CCM required this visit?  No       Plan:     I have personally reviewed and noted the following in the patient's chart:   Medical and social history Use of alcohol, tobacco or illicit drugs  Current medications and supplements including opioid prescriptions. Patient is not currently taking opioid prescriptions. Functional ability and status Nutritional status Physical activity Advanced directives List of other physicians Hospitalizations, surgeries, and ER visits in previous 12 months Vitals Screenings to include cognitive, depression, and falls Referrals and appointments  In addition, I have reviewed and discussed with patient certain preventive protocols, quality metrics, and best practice recommendations. A written personalized care plan for preventive services as well as general preventive health recommendations were provided to patient.     Kellie Simmering, LPN   29/02/3715   Nurse Notes: none

## 2022-05-14 NOTE — Progress Notes (Signed)
Rich Brave [place],acting as a Education administrator for [name], MD.,have documented all relevant documentation on the behalf of [name], MD,as directed by  [name], MD while in the presence of [name], MD.    Subjective:     Patient ID: Paula Pollard , female    DOB: 07-Sep-1929 , 88 y.o.   MRN: [id number]   Chief Complaint  Patient presents with   Hypertension    HPI  She is here today for BP check. She reports compliance with meds. She denies headaches, chest pain and shortness of breath. She feels well, has no specific concerns or complaints at this time.   She is also scheduled for AWV with [facility] Advisor.   Hypertension This is a chronic problem. The current episode started more than 1 year ago. The problem has been gradually improving since onset. The problem is controlled. Pertinent negatives include no blurred vision. Risk factors for coronary artery disease include sedentary lifestyle, obesity, stress and post-menopausal state. There are no compliance problems.  Hypertensive end-organ damage includes kidney disease.     Past Medical History:  Diagnosis Date   Anemia    "in past as a young girl"   Anxiety    Arthritis    Asthma    Bronchitis    hx of   Cataract    bilaterally, "no surgery at this time"   Diabetes mellitus without complication ([place])    pre diabetic   Dysrhythmia    when gets excited   GERD (gastroesophageal reflux disease)    Headache(784.0)    hx of migraines   History of hiatal hernia    Hyperlipidemia    Hypertension    sees Dr. Terrence Dupont   Neuromuscular disorder [facility])    hx of carpal tunnel "never had surgery for"right hand     Family History  Problem Relation Age of Onset   Heart attack Mother    Heart attack Father    Stroke Father    Stroke Daughter    Colon cancer Sister      Current Outpatient Medications:    acetaminophen (TYLENOL) 500 MG tablet, Take 500 mg by mouth every 6 (six) hours as needed for  moderate pain or headache., Disp: , Rfl:    albuterol (VENTOLIN HFA) 108 (90 Base) MCG/ACT inhaler, INHALE 2 PUFFS INTO THE LUNGS EVERY 4 HOURS AS NEEDED FOR WHEEZING OR SHORTNESS OF BREATH., Disp: 18 each, Rfl: 1   Ascorbic Acid (VITAMIN C PO), Take 1 tablet by mouth daily., Disp: , Rfl:    aspirin EC 81 MG tablet, Take 81 mg by mouth daily., Disp: , Rfl:    atorvastatin (LIPITOR) 40 MG tablet, Take 40 mg by mouth daily., Disp: , Rfl:    azelastine (ASTELIN) 0.1 % nasal spray, Place 1 spray into both nostrils daily. Use in each nostril as directed, Disp: 30 mL, Rfl: 5   cholecalciferol (VITAMIN D) 400 units TABS tablet, Take 600 Units by mouth daily., Disp: , Rfl:    Cyanocobalamin (B-12 PO), Take 500 mcg by mouth daily., Disp: , Rfl:    esomeprazole (NEXIUM) 20 MG capsule, Take 20 mg by mouth as needed (for heartburn)., Disp: , Rfl:    fluticasone (FLONASE) 50 MCG/ACT nasal spray, PLACE 2 SPRAYS INTO BOTH NOSTRILS DAILY AS NEEDED FOR ALLERGIES OR RHINITIS., Disp: 48 mL, Rfl: 0   fluticasone-salmeterol (ADVAIR HFA) 115-21 MCG/ACT inhaler, Inhale 2 puffs into the lungs 2 (two) times daily. (Patient not  taking: Reported on 05/14/2022), Disp: 1 each, Rfl: 5   hydrochlorothiazide (HYDRODIURIL) 12.5 MG tablet, TAKE 1 TABLET BY MOUTH EVERY DAY, Disp: 90 tablet, Rfl: 1   meclizine (ANTIVERT) 25 MG tablet, Take 12.5 mg by mouth 3 (three) times daily as needed for dizziness., Disp: , Rfl:    metoprolol succinate (TOPROL-XL) 50 MG 24 hr tablet, Take 50 mg by mouth daily., Disp: , Rfl:    Multiple Vitamins-Minerals (ECHINACEA ACZ PO), Take 1 capsule by mouth as needed (for immune support)., Disp: , Rfl:    nitroGLYCERIN (NITROSTAT) 0.4 MG SL tablet, Place 0.4 mg under the tongue every 5 (five) minutes as needed for chest pain., Disp: , Rfl:    Polyethyl Glycol-Propyl Glycol 0.4-0.3 % SOLN, Apply 1 drop to eye 2 (two) times a day. systane, Disp: , Rfl:    telmisartan (MICARDIS) 20 MG tablet, TAKE 1 TABLET BY  MOUTH EVERY DAY, Disp: 90 tablet, Rfl: 1   VENTOLIN HFA 108 (90 Base) MCG/ACT inhaler, TAKE 2 PUFFS BY MOUTH EVERY 6 HOURS AS NEEDED FOR WHEEZE OR SHORTNESS OF BREATH, Disp: 18 each, Rfl: 1   VENTOLIN HFA 108 (90 Base) MCG/ACT inhaler, Inhale 2 puffs into the lungs every 4 (four) hours as needed for wheezing or shortness of breath., Disp: 18 g, Rfl: 1   vitamin E 400 UNIT capsule, Take 400 Units by mouth daily., Disp: , Rfl:    Allergies  Allergen Reactions   Codeine Nausea Only and Other (See Comments)    Reaction:Dizziness and hallucinations "makes me climb walls"   Doxycycline Other (See Comments)     Review of Systems  Constitutional: Negative.   Eyes: Negative.  Negative for blurred vision.  Respiratory: Negative.    Cardiovascular: Negative.   Musculoskeletal: Negative.   Skin: Negative.   Neurological: Negative.   Psychiatric/Behavioral: Negative.       Today's Vitals   05/14/22 1129  BP: 130/82  Pulse: 88  Temp: 98.5 F (36.9 C)  Weight: 177 lb 11.1 oz (80.6 kg)  Height: 5' 1.4" (1.56 m)   Body mass index is 33.14 kg/m.  Wt Readings from Last 3 Encounters:  05/14/22 177 lb 11.1 oz (80.6 kg)  05/14/22 177 lb 12.8 oz (80.6 kg)  10/27/21 174 lb 12.8 oz (79.3 kg)    Objective:  Physical Exam Vitals and nursing note reviewed.  Constitutional:      Appearance: Normal appearance.  HENT:     Head: Normocephalic and atraumatic.     Nose:     Comments: Masked     Mouth/Throat:     Comments: Masked  Eyes:     Extraocular Movements: Extraocular movements intact.  Cardiovascular:     Rate and Rhythm: Normal rate and regular rhythm.     Heart sounds: Normal heart sounds.  Pulmonary:     Effort: Pulmonary effort is normal.     Breath sounds: Normal breath sounds.  Musculoskeletal:     Cervical back: Normal range of motion.  Skin:    General: Skin is warm.  Neurological:     General: No focal deficit present.     Mental Status: She is alert.  Psychiatric:         Mood and Affect: Mood normal.        Behavior: Behavior normal.       Assessment And Plan:     1. Hypertensive nephropathy Comments: Chronic, controlled.  She will c/w telmisartan 83m, hctz 12.558mand metoprolol daily. Reminded to follow  a low sodium diet. - CMP14+EGFR  2. Chronic renal disease, stage II Comments: Chronic, she is reminded to avoid NSAIDS, stay hydrated and keep Bp well controlled to decrease risk of CKD progression. - Microalbumin / Creatinine Urine Ratio  3. Estrogen deficiency Comments: I will schedule her for bone density at [facility]., she is in agreement with treatment plan. - DG Bone Density; Future  4. Vitamin D deficiency disease Comments: I will check vitamin D level and supplement as needed. - Vitamin D (25 hydroxy)  5. Other abnormal glucose Comments: Her a1c has been elevated in the past. I will recheck this today. She is encouraged to limit her intake of sugary beverages and foods. - CMP14+EGFR - Hemoglobin A1c  6. Class 1 obesity due to excess calories with serious comorbidity and body mass index (BMI) of 33.0 to 33.9 in adult Comments: She is encouraged to gradually increase her daily activity, aiming for 30 minutes five days per week.  7. Immunization due Comments: She was given Tdap, billed via [facility]. - Tdap vaccine greater than or equal to 7yo IM     Patient was given opportunity to ask questions. Patient verbalized understanding of the plan and was able to repeat key elements of the plan. All questions were answered to their satisfaction.   I, [name], MD, have reviewed all documentation for this visit. The documentation on 05/14/22 for the exam, diagnosis, procedures, and orders are all accurate and complete.   IF YOU HAVE BEEN REFERRED TO A SPECIALIST, IT MAY TAKE 1-2 WEEKS TO SCHEDULE/PROCESS THE REFERRAL. IF YOU HAVE NOT HEARD FROM US/SPECIALIST IN TWO WEEKS, PLEASE GIVE [place] A CALL AT [phone].   THE PATIENT IS  ENCOURAGED TO PRACTICE SOCIAL DISTANCING DUE TO THE COVID-19 PANDEMIC.

## 2022-05-14 NOTE — Patient Instructions (Signed)
Paula Pollard , Thank you for taking time to come for your Medicare Wellness Visit. I appreciate your ongoing commitment to your health goals. Please review the following plan we discussed and let me know if I can assist you in the future.   These are the goals we discussed:  Goals      Manage My Medicine     Timeframe:  Long-Range Goal Priority:  High Start Date:                             Expected End Date:                       Follow Up Date 10/25/2021   In Progess:   - call if I am sick and can't take my medicine - keep a list of all the medicines I take; vitamins and herbals too - use an alarm clock or phone to remind me to take my medicine    Why is this important?   These steps will help you keep on track with your medicines.   Notes:  -Will be in contact with you to help with your patient assistance application for Advair HFA     Patient Stated     04/12/2019, wants to work on strengthen back     Patient Stated     04/18/2020, wants to stay well     Patient Stated     04/22/2021, remain independent     Track and Manage My Symptoms-Asthma     Timeframe:  Long-Range Goal Priority:  High Start Date:                             Expected End Date:                       Follow Up Date 03/17/2022   - avoid symptom triggers outdoors - eliminate symptom triggers at home - keep follow-up appointments - keep rescue medicines on hand    Why is this important?   Keeping track of asthma symptoms can tell you a lot about your asthma control.  Based on symptoms and peak flow results you can see how well you are doing.  Your asthma action plan has a green, yellow and red zone. Green means all is good; it is your goal. Yellow means your symptoms are a little worse. You will need to adjust your medications. Being in the red zone means that your   asthma is out of control. You will need to use your rescue medicines. You may need emergency care.     Notes:  -Thank you for  talking with me today. Please let me know if you have any questions.      Weight (lb) < 200 lb (90.7 kg)     Patient would like to remain healthy        This is a list of the screening recommended for you and due dates:  Health Maintenance  Topic Date Due   DTaP/Tdap/Td vaccine (1 - Tdap) Never done   Zoster (Shingles) Vaccine (1 of 2) Never done   COVID-19 Vaccine (3 - Moderna risk series) 09/18/2019   Flu Shot  Never done   Medicare Annual Wellness Visit  04/23/2022   Mammogram  04/18/2023   Pneumonia Vaccine  Completed   DEXA scan (bone density measurement)  Completed   HPV Vaccine  Aged Out    Advanced directives: Please bring a copy of your POA (Power of [place]) and/or Living Will to your next appointment.   Conditions/risks identified: none  Next appointment: Follow up in one year for your annual wellness visit    Preventive Care 65 Years and Older, Female Preventive care refers to lifestyle choices and visits with your health care provider that can promote health and wellness. What does preventive care include? A yearly physical exam. This is also called an annual well check. Dental exams once or twice a year. Routine eye exams. Ask your health care provider how often you should have your eyes checked. Personal lifestyle choices, including: Daily care of your teeth and gums. Regular physical activity. Eating a healthy diet. Avoiding tobacco and drug use. Limiting alcohol use. Practicing safe sex. Taking low-dose aspirin every day. Taking vitamin and mineral supplements as recommended by your health care provider. What happens during an annual well check? The services and screenings done by your health care provider during your annual well check will depend on your age, overall health, lifestyle risk factors, and family history of disease. Counseling  Your health care provider may ask you questions about your: Alcohol use. Tobacco use. Drug use. Emotional  well-being. Home and relationship well-being. Sexual activity. Eating habits. History of falls. Memory and ability to understand (cognition). Work and work Statistician. Reproductive health. Screening  You may have the following tests or measurements: Height, weight, and BMI. Blood pressure. Lipid and cholesterol levels. These may be checked every 5 years, or more frequently if you are over 63 years old. Skin check. Lung cancer screening. You may have this screening every year starting at age 36 if you have a 30-pack-year history of smoking and currently smoke or have quit within the past 15 years. Fecal occult blood test (FOBT) of the stool. You may have this test every year starting at age 11. Flexible sigmoidoscopy or colonoscopy. You may have a sigmoidoscopy every 5 years or a colonoscopy every 10 years starting at age 52. Hepatitis C blood test. Hepatitis B blood test. Sexually transmitted disease (STD) testing. Diabetes screening. This is done by checking your blood sugar (glucose) after you have not eaten for a while (fasting). You may have this done every 1-3 years. Bone density scan. This is done to screen for osteoporosis. You may have this done starting at age 67. Mammogram. This may be done every 1-2 years. Talk to your health care provider about how often you should have regular mammograms. Talk with your health care provider about your test results, treatment options, and if necessary, the need for more tests. Vaccines  Your health care provider may recommend certain vaccines, such as: Influenza vaccine. This is recommended every year. Tetanus, diphtheria, and acellular pertussis (Tdap, Td) vaccine. You may need a Td booster every 10 years. Zoster vaccine. You may need this after age 45. Pneumococcal 13-valent conjugate (PCV13) vaccine. One dose is recommended after age 80. Pneumococcal polysaccharide (PPSV23) vaccine. One dose is recommended after age 74. Talk to your  health care provider about which screenings and vaccines you need and how often you need them. This information is not intended to replace advice given to you by your health care provider. Make sure you discuss any questions you have with your health care provider. Document Released: 06/21/2015 Document Revised: 02/12/2016 Document Reviewed: 03/26/2015 Elsevier Interactive Patient Education  2017 [place] Prevention in the [facility]  can cause injuries. They can happen to people of all ages. There are many things you can do to make your home safe and to help prevent falls. What can I do on the outside of my home? Regularly fix the edges of walkways and driveways and fix any cracks. Remove anything that might make you trip as you walk through a door, such as a raised step or threshold. Trim any bushes or trees on the path to your home. Use bright outdoor lighting. Clear any walking paths of anything that might make someone trip, such as rocks or tools. Regularly check to see if handrails are loose or broken. Make sure that both sides of any steps have handrails. Any raised decks and porches should have guardrails on the edges. Have any leaves, snow, or ice cleared regularly. Use sand or salt on walking paths during winter. Clean up any spills in your garage right away. This includes oil or grease spills. What can I do in the bathroom? Use night lights. Install grab bars by the toilet and in the tub and shower. Do not use towel bars as grab bars. Use non-skid mats or decals in the tub or shower. If you need to sit down in the shower, use a plastic, non-slip stool. Keep the floor dry. Clean up any water that spills on the floor as soon as it happens. Remove soap buildup in the tub or shower regularly. Attach bath mats securely with double-sided non-slip rug tape. Do not have throw rugs and other things on the floor that can make you trip. What can I do in the bedroom? Use night  lights. Make sure that you have a light by your bed that is easy to reach. Do not use any sheets or blankets that are too big for your bed. They should not hang down onto the floor. Have a firm chair that has side arms. You can use this for support while you get dressed. Do not have throw rugs and other things on the floor that can make you trip. What can I do in the kitchen? Clean up any spills right away. Avoid walking on wet floors. Keep items that you use a lot in easy-to-reach places. If you need to reach something above you, use a strong step stool that has a grab bar. Keep electrical cords out of the way. Do not use floor polish or wax that makes floors slippery. If you must use wax, use non-skid floor wax. Do not have throw rugs and other things on the floor that can make you trip. What can I do with my stairs? Do not leave any items on the stairs. Make sure that there are handrails on both sides of the stairs and use them. Fix handrails that are broken or loose. Make sure that handrails are as long as the stairways. Check any carpeting to make sure that it is firmly attached to the stairs. Fix any carpet that is loose or worn. Avoid having throw rugs at the top or bottom of the stairs. If you do have throw rugs, attach them to the floor with carpet tape. Make sure that you have a light switch at the top of the stairs and the bottom of the stairs. If you do not have them, ask someone to add them for you. What else can I do to help prevent falls? Wear shoes that: Do not have high heels. Have rubber bottoms. Are comfortable and fit you well. Are closed at the toe.  Do not wear sandals. If you use a stepladder: Make sure that it is fully opened. Do not climb a closed stepladder. Make sure that both sides of the stepladder are locked into place. Ask someone to hold it for you, if possible. Clearly mark and make sure that you can see: Any grab bars or handrails. First and last  steps. Where the edge of each step is. Use tools that help you move around (mobility aids) if they are needed. These include: Canes. Walkers. Scooters. Crutches. Turn on the lights when you go into a dark area. Replace any light bulbs as soon as they burn out. Set up your furniture so you have a clear path. Avoid moving your furniture around. If any of your floors are uneven, fix them. If there are any pets around you, be aware of where they are. Review your medicines with your doctor. Some medicines can make you feel dizzy. This can increase your chance of falling. Ask your doctor what other things that you can do to help prevent falls. This information is not intended to replace advice given to you by your health care provider. Make sure you discuss any questions you have with your health care provider. Document Released: 03/21/2009 Document Revised: 10/31/2015 Document Reviewed: 06/29/2014 Elsevier Interactive Patient Education  2017 [employer].

## 2022-05-14 NOTE — Patient Instructions (Signed)
Hypertension, Adult ?Hypertension is another name for high blood pressure. High blood pressure forces your heart to work harder to pump blood. This can cause problems over time. ?There are two numbers in a blood pressure reading. There is a top number (systolic) over a bottom number (diastolic). It is best to have a blood pressure that is below 120/80. ?What are the causes? ?The cause of this condition is not known. Some other conditions can lead to high blood pressure. ?What increases the risk? ?Some lifestyle factors can make you more likely to develop high blood pressure: ?Smoking. ?Not getting enough exercise or physical activity. ?Being overweight. ?Having too much fat, sugar, calories, or salt (sodium) in your diet. ?Drinking too much alcohol. ?Other risk factors include: ?Having any of these conditions: ?Heart disease. ?Diabetes. ?High cholesterol. ?Kidney disease. ?Obstructive sleep apnea. ?Having a family history of high blood pressure and high cholesterol. ?Age. The risk increases with age. ?Stress. ?What are the signs or symptoms? ?High blood pressure may not cause symptoms. Very high blood pressure (hypertensive crisis) may cause: ?Headache. ?Fast or uneven heartbeats (palpitations). ?Shortness of breath. ?Nosebleed. ?Vomiting or feeling like you may vomit (nauseous). ?Changes in how you see. ?Very bad chest pain. ?Feeling dizzy. ?Seizures. ?How is this treated? ?This condition is treated by making healthy lifestyle changes, such as: ?Eating healthy foods. ?Exercising more. ?Drinking less alcohol. ?Your doctor may prescribe medicine if lifestyle changes do not help enough and if: ?Your top number is above 130. ?Your bottom number is above 80. ?Your personal target blood pressure may vary. ?Follow these instructions at home: ?Eating and drinking ? ?If told, follow the DASH eating plan. To follow this plan: ?Fill one half of your plate at each meal with fruits and vegetables. ?Fill one fourth of your plate  at each meal with whole grains. Whole grains include whole-wheat pasta, brown rice, and whole-grain bread. ?Eat or drink low-fat dairy products, such as skim milk or low-fat yogurt. ?Fill one fourth of your plate at each meal with low-fat (lean) proteins. Low-fat proteins include fish, chicken without skin, eggs, beans, and tofu. ?Avoid fatty meat, cured and processed meat, or chicken with skin. ?Avoid pre-made or processed food. ?Limit the amount of salt in your diet to less than 1,500 mg each day. ?Do not drink alcohol if: ?Your doctor tells you not to drink. ?You are pregnant, may be pregnant, or are planning to become pregnant. ?If you drink alcohol: ?Limit how much you have to: ?0-1 drink a day for women. ?0-2 drinks a day for men. ?Know how much alcohol is in your drink. In the [place] one drink equals one 12 oz bottle of beer (355 mL), one 5 oz glass of wine (148 mL), or one 1? oz glass of hard liquor (44 mL). ?Lifestyle ? ?Work with your doctor to stay at a healthy weight or to lose weight. Ask your doctor what the best weight is for you. ?Get at least 30 minutes of exercise that causes your heart to beat faster (aerobic exercise) most days of the week. This may include walking, swimming, or biking. ?Get at least 30 minutes of exercise that strengthens your muscles (resistance exercise) at least 3 days a week. This may include lifting weights or doing Pilates. ?Do not smoke or use any products that contain nicotine or tobacco. If you need help quitting, ask your doctor. ?Check your blood pressure at home as told by your doctor. ?Keep all follow-up visits. ?Medicines ?Take over-the-counter and prescription medicines   only as told by your doctor. Follow directions carefully. ?Do not skip doses of blood pressure medicine. The medicine does not work as well if you skip doses. Skipping doses also puts you at risk for problems. ?Ask your doctor about side effects or reactions to medicines that you should watch  for. ?Contact a doctor if: ?You think you are having a reaction to the medicine you are taking. ?You have headaches that keep coming back. ?You feel dizzy. ?You have swelling in your ankles. ?You have trouble with your vision. ?Get help right away if: ?You get a very bad headache. ?You start to feel mixed up (confused). ?You feel weak or numb. ?You feel faint. ?You have very bad pain in your: ?Chest. ?Belly (abdomen). ?You vomit more than once. ?You have trouble breathing. ?These symptoms may be an emergency. Get help right away. Call 911. ?Do not wait to see if the symptoms will go away. ?Do not drive yourself to the hospital. ?Summary ?Hypertension is another name for high blood pressure. ?High blood pressure forces your heart to work harder to pump blood. ?For most people, a normal blood pressure is less than 120/80. ?Making healthy choices can help lower blood pressure. If your blood pressure does not get lower with healthy choices, you may need to take medicine. ?This information is not intended to replace advice given to you by your health care provider. Make sure you discuss any questions you have with your health care provider. ?Document Revised: 03/13/2021 Document Reviewed: 03/13/2021 ?Elsevier Patient Education ? 2023 [employer] ? ?

## 2022-05-16 LAB — CMP14+EGFR
ALT: 17 IU/L (ref 0–32)
AST: 24 IU/L (ref 0–40)
Albumin/Globulin Ratio: 1.5 (ref 1.2–2.2)
Albumin: 4.4 g/dL (ref 3.6–4.6)
Alkaline Phosphatase: 77 IU/L (ref 44–121)
BUN/Creatinine Ratio: 11 — ABNORMAL LOW (ref 12–28)
BUN: 8 mg/dL — ABNORMAL LOW (ref 10–36)
Bilirubin Total: 0.5 mg/dL (ref 0.0–1.2)
CO2: 27 mmol/L (ref 20–29)
Calcium: 9.4 mg/dL (ref 8.7–10.3)
Chloride: 99 mmol/L (ref 96–106)
Creatinine, Ser: 0.72 mg/dL (ref 0.57–1.00)
Globulin, Total: 3 g/dL (ref 1.5–4.5)
Glucose: 91 mg/dL (ref 70–99)
Potassium: 4 mmol/L (ref 3.5–5.2)
Sodium: 138 mmol/L (ref 134–144)
Total Protein: 7.4 g/dL (ref 6.0–8.5)
eGFR: 79 mL/min/{1.73_m2} (ref >59)

## 2022-05-16 LAB — VITAMIN D 25 HYDROXY (VIT D DEFICIENCY, FRACTURES): Vit D, 25-Hydroxy: 56.9 ng/mL (ref 30.0–100.0)

## 2022-05-16 LAB — HEMOGLOBIN A1C
Est. average glucose Bld gHb Est-mCnc: 126 mg/dL
Hgb A1c MFr Bld: 6 % — ABNORMAL HIGH (ref 4.8–5.6)

## 2022-05-16 LAB — MICROALBUMIN / CREATININE URINE RATIO
Creatinine, Urine: 23.8 mg/dL
Microalb/Creat Ratio: <13 mg/g creat (ref 0–29)
Microalbumin, Urine: <3 ug/mL

## 2022-05-19 ENCOUNTER — Encounter: Payer: Self-pay | Admitting: Allergy & Immunology

## 2022-05-19 ENCOUNTER — Ambulatory Visit (INDEPENDENT_AMBULATORY_CARE_PROVIDER_SITE_OTHER): Payer: Medicare Other | Admitting: Allergy & Immunology

## 2022-05-19 ENCOUNTER — Other Ambulatory Visit: Payer: Self-pay

## 2022-05-19 VITALS — BP 134/82 | HR 95 | Temp 98.3°F | Resp 16 | Ht 61.5 in | Wt 176.4 lb

## 2022-05-19 DIAGNOSIS — J3089 Other allergic rhinitis: Secondary | ICD-10-CM | POA: Diagnosis not present

## 2022-05-19 DIAGNOSIS — I519 Heart disease, unspecified: Secondary | ICD-10-CM | POA: Diagnosis not present

## 2022-05-19 DIAGNOSIS — R42 Dizziness and giddiness: Secondary | ICD-10-CM

## 2022-05-19 DIAGNOSIS — R0789 Other chest pain: Secondary | ICD-10-CM | POA: Diagnosis not present

## 2022-05-19 DIAGNOSIS — I38 Endocarditis, valve unspecified: Secondary | ICD-10-CM | POA: Diagnosis not present

## 2022-05-19 DIAGNOSIS — J4540 Moderate persistent asthma, uncomplicated: Secondary | ICD-10-CM

## 2022-05-19 DIAGNOSIS — I2510 Atherosclerotic heart disease of native coronary artery without angina pectoris: Secondary | ICD-10-CM | POA: Diagnosis not present

## 2022-05-19 MED ORDER — ALBUTEROL SULFATE HFA 108 (90 BASE) MCG/ACT IN AERS: 2.0000 | INHALATION_SPRAY | RESPIRATORY_TRACT | 1 refills | Status: DC | PRN

## 2022-05-19 NOTE — Progress Notes (Signed)
FOLLOW UP  Date of Service/Encounter:  05/19/22   Assessment:   Mild intermittent asthma without complication   Allergic rhinitis   Vertigo   She is using her rescue inhaler a little more than I would like, but it seems to be more related to the cost of her controller medication which is prohibitive.  I think twice a day albuterol is not that big of a deal, so I do not think we need to do anything differently at this point in time.  She is doing remarkably well for a 88 year old woman.  Plan/Recommendations:   1. Mild intermittent asthma without complication - Lung testing looked stable today. - We are not going to make any medication changes at this time. - I think you know your body and you have a good handle on your symptoms!  - Daily controller medication(s): Advair 115/33mg two puffs 1-2 times daily (when you can afford it) - Prior to physical activity: Ventolin 2 puffs 10-15 minutes before physical activity. - Rescue medications: Ventolin 4 puffs every 4-6 hours as needed - Changes during respiratory infections or worsening symptoms: Increase Advair 2 puffs twice daily for ONE TO TWO WEEKS. - Asthma control goals:  * Full participation in all desired activities (may need albuterol before activity) * Albuterol use two time or less a week on average (not counting use with activity) * Cough interfering with sleep two time or less a month * Oral steroids no more than once a year * No hospitalizations  2. Allergic rhinitis - Continue with Dymista 1-2 sprays per nostril twice daily.  - Continue with Pataday one drop per eye twice daily.  - You can use more of your nose spray when you have a lot of phlegm in your throat.   3. Vertigo - Continue to follow with Dr. TBenjamine Molaas needed.   4. Return in about 6 months (around 11/18/2022).   Subjective:   Paula BARCELLOSis a 88y.o. female presenting today for follow up of  Chief Complaint  Patient presents with   Follow-up     Paula DAYLEYhas a history of the following: Patient Active Problem List   Diagnosis Date Noted   Hypertensive nephropathy 04/23/2021   Chronic renal disease, stage II 04/23/2021   Hair loss 04/23/2021   Other abnormal glucose 04/23/2021   Mild intermittent asthma 06/26/2019   Vertigo 10/28/2018   Hypokalemia 10/28/2018   Acute sinusitis 10/05/2017   Other allergic rhinitis 10/05/2017   Asthma with acute exacerbation 03/06/2015   Unexplained night sweats 03/06/2015   Cough 02/16/2015   Essential hypertension, benign 08/25/2012   Other and unspecified hyperlipidemia 08/25/2012   Benign paroxysmal positional vertigo 08/25/2012   Unspecified constipation 08/25/2012   Generalized anxiety disorder 08/25/2012   GERD (gastroesophageal reflux disease) 08/25/2012   Osteoarthritis of right hip 08/10/2012    History obtained from: chart review and patient.  MRailynis a 88y.o. female presenting for a follow up visit.  She was last seen in May 2023.  At that time, lung testing looks stable.  We continue with Advair 115 2 puffs twice daily.  For her allergic rhinitis, we will continue with Dymista as well as Pataday.  She continue to follow with Dr. TBenjamine Molafor her vertigo.  Since last visit, she has done very well.  Asthma/Respiratory Symptom History: She remains on the  Advair intermittently. It is too expensive to use daily. She has the Ventolin one puff twice daily. She reports  that it helps her to breathe well. She has not been on prednisone at all of her symptoms.  Allergic Rhinitis Symptom History: She is using her nose sprays which are helping. She uses the fluticasone and azelastine.  When she uses it twice a day, she tends to get nosebleeds, so she backs off is much as she can.  She has not been on antibiotics at all since last visit.  She feels that she is overall doing very well.  Otherwise, there have been no changes to her past medical history, surgical history, family  history, or social history.    Review of Systems  Constitutional: Negative.  Negative for chills, fever, malaise/fatigue and weight loss.  HENT:  Negative for congestion, ear discharge, ear pain and sinus pain.   Eyes:  Negative for pain, discharge and redness.  Respiratory:  Negative for cough, sputum production, shortness of breath and wheezing.   Cardiovascular: Negative.  Negative for chest pain and palpitations.  Gastrointestinal:  Negative for abdominal pain, constipation, diarrhea, heartburn, nausea and vomiting.  Skin: Negative.  Negative for itching and rash.  Neurological:  Negative for dizziness and headaches.  Endo/Heme/Allergies:  Negative for environmental allergies. Does not bruise/bleed easily.       Objective:   Blood pressure (!) 160/70, pulse 95, temperature 98.3 F (36.8 C), temperature source Temporal, resp. rate 16, height 5' 1.5" (1.562 m), weight 176 lb 6.4 oz (80 kg), SpO2 94 %. Body mass index is 32.79 kg/m.    Physical Exam Vitals reviewed.  Constitutional:      Appearance: She is well-developed.     Comments: Delightful female.  Very interactive. Very sweet!   HENT:     Head: Normocephalic and atraumatic.     Right Ear: Tympanic membrane, ear canal and external ear normal.     Left Ear: Tympanic membrane, ear canal and external ear normal.     Nose: No nasal deformity, septal deviation, mucosal edema or rhinorrhea.     Right Turbinates: Enlarged, swollen and pale.     Left Turbinates: Enlarged, swollen and pale.     Right Sinus: No maxillary sinus tenderness or frontal sinus tenderness.     Left Sinus: No maxillary sinus tenderness or frontal sinus tenderness.     Mouth/Throat:     Lips: Pink.     Mouth: Mucous membranes are moist. Mucous membranes are not pale and not dry.     Pharynx: Uvula midline.     Comments: Cobblestoning in the posterior oropharynx.  Eyes:     General: Lids are normal. No allergic shiner.       Right eye: No  discharge.        Left eye: No discharge.     Conjunctiva/sclera: Conjunctivae normal.     Right eye: Right conjunctiva is not injected. No chemosis.    Left eye: Left conjunctiva is not injected. No chemosis.    Pupils: Pupils are equal, round, and reactive to light.  Cardiovascular:     Rate and Rhythm: Normal rate and regular rhythm.     Heart sounds: Normal heart sounds.  Pulmonary:     Effort: Pulmonary effort is normal. No tachypnea, accessory muscle usage or respiratory distress.     Breath sounds: Normal breath sounds. No wheezing, rhonchi or rales.     Comments: Moving air well in all lung fields.  No wheezes noted. Chest:     Chest wall: No tenderness.  Lymphadenopathy:     Cervical: No cervical  adenopathy.  Skin:    Coloration: Skin is not pale.     Findings: No abrasion, erythema, petechiae or rash. Rash is not papular, urticarial or vesicular.  Neurological:     Mental Status: She is alert.  Psychiatric:        Behavior: Behavior is cooperative.      Diagnostic studies:    Spirometry: results normal (FEV1: 1.03/79%, FVC: 1.21/68%, FEV1/FVC: 85%).    Spirometry consistent with normal pattern.   Allergy Studies: none       Salvatore Marvel, MD  Allergy and [facility]

## 2022-05-19 NOTE — Patient Instructions (Addendum)
1. Mild intermittent asthma without complication - Lung testing looked stable today. - We are not going to make any medication changes at this time. - I think you know your body and you have a good handle on your symptoms!  - Daily controller medication(s): Advair 115/49mg two puffs 1-2 times daily (when you can afford it) - Prior to physical activity: Ventolin 2 puffs 10-15 minutes before physical activity. - Rescue medications: Ventolin 4 puffs every 4-6 hours as needed - Changes during respiratory infections or worsening symptoms: Increase Advair 2 puffs twice daily for ONE TO TWO WEEKS. - Asthma control goals:  * Full participation in all desired activities (may need albuterol before activity) * Albuterol use two time or less a week on average (not counting use with activity) * Cough interfering with sleep two time or less a month * Oral steroids no more than once a year * No hospitalizations  2. Allergic rhinitis - Continue with Dymista 1-2 sprays per nostril twice daily.  - Continue with Pataday one drop per eye twice daily.  - You can use more of your nose spray when you have a lot of phlegm in your throat.   3. Vertigo - Continue to follow with Dr. TBenjamine Molaas needed.   4. Return in about 6 months (around 11/18/2022).    Please inform [place]of any Emergency Department visits, hospitalizations, or changes in symptoms. Call u[place]abefore going to the ED for breathing or allergy symptoms since we might be able to fit you in for a sick visit. Feel free to contact u[place]anytime with any questions, problems, or concerns.  It was a pleasure to see you again today! THeuveltonfor being AMAZING!   Websites that have reliable patient information: 1. American Academy of Asthma, Allergy, and Immunology: www.aaaai.org 2. Food Allergy Research and Education (FARE): foodallergy.org 3. Mothers of Asthmatics: http://www.asthmacommunitynetwork.org 4. American College of Allergy, Asthma, and Immunology:  www.acaai.org    COVID-19 Vaccine Information can be found at: [email or web address] questions related to vaccine distribution or appointments, please email [email or web address] or call [phone]   We realize that you might be concerned about having an allergic reaction to the COVID19 vaccines. To help with that concern, WE ARE OFFERING THE COVID19 VACCINES IN OUR OFFICE! Ask the front desk for dates!     "Like" u[place]on Facebook and Instagram for our latest updates!      A healthy democracy works best when ANew York Life Insuranceparticipate! Make sure you are registered to vote! If you have moved or changed any of your contact information, you will need to get this updated before voting!  In some cases, you MAY be able to register to vote online: [email or web address]

## 2022-05-25 DIAGNOSIS — H25813 Combined forms of age-related cataract, bilateral: Secondary | ICD-10-CM | POA: Diagnosis not present

## 2022-05-25 DIAGNOSIS — H52203 Unspecified astigmatism, bilateral: Secondary | ICD-10-CM | POA: Diagnosis not present

## 2022-05-25 DIAGNOSIS — H524 Presbyopia: Secondary | ICD-10-CM | POA: Diagnosis not present

## 2022-05-27 DIAGNOSIS — M8589 Other specified disorders of bone density and structure, multiple sites: Secondary | ICD-10-CM | POA: Diagnosis not present

## 2022-05-27 DIAGNOSIS — Z780 Asymptomatic menopausal state: Secondary | ICD-10-CM | POA: Diagnosis not present

## 2022-05-27 LAB — HM DEXA SCAN

## 2022-06-19 ENCOUNTER — Other Ambulatory Visit: Payer: Self-pay | Admitting: Allergy & Immunology

## 2022-07-16 ENCOUNTER — Other Ambulatory Visit: Payer: Self-pay | Admitting: Allergy & Immunology

## 2022-07-22 DIAGNOSIS — R112 Nausea with vomiting, unspecified: Secondary | ICD-10-CM | POA: Diagnosis not present

## 2022-07-22 DIAGNOSIS — R197 Diarrhea, unspecified: Secondary | ICD-10-CM | POA: Diagnosis not present

## 2022-07-22 DIAGNOSIS — R1032 Left lower quadrant pain: Secondary | ICD-10-CM | POA: Diagnosis not present

## 2022-07-22 DIAGNOSIS — R1031 Right lower quadrant pain: Secondary | ICD-10-CM | POA: Diagnosis not present

## 2022-07-22 DIAGNOSIS — K5730 Diverticulosis of large intestine without perforation or abscess without bleeding: Secondary | ICD-10-CM | POA: Diagnosis not present

## 2022-07-22 DIAGNOSIS — R109 Unspecified abdominal pain: Secondary | ICD-10-CM | POA: Insufficient documentation

## 2022-09-18 ENCOUNTER — Other Ambulatory Visit: Payer: Self-pay | Admitting: Internal Medicine

## 2022-10-19 DIAGNOSIS — H8111 Benign paroxysmal vertigo, right ear: Secondary | ICD-10-CM | POA: Diagnosis not present

## 2022-10-19 DIAGNOSIS — H9042 Sensorineural hearing loss, unilateral, left ear, with unrestricted hearing on the contralateral side: Secondary | ICD-10-CM | POA: Diagnosis not present

## 2022-10-19 DIAGNOSIS — R42 Dizziness and giddiness: Secondary | ICD-10-CM | POA: Diagnosis not present

## 2022-10-19 DIAGNOSIS — H838X2 Other specified diseases of left inner ear: Secondary | ICD-10-CM | POA: Diagnosis not present

## 2022-10-23 DIAGNOSIS — E782 Mixed hyperlipidemia: Secondary | ICD-10-CM | POA: Diagnosis not present

## 2022-10-23 DIAGNOSIS — I10 Essential (primary) hypertension: Secondary | ICD-10-CM | POA: Diagnosis not present

## 2022-10-23 DIAGNOSIS — I519 Heart disease, unspecified: Secondary | ICD-10-CM | POA: Diagnosis not present

## 2022-10-23 DIAGNOSIS — I38 Endocarditis, valve unspecified: Secondary | ICD-10-CM | POA: Diagnosis not present

## 2022-10-26 ENCOUNTER — Telehealth: Payer: Self-pay

## 2022-10-26 NOTE — Progress Notes (Signed)
Care Management & Coordination Services Pharmacy Team  Reason for Encounter: Appointment Reminder  Contacted patient to confirm telephone appointment with Cherylin Mylar, PharmD on 10-28-2022 at 9:00. Unsuccessful outreach. Left voicemail for patient to return call.   Chart review: Recent office visits:  05-14-2022 Dorothyann Peng, MD. Follow up visit for HTN. No changes follow up in 6 months.  [id number] Barb Merino, LPN. Medicare wellness visit. Tdap given  Recent consult visits:  05-25-2022 Christia Reading, MD (Ophthalmology). Visit for cataract Evaluation.  05-19-2022 Alfonse Spruce, MD (Allergy/asthma).  Follow up visit for asthma. Spirometry completed.   Hospital visits:  None in previous 6 months  Star Rating Drugs:  Atorvastatin 40 mg- Last filled 09-18-2022 90 [facility]. Previous 06-22-2022 [facility]. Telmisartan 20 mg- Last filled 09-21-2022 90 [facility]. Previous 06-24-2022 90 [facility].  Care Gaps: Annual wellness visit in last year? Yes Shingrix overdue Covid booster overdue  Huey Romans [facility] Clinical Pharmacist Assistant [phone]

## 2022-10-28 ENCOUNTER — Ambulatory Visit: Payer: Medicare Other

## 2022-10-28 NOTE — Progress Notes (Signed)
Care Management & Coordination Services Pharmacy Note  10/28/2022 Name:  Paula Pollard MRN:  [id number] DOB:  03-Jun-1930  Summary: Patient reports that she is okay with talking to me on the phone today but she has an appointment but she has an appointment with Dr. Allyne Pollard tomorrow.   Recommendations/Changes made from today's visit: Recommend Paula Pollard call the PCP team with any questions that she has  Recommend patient receive Shingrix vaccine.  Recommend Paula Pollard have orthostatic hypotension check during visit  tomorrow - Blood pressure monitoring. This involves measuring blood pressure while sitting and standing. A drop of 20 millimeters of mercury (mm Hg) in the top number (systolic blood pressure) within 2 to 5 minutes of standing is a sign of orthostatic hypotension. A drop of 10 mm Hg in the bottom number (diastolic blood pressure) within 2 to 5 minutes of standing also indicates orthostatic hypotension. Recommend possible UTI screening, patient reported diarrhea when she in [place]  Follow up plan: Patient to continue current exercising at least three days per week for no less than 15 minutes for the next three months.  Scheduled visit with the PCP team for dizziness, and confusion, patient thought she had an appointment tomorrow with Dr. Allyne Pollard but she does not have a visit until 6/10, patient to have appointment on tomorrow a 9:20 AM.  Patient to be seen in the office tomorrow    Subjective: Paula Pollard is an 89 y.o. year old female who is a primary patient of Paula Peng, MD.  The care coordination team was consulted for assistance with disease management and care coordination needs.    Engaged with patient by telephone for follow up visit. Patient reports that she is has had vertigo and that is why she is going to see the doctor. She is looking forward to discuss her concerns about dizziness tomorrow with Dr. Allyne Pollard.  Recent office visits:  05-14-2022  Paula Peng, MD. Follow up visit for HTN. No changes follow up in 6 months.   [phone] Paula Merino, LPN. Medicare wellness visit. Tdap given   Recent consult visits:  05-25-2022 Paula Reading, MD (Ophthalmology). Visit for cataract Evaluation.   05-19-2022 Paula Spruce, MD (Allergy/asthma).  Follow up visit for asthma. Spirometry completed.    Hospital visits:  None in previous 6 months   Objective:  Lab Results  Component Value Date   CREATININE 0.72 05/14/2022   BUN 8 (L) 05/14/2022   EGFR 79 05/14/2022   GFRNONAA 68 04/18/2020   GFRAA 78 04/18/2020   NA 138 05/14/2022   K 4.0 05/14/2022   CALCIUM 9.4 05/14/2022   CO2 27 05/14/2022   GLUCOSE 91 05/14/2022    Lab Results  Component Value Date/Time   HGBA1C 6.0 (H) 05/14/2022 12:11 PM   HGBA1C 5.8 (H) 10/27/2021 11:58 AM   MICROALBUR 10 04/23/2021 03:46 PM   MICROALBUR 10 04/18/2020 03:20 PM    Last diabetic Eye exam: No results found for: "HMDIABEYEEXA"  Last diabetic Foot exam: No results found for: "HMDIABFOOTEX"   Lab Results  Component Value Date   CHOL 126 10/27/2021   HDL 61 10/27/2021   LDLCALC 46 10/27/2021   TRIG 105 10/27/2021   CHOLHDL 2.1 10/27/2021       Latest Ref Rng & Units 05/14/2022   12:11 PM 04/23/2021   11:27 AM 04/18/2020   12:23 PM  Hepatic Function  Total Protein 6.0 - 8.5 g/dL 7.4  6.9  7.4   Albumin 3.6 -  4.6 g/dL 4.4  4.0  4.3   AST 0 - 40 IU/L 24  25  20    ALT 0 - 32 IU/L 17  16  13    Alk Phosphatase 44 - 121 IU/L 77  63  70   Total Bilirubin 0.0 - 1.2 mg/dL 0.5  0.5  0.4     Lab Results  Component Value Date/Time   TSH 3.250 04/23/2021 11:27 AM   TSH 2.460 12/28/2018 02:26 PM       Latest Ref Rng & Units 07/15/2021    4:30 PM 07/15/2021    4:22 PM 04/23/2021   11:27 AM  CBC  WBC 4.0 - 10.5 K/uL  4.8  3.6   Hemoglobin 12.0 - 15.0 g/dL 16.1  09.6  04.5   Hematocrit 36.0 - 46.0 % 37.0  39.0  38.2   Platelets 150 - 400 K/uL  217  207      Lab Results  Component Value Date/Time   VD25OH 56.9 05/14/2022 12:11 PM   VITAMINB12 >2000 (H) 10/27/2021 11:58 AM   VITAMINB12 >2000 (H) 08/12/2020 03:00 PM    Clinical ASCVD: No  The ASCVD Risk score (Arnett DK, et al., 2019) failed to calculate for the following reasons:   The 2019 ASCVD risk score is only valid for ages 60 to 66        05/14/2022   11:18 AM 04/23/2021   10:12 AM 04/18/2020   11:14 AM  Depression screen PHQ 2/9  Decreased Interest 0 0 0  Down, Depressed, Hopeless 0 0 1  PHQ - 2 Score 0 0 1  Altered sleeping   0  Tired, decreased energy   0  Change in appetite   0  Feeling bad or failure about yourself    0  Trouble concentrating   0  Moving slowly or fidgety/restless   0  Suicidal thoughts   0  PHQ-9 Score   1  Difficult doing work/chores   Not difficult at all     Social History   Tobacco Use  Smoking Status Never  Smokeless Tobacco Never   BP Readings from Last 3 Encounters:  05/19/22 134/82  05/14/22 130/82  05/14/22 130/82   Pulse Readings from Last 3 Encounters:  05/19/22 95  05/14/22 88  05/14/22 88   Wt Readings from Last 3 Encounters:  05/19/22 176 lb 6.4 oz (80 kg)  05/14/22 177 lb 11.1 oz (80.6 kg)  05/14/22 177 lb 12.8 oz (80.6 kg)   BMI Readings from Last 3 Encounters:  05/19/22 32.79 kg/m  05/14/22 33.14 kg/m  05/14/22 33.16 kg/m    Allergies  Allergen Reactions   Codeine Nausea Only and Other (See Comments)    Reaction:Dizziness and hallucinations "makes me climb walls"   Doxycycline Other (See Comments)    Medications Reviewed Today     Reviewed by Paula Spruce, MD (Physician) on 05/19/22 at 1304  Med List Status: <None>   Medication Order Taking? Sig Documenting Provider Last Dose Status Informant  acetaminophen (TYLENOL) 500 MG tablet [id number] Yes Take 500 mg by mouth every 6 (six) hours as needed for moderate pain or headache. [provider] Taking Active Self  albuterol  (VENTOLIN HFA) 108 (90 Base) MCG/ACT inhaler [id number]  Inhale 2 puffs into the lungs every 4 (four) hours as needed for wheezing or shortness of breath. Paula Spruce, MD  Active   Ascorbic Acid (VITAMIN C PO) [id number] Yes Take 1 tablet by mouth daily. [provider] Taking Active Self  aspirin EC 81 MG tablet [id number] Yes Take 81 mg by mouth daily. [provider] Taking Active Self  atorvastatin (LIPITOR) 40 MG tablet [id number] Yes Take 40 mg by mouth daily. [provider] Taking Active Self  azelastine (ASTELIN) 0.1 % nasal spray [id number] Yes Place 1 spray into both nostrils daily. Use in each nostril as directed Paula Spruce, MD Taking Active     Discontinued 08/25/12 1845 (Reorder) cholecalciferol (VITAMIN D) 400 units TABS tablet [id number] Yes Take 600 Units by mouth daily. [provider] Taking Active Self  Cyanocobalamin (B-12 PO) [id number] Yes Take 500 mcg by mouth daily. [provider] Taking Active Self  esomeprazole (NEXIUM) 20 MG capsule [id number] Yes Take 20 mg by mouth as needed (for heartburn). [provider] Taking Active Self  fluticasone (FLONASE) 50 MCG/ACT nasal spray [id number] Yes PLACE 2 SPRAYS INTO BOTH NOSTRILS DAILY AS NEEDED FOR ALLERGIES OR RHINITIS. Paula Spruce, MD Taking Active   fluticasone-salmeterol [facility]) [phone] MCG/ACT inhaler [id number] Yes Inhale 2 puffs into the lungs 2 (two) times daily. Paula Spruce, MD Taking Active   hydrochlorothiazide (HYDRODIURIL) 12.5 MG tablet [id number] Yes TAKE 1 TABLET BY MOUTH EVERY DAY Paula Peng, MD Taking Active   meclizine (ANTIVERT) 25 MG tablet [id number] Yes Take 12.5 mg by mouth 3 (three) times daily as needed for dizziness. [provider] Taking Active Self  metoprolol succinate (TOPROL-XL) 50 MG 24 hr tablet [id number] Yes Take 50 mg by mouth daily. [provider] Taking Active Self  Multiple  Vitamins-Minerals (ECHINACEA ACZ PO) [id number] Yes Take 1 capsule by mouth as needed (for immune support). [provider] Taking Active Self  nitroGLYCERIN (NITROSTAT) 0.4 MG SL tablet [id number] Yes Place 0.4 mg under the tongue every 5 (five) minutes as needed for chest pain. [provider] Taking Active Self  Polyethyl Glycol-Propyl Glycol 0.4-0.3 % SOLN [id number] Yes Apply 1 drop to eye 2 (two) times a day. systane [provider] Taking Active Self  telmisartan (MICARDIS) 20 MG tablet [id number] Yes TAKE 1 TABLET BY MOUTH EVERY DAY Paula Peng, MD Taking Active   vitamin E 400 UNIT capsule [id number] Yes Take 400 Units by mouth daily. [provider] Taking Active Self            SDOH:  (Social Determinants of Health) assessments and interventions performed: No SDOH Interventions    Flowsheet Row Clinical Support from 05/14/2022 in [facility] Clinical Support from 04/18/2020 in [facility] Clinical Support from 04/12/2019 in [facility]  SDOH Interventions     Food Insecurity Interventions Intervention Not Indicated -- --  Transportation Interventions Intervention Not Indicated -- --  Depression Interventions/Treatment  -- KKX3-8 Score <4 Follow-up Not Indicated PHQ2-9 Score <4 Follow-up Not Indicated  Financial Strain Interventions Intervention Not Indicated -- --  Physical Activity Interventions Intervention Not Indicated -- --  Stress Interventions Intervention Not Indicated -- --       Medication Assistance: None required.  Patient affirms current coverage meets needs.  Medication Access: Name and location of current pharmacy:  [id number] [place], [place] - [address] AT [facility] [address] [place] [address] Phone: [phone] Fax: [phone]  [id number] Oretha Ellis, [place] - [address] [place] [address] [place] [address] Phone: [phone] Fax: [phone]  Within the past 30  days, how often has patient missed a dose of medication? none Is a pillbox or other method used to improve adherence? Yes  Factors that may affect medication adherence? adverse effects of medications and lack of understanding of disease management Are meds synced by current pharmacy? No  Are meds delivered by current pharmacy? No  Does patient experience delays in picking up medications due to transportation concerns? No   Compliance/Adherence/Medication fill history: Care Gaps: No Care Gaps noted   Star-Rating Drugs: Atorvastatin 40 mg- [facility]18-2022 [facility] Previous 06-22-2022 [facility]. Telmisartan 20 mg- Last filled [facility]-21-2022 [facility] Previous 06-24-2022 90 [facility].   Assessment/Plan  Hypertension (BP goal <140/90) -Controlled -Current treatment: Metoprolol Succinate 50 mg tablet once per day Appropriate, Effective, Safe, Accessible Hydrocholorthiazide 12.5 mg tablet once per day Appropriate, Effective, Safe, Accessible Telmisartan 20 mg tablet once per day Appropriate, Effective, Safe, Accessible  -Current home readings: 137/82, pulse: 80, 135/84 pulse: 77, 135/79 pulse: 76 -Current dietary habits: she is eating very healthy, yesterday she cooked a steak and had rice with gravy with broccoli, enough for a couple days.  -Current exercise habits: she is exercise every morning for at least 15 minutes.  -Denies hypotensive/hypertensive symptoms -Educated on Importance of home blood pressure monitoring; Proper BP monitoring technique; Symptoms of hypotension and importance of maintaining adequate hydration; -Patient reports that she has had multiple high BP readings including over 150 and the bottom number 89.  -She reports that sometimes she has dizziness when she lays down, and when she gets up she has to take her time.   -She has not had any falls.  -Counseled to monitor BP at home at least 5 days times per week, document, and provide log at future appointments -Recommended to continue current medication   OTHER/CONFUSION -Uncontrolled -Patient reports that she is going to be seen by Dr. Allyne Pollard tomorrow, and would no longer like to have phone visits because this is probably a spam call.  I explained to Ms. Senters that I was calling from the office and that we reviewed multiple things together during our visit today.  -She reported that she is sure that she has an appointment with Dr. Allyne Pollard tomorrow but she does not.  -I recommended that she be seen by the PCP team sooner then later. -Collaborated with PCP to schedule an appointment with the PCP team.  -Patient reports that she will leave the office if she is not seen by Dr. Allyne Pollard. I spoke with the patient in great detail about the importance of being seen tomorrow.  -I tried to reassure her that Dr. Allyne Pollard will be at the office but she does not have any openings.  -Dr. Allyne Pollard also made patient aware that she is going to try to pop into her appointment.  -Ms. Nardone reported that she was fine with this for now but needed to end the call. I thanked her for for her time and am hopeful that she will show up for her visit tomorrow in office.  -Recommended to continue current medication    Cherylin Mylar, CPP, PharmD Clinical Pharmacist Practitioner [facility] [phone]

## 2022-10-29 ENCOUNTER — Ambulatory Visit: Payer: Self-pay | Admitting: Family Medicine

## 2022-11-03 ENCOUNTER — Telehealth: Payer: Self-pay | Admitting: Allergy & Immunology

## 2022-11-03 ENCOUNTER — Encounter: Payer: Self-pay | Admitting: Internal Medicine

## 2022-11-03 NOTE — Telephone Encounter (Signed)
Paula Pollard called in and would like a [facility] [address].

## 2022-11-03 NOTE — Telephone Encounter (Signed)
Patient still has a refill at her pharmacy for the albuterol inhaler patient has been notified. Pharmacy is filling it for her to pick up today.

## 2022-11-16 ENCOUNTER — Encounter: Payer: Self-pay | Admitting: Internal Medicine

## 2022-11-16 ENCOUNTER — Ambulatory Visit (INDEPENDENT_AMBULATORY_CARE_PROVIDER_SITE_OTHER): Payer: Medicare Other | Admitting: Internal Medicine

## 2022-11-16 VITALS — BP 130/82 | HR 73 | Temp 98.2°F | Ht 61.0 in | Wt 158.2 lb

## 2022-11-16 DIAGNOSIS — M542 Cervicalgia: Secondary | ICD-10-CM | POA: Diagnosis not present

## 2022-11-16 DIAGNOSIS — N182 Chronic kidney disease, stage 2 (mild): Secondary | ICD-10-CM | POA: Diagnosis not present

## 2022-11-16 DIAGNOSIS — E663 Overweight: Secondary | ICD-10-CM

## 2022-11-16 DIAGNOSIS — Z6829 Body mass index (BMI) 29.0-29.9, adult: Secondary | ICD-10-CM

## 2022-11-16 DIAGNOSIS — R7309 Other abnormal glucose: Secondary | ICD-10-CM

## 2022-11-16 DIAGNOSIS — E2839 Other primary ovarian failure: Secondary | ICD-10-CM

## 2022-11-16 DIAGNOSIS — K591 Functional diarrhea: Secondary | ICD-10-CM | POA: Diagnosis not present

## 2022-11-16 DIAGNOSIS — I129 Hypertensive chronic kidney disease with stage 1 through stage 4 chronic kidney disease, or unspecified chronic kidney disease: Secondary | ICD-10-CM

## 2022-11-16 DIAGNOSIS — E559 Vitamin D deficiency, unspecified: Secondary | ICD-10-CM | POA: Diagnosis not present

## 2022-11-16 NOTE — Progress Notes (Signed)
Subjective:  Patient ID: Paula Pollard , female    DOB: September 28, 1929 , 89 y.o.   MRN: [id number]  Chief Complaint  Patient presents with   Hypertension    HPI  She is here today for BP check. She reports compliance with meds. She denies headaches, chest pain and shortness of breath. She feels well, has no specific concerns or complaints at this time.   She reports she had to go to ER while visiting her brother and niece in [place], [place]. States she had to go for further evaluation of diarrhea. At that time, she had fever/chills. She was not given any discharge paperwork. Unfortunately, she states she still has intermittent diarrhea. Currently, no fever/chills. No change in diet. Family did not get ill when she had initial onset of symptoms.   Hypertension This is a chronic problem. The current episode started more than 1 year ago. The problem has been gradually improving since onset. The problem is controlled. Associated symptoms include neck pain. Pertinent negatives include no blurred vision. Risk factors for coronary artery disease include sedentary lifestyle, obesity, stress and post-menopausal state. There are no compliance problems.  Hypertensive end-organ damage includes kidney disease.     Past Medical History:  Diagnosis Date   Anemia    "in past as a young girl"   Anxiety    Arthritis    Asthma    Bronchitis    hx of   Cataract    bilaterally, "no surgery at this time"   Diabetes mellitus without complication (HCC)    pre diabetic   Dysrhythmia    when gets excited   GERD (gastroesophageal reflux disease)    Headache(784.0)    hx of migraines   History of hiatal hernia    Hyperlipidemia    Hypertension    sees Dr. Sharyn Lull   Neuromuscular disorder (HCC)    hx of carpal tunnel "never had surgery for"right hand     Family History  Problem Relation Age of Onset   Heart attack Mother    Heart attack Father    Stroke Father    Stroke Daughter    Colon cancer  Sister      Current Outpatient Medications:    acetaminophen (TYLENOL) 500 MG tablet, Take 500 mg by mouth every 6 (six) hours as needed for moderate pain or headache., Disp: , Rfl:    Ascorbic Acid (VITAMIN C PO), Take 1 tablet by mouth daily., Disp: , Rfl:    aspirin EC 81 MG tablet, Take 81 mg by mouth daily., Disp: , Rfl:    atorvastatin (LIPITOR) 40 MG tablet, Take 40 mg by mouth daily., Disp: , Rfl:    azelastine (ASTELIN) 0.1 % nasal spray, Place 1 spray into both nostrils daily. Use in each nostril as directed, Disp: 30 mL, Rfl: 5   cholecalciferol (VITAMIN D) 400 units TABS tablet, Take 600 Units by mouth daily., Disp: , Rfl:    Cyanocobalamin (B-12 PO), Take 500 mcg by mouth daily., Disp: , Rfl:    esomeprazole (NEXIUM) 20 MG capsule, Take 20 mg by mouth as needed (for heartburn)., Disp: , Rfl:    fluticasone (FLONASE) 50 MCG/ACT nasal spray, PLACE 2 SPRAYS INTO BOTH NOSTRILS DAILY AS NEEDED FOR ALLERGIES OR RHINITIS., Disp: 48 mL, Rfl: 1   fluticasone-salmeterol (ADVAIR HFA) 115-21 MCG/ACT inhaler, Inhale 2 puffs into the lungs 2 (two) times daily., Disp: 1 each, Rfl: 5   hydrochlorothiazide (HYDRODIURIL) 12.5 MG tablet, TAKE 1 TABLET BY  MOUTH EVERY DAY, Disp: 90 tablet, Rfl: 1   meclizine (ANTIVERT) 25 MG tablet, Take 12.5 mg by mouth 3 (three) times daily as needed for dizziness., Disp: , Rfl:    metoprolol succinate (TOPROL-XL) 50 MG 24 hr tablet, Take 50 mg by mouth daily., Disp: , Rfl:    Multiple Vitamins-Minerals (ECHINACEA ACZ PO), Take 1 capsule by mouth as needed (for immune support)., Disp: , Rfl:    nitroGLYCERIN (NITROSTAT) 0.4 MG SL tablet, Place 0.4 mg under the tongue every 5 (five) minutes as needed for chest pain., Disp: , Rfl:    Polyethyl Glycol-Propyl Glycol 0.4-0.3 % SOLN, Apply 1 drop to eye 2 (two) times a day. systane, Disp: , Rfl:    telmisartan (MICARDIS) 20 MG tablet, TAKE 1 TABLET BY MOUTH EVERY DAY, Disp: 90 tablet, Rfl: 1   VENTOLIN HFA 108 (90 Base)  MCG/ACT inhaler, INHALE 2 PUFFS INTO THE LUNGS EVERY 4 HOURS AS NEEDED FOR WHEEZING OR SHORTNESS OF BREATH., Disp: 18 each, Rfl: 1   vitamin E 400 UNIT capsule, Take 400 Units by mouth daily., Disp: , Rfl:    Allergies  Allergen Reactions   Codeine Nausea Only and Other (See Comments)    Reaction:Dizziness and hallucinations "makes me climb walls"   Doxycycline Other (See Comments)     Review of Systems  Constitutional: Negative.   Eyes:  Negative for blurred vision.  Respiratory: Negative.    Cardiovascular: Negative.   Musculoskeletal:  Positive for neck pain.       She c/o neck pain. Denies fall/trauma. She is not sure what may have triggered her symptoms. Denies UE weakness/paresthesias.   Neurological: Negative.   Psychiatric/Behavioral: Negative.       Today's Vitals   11/16/22 1050  BP: 130/82  Pulse: 73  Temp: 98.2 F (36.8 C)  SpO2: 98%  Weight: 158 lb 3.2 oz (71.8 kg)  Height: 5\' 1"  (1.549 m)  PainSc: 4   PainLoc: Neck   Body mass index is 29.89 kg/m.  Wt Readings from Last 3 Encounters:  11/16/22 158 lb 3.2 oz (71.8 kg)  05/19/22 176 lb 6.4 oz (80 kg)  05/14/22 177 lb 11.1 oz (80.6 kg)    The ASCVD Risk score (Arnett DK, et al., 2019) failed to calculate for the following reasons:   The 2019 ASCVD risk score is only valid for ages 57 to 30  Objective:  Physical Exam Vitals and nursing note reviewed.  Constitutional:      Appearance: Normal appearance.  HENT:     Head: Normocephalic and atraumatic.  Eyes:     Extraocular Movements: Extraocular movements intact.  Cardiovascular:     Rate and Rhythm: Normal rate and regular rhythm.     Heart sounds: Normal heart sounds.  Pulmonary:     Effort: Pulmonary effort is normal.     Breath sounds: Normal breath sounds.  Musculoskeletal:     Cervical back: Normal range of motion. No rigidity. Pain with movement and muscular tenderness present.  Skin:    General: Skin is warm.  Neurological:     General:  No focal deficit present.     Mental Status: She is alert.  Psychiatric:        Mood and Affect: Mood normal.        Behavior: Behavior normal.      Assessment And Plan:  1. Hypertensive nephropathy Comments: Chronic, fair control. No med changes. She will c/w telmisartan 20mg  and metoprolol XL 50mg  daily. Advised to  follow low sodium diet. - CMP14+EGFR - Lipid panel - TSH  2. Chronic renal disease, stage II Comments: Chronic, she is encouraged to stay well hydrated, avoid NSAIDs and keep BP well controlled to decrease risk of CKD progression.  3. Cervicalgia Comments: I will refer her to Dr. Althea Charon as requested. Appears to be muscular. May benefit from PT. Will apply topical pain cream to affected area prn. - Ambulatory referral to Sports Medicine  4. Functional diarrhea Comments: I will check labs as below.  I will also request a copy of her hospital h/p, discharge summary, labs and consult notes. Info not available on Care Everywhere. - CMP14+EGFR - CBC with Diff  5. Other abnormal glucose Comments: Previous labs reviewed, her a1c has been elevated in the past. I will recheck an a1c today. Reminded to avoid sugary beverages/foods. - Hemoglobin A1c  6. Overweight with body mass index (BMI) of 29 to 29.9 in adult Comments: She has lost 18 lbs since her last visit. She states it is due to working hard in [place]. I will check thyroid function today.    Return if symptoms worsen or fail to improve.  Patient was given opportunity to ask questions. Patient verbalized understanding of the plan and was able to repeat key elements of the plan. All questions were answered to their satisfaction.   I, Gwynneth Aliment, MD, have reviewed all documentation for this visit. The documentation on 11/16/22 for the exam, diagnosis, procedures, and orders are all accurate and complete.   IF YOU HAVE BEEN REFERRED TO A SPECIALIST, IT MAY TAKE 1-2 WEEKS TO SCHEDULE/PROCESS THE REFERRAL. IF YOU  HAVE NOT HEARD FROM US/SPECIALIST IN TWO WEEKS, PLEASE GIVE [place] A CALL AT [phone].

## 2022-11-16 NOTE — Patient Instructions (Signed)
Hypertension, Adult Hypertension is another name for high blood pressure. High blood pressure forces your heart to work harder to pump blood. This can cause problems over time. There are two numbers in a blood pressure reading. There is a top number (systolic) over a bottom number (diastolic). It is best to have a blood pressure that is below 120/80. What are the causes? The cause of this condition is not known. Some other conditions can lead to high blood pressure. What increases the risk? Some lifestyle factors can make you more likely to develop high blood pressure: Smoking. Not getting enough exercise or physical activity. Being overweight. Having too much fat, sugar, calories, or salt (sodium) in your diet. Drinking too much alcohol. Other risk factors include: Having any of these conditions: Heart disease. Diabetes. High cholesterol. Kidney disease. Obstructive sleep apnea. Having a family history of high blood pressure and high cholesterol. Age. The risk increases with age. Stress. What are the signs or symptoms? High blood pressure may not cause symptoms. Very high blood pressure (hypertensive crisis) may cause: Headache. Fast or uneven heartbeats (palpitations). Shortness of breath. Nosebleed. Vomiting or feeling like you may vomit (nauseous). Changes in how you see. Very bad chest pain. Feeling dizzy. Seizures. How is this treated? This condition is treated by making healthy lifestyle changes, such as: Eating healthy foods. Exercising more. Drinking less alcohol. Your doctor may prescribe medicine if lifestyle changes do not help enough and if: Your top number is above 130. Your bottom number is above 80. Your personal target blood pressure may vary. Follow these instructions at home: Eating and drinking  If told, follow the DASH eating plan. To follow this plan: Fill one half of your plate at each meal with fruits and vegetables. Fill one fourth of your plate  at each meal with whole grains. Whole grains include whole-wheat pasta, brown rice, and whole-grain bread. Eat or drink low-fat dairy products, such as skim milk or low-fat yogurt. Fill one fourth of your plate at each meal with low-fat (lean) proteins. Low-fat proteins include fish, chicken without skin, eggs, beans, and tofu. Avoid fatty meat, cured and processed meat, or chicken with skin. Avoid pre-made or processed food. Limit the amount of salt in your diet to less than 1,500 mg each day. Do not drink alcohol if: Your doctor tells you not to drink. You are pregnant, may be pregnant, or are planning to become pregnant. If you drink alcohol: Limit how much you have to: 0-1 drink a day for women. 0-2 drinks a day for men. Know how much alcohol is in your drink. In the [place] one drink equals one 12 oz bottle of beer (355 mL), one 5 oz glass of wine (148 mL), or one 1 oz glass of hard liquor (44 mL). Lifestyle  Work with your doctor to stay at a healthy weight or to lose weight. Ask your doctor what the best weight is for you. Get at least 30 minutes of exercise that causes your heart to beat faster (aerobic exercise) most days of the week. This may include walking, swimming, or biking. Get at least 30 minutes of exercise that strengthens your muscles (resistance exercise) at least 3 days a week. This may include lifting weights or doing Pilates. Do not smoke or use any products that contain nicotine or tobacco. If you need help quitting, ask your doctor. Check your blood pressure at home as told by your doctor. Keep all follow-up visits. Medicines Take over-the-counter and prescription medicines   only as told by your doctor. Follow directions carefully. Do not skip doses of blood pressure medicine. The medicine does not work as well if you skip doses. Skipping doses also puts you at risk for problems. Ask your doctor about side effects or reactions to medicines that you should watch  for. Contact a doctor if: You think you are having a reaction to the medicine you are taking. You have headaches that keep coming back. You feel dizzy. You have swelling in your ankles. You have trouble with your vision. Get help right away if: You get a very bad headache. You start to feel mixed up (confused). You feel weak or numb. You feel faint. You have very bad pain in your: Chest. Belly (abdomen). You vomit more than once. You have trouble breathing. These symptoms may be an emergency. Get help right away. Call 911. Do not wait to see if the symptoms will go away. Do not drive yourself to the hospital. Summary Hypertension is another name for high blood pressure. High blood pressure forces your heart to work harder to pump blood. For most people, a normal blood pressure is less than 120/80. Making healthy choices can help lower blood pressure. If your blood pressure does not get lower with healthy choices, you may need to take medicine. This information is not intended to replace advice given to you by your health care provider. Make sure you discuss any questions you have with your health care provider. Document Revised: 03/13/2021 Document Reviewed: 03/13/2021 Elsevier Patient Education  2024 Elsevier Inc.  

## 2022-11-17 LAB — CBC WITH DIFFERENTIAL/PLATELET
Basophils Absolute: 0 10*3/uL (ref 0.0–0.2)
Basos: 1 %
EOS (ABSOLUTE): 0.1 10*3/uL (ref 0.0–0.4)
Eos: 3 %
Hematocrit: 41 % (ref 34.0–46.6)
Hemoglobin: 12.9 g/dL (ref 11.1–15.9)
Immature Grans (Abs): 0 10*3/uL (ref 0.0–0.1)
Immature Granulocytes: 0 %
Lymphocytes Absolute: 1.4 10*3/uL (ref 0.7–3.1)
Lymphs: 34 %
MCH: 28.5 pg (ref 26.6–33.0)
MCHC: 31.5 g/dL (ref 31.5–35.7)
MCV: 91 fL (ref 79–97)
Monocytes Absolute: 0.4 10*3/uL (ref 0.1–0.9)
Monocytes: 10 %
Neutrophils Absolute: 2.1 10*3/uL (ref 1.4–7.0)
Neutrophils: 52 %
Platelets: 186 10*3/uL (ref 150–450)
RBC: 4.52 x10E6/uL (ref 3.77–5.28)
RDW: 12.8 % (ref 11.7–15.4)
WBC: 4.1 10*3/uL (ref 3.4–10.8)

## 2022-11-17 LAB — CMP14+EGFR
ALT: 15 IU/L (ref 0–32)
AST: 20 IU/L (ref 0–40)
Albumin/Globulin Ratio: 1.4
Albumin: 4.2 g/dL (ref 3.6–4.6)
Alkaline Phosphatase: 80 IU/L (ref 44–121)
BUN/Creatinine Ratio: 17 (ref 12–28)
BUN: 13 mg/dL (ref 10–36)
Bilirubin Total: 0.5 mg/dL (ref 0.0–1.2)
CO2: 27 mmol/L (ref 20–29)
Calcium: 9.4 mg/dL (ref 8.7–10.3)
Chloride: 100 mmol/L (ref 96–106)
Creatinine, Ser: 0.77 mg/dL (ref 0.57–1.00)
Globulin, Total: 2.9 g/dL (ref 1.5–4.5)
Glucose: 97 mg/dL (ref 70–99)
Potassium: 3.9 mmol/L (ref 3.5–5.2)
Sodium: 138 mmol/L (ref 134–144)
Total Protein: 7.1 g/dL (ref 6.0–8.5)
eGFR: 73 mL/min/{1.73_m2} (ref >59)

## 2022-11-17 LAB — LIPID PANEL
Chol/HDL Ratio: 2.4 ratio (ref 0.0–4.4)
Cholesterol, Total: 140 mg/dL (ref 100–199)
HDL: 59 mg/dL (ref >39)
LDL Chol Calc (NIH): 64 mg/dL (ref 0–99)
Triglycerides: 88 mg/dL (ref 0–149)
VLDL Cholesterol Cal: 17 mg/dL (ref 5–40)

## 2022-11-17 LAB — HEMOGLOBIN A1C
Est. average glucose Bld gHb Est-mCnc: 128 mg/dL
Hgb A1c MFr Bld: 6.1 % — ABNORMAL HIGH (ref 4.8–5.6)

## 2022-11-17 LAB — TSH: TSH: 2.31 u[IU]/mL (ref 0.450–4.500)

## 2022-11-18 ENCOUNTER — Telehealth: Payer: Self-pay

## 2022-11-18 NOTE — Telephone Encounter (Signed)
Per PCP patient to have office visits only. Called Ms. Ringer to make her aware that her visit on Friday was in office. She reports that because the weather is going to be so hot, she would rather have the visit on Friday over the phone. Per patient she would like to leave her appointment on Friday over the phone. She would like help with the cost of her inhaler, but she will let me know on Friday which inhaler she needs help with. She is scheduled for an appointment with her specialist on tomorrow and will have that information on Friday.  Cherylin Mylar, CPP, PharmD Clinical Pharmacist Practitioner [facility] [phone]'

## 2022-11-18 NOTE — Progress Notes (Cosign Needed)
Care Management & Coordination Services Pharmacy Team  Reason for Encounter: Appointment Reminder  Contacted patient to confirm telephone appointment with Cherylin Mylar, PharmD on 11-20-2022 at 10:00. Spoke with patient on 11/18/2022   Do you have any problems getting your medications? Yes If yes what types of problems are you experiencing? Financial barriers with advair inhaler  What is your top health concern you would like to discuss at your upcoming visit? Patent stated no concerns  Have you seen any other providers since your last visit with PCP? No   Chart review: Recent office visits:  11-16-2022 Dorothyann Peng, MD.  Follow up visit for HTN. Referral placed to sports medicine.  Recent consult visits:  None  Hospital visits:  None in previous 6 months  Star Rating Drugs:  Atorvastatin 40 mg- Last filled 09-18-2022 90 [facility]. Previous 06-22-2022 90 [facility]. Telmisartan 20 mg- Last filled 09-21-2022 90 [facility]. Previous 06-24-2022 90 DS  Care Gaps: Annual wellness visit in last year? Yes Covid booster overdue  Huey Romans [facility] Clinical Pharmacist Assistant [phone]

## 2022-11-19 ENCOUNTER — Encounter: Payer: Self-pay | Admitting: Allergy & Immunology

## 2022-11-19 ENCOUNTER — Ambulatory Visit (INDEPENDENT_AMBULATORY_CARE_PROVIDER_SITE_OTHER): Payer: Medicare Other | Admitting: Allergy & Immunology

## 2022-11-19 ENCOUNTER — Other Ambulatory Visit: Payer: Self-pay

## 2022-11-19 ENCOUNTER — Telehealth: Payer: Self-pay | Admitting: Pharmacist

## 2022-11-19 VITALS — BP 128/80 | HR 84 | Temp 98.6°F | Resp 16 | Ht 61.0 in | Wt 159.5 lb

## 2022-11-19 DIAGNOSIS — H9391 Unspecified disorder of right ear: Secondary | ICD-10-CM | POA: Diagnosis not present

## 2022-11-19 DIAGNOSIS — R42 Dizziness and giddiness: Secondary | ICD-10-CM | POA: Diagnosis not present

## 2022-11-19 DIAGNOSIS — J3089 Other allergic rhinitis: Secondary | ICD-10-CM

## 2022-11-19 DIAGNOSIS — H9203 Otalgia, bilateral: Secondary | ICD-10-CM | POA: Diagnosis not present

## 2022-11-19 DIAGNOSIS — J4540 Moderate persistent asthma, uncomplicated: Secondary | ICD-10-CM | POA: Diagnosis not present

## 2022-11-19 MED ORDER — CIPROFLOXACIN-DEXAMETHASONE 0.3-0.1 % OT SUSP: 4.0000 [drp] | Freq: Two times a day (BID) | OTIC | 1 refills | Status: DC

## 2022-11-19 MED ORDER — AZELASTINE HCL 0.1 % NA SOLN: 1.0000 | Freq: Every day | NASAL | 5 refills | Status: DC

## 2022-11-19 MED ORDER — ALBUTEROL SULFATE HFA 108 (90 BASE) MCG/ACT IN AERS: 2.0000 | INHALATION_SPRAY | RESPIRATORY_TRACT | 1 refills | Status: DC | PRN

## 2022-11-19 NOTE — Patient Instructions (Addendum)
1. Mild intermittent asthma without complication - Lung testing looked AMAZING today. - We are changing you to [facility] two puffs twice daily. - [place] and [place] filled out to get you some free drug.  - Hopefully we can get this approved.  - Daily controller medication(s): Breztri two puffs twice daily with spacer  - Prior to physical activity: Ventolin 2 puffs 10-15 minutes before physical activity. - Rescue medications: Ventolin 4 puffs every 4-6 hours as needed - Asthma control goals:  * Full participation in all desired activities (may need albuterol before activity) * Albuterol use two time or less a week on average (not counting use with activity) * Cough interfering with sleep two time or less a month * Oral steroids no more than once a year * No hospitalizations  2. Allergic rhinitis - Continue with Dymista 1-2 sprays per nostril twice daily.  - Continue with Pataday one drop per eye twice daily.  - You can use more of your nose spray when you have a lot of phlegm in your throat.   3. Vertigo - Continue to follow with Dr. Suszanne Conners as needed.   4. Ear lesion on the right side - Start CiproDex one drop per eye twice daily for two weeks. - Hopefully this will allow your ear lesion to heal.   5. Return in about 6 months (around 05/21/2023).    Please inform us of any Emergency Department visits, hospitalizations, or changes in symptoms. Call us before going to the ED for breathing or allergy symptoms since we might be able to fit you in for a sick visit. Feel free to contact us anytime with any questions, problems, or concerns.  It was a pleasure to see you again today! You are such a doll!   Websites that have reliable patient information: 1. American Academy of Asthma, Allergy, and Immunology: www.aaaai.org 2. Food Allergy Research and Education (FARE): foodallergy.org 3. Mothers of Asthmatics: http://www.asthmacommunitynetwork.org 4. American College of Allergy, Asthma, and  Immunology: www.acaai.org    COVID-19 Vaccine Information can be found at: [email or web address] For questions related to vaccine distribution or appointments, please email [email or web address] or call [phone].   We realize that you might be concerned about having an allergic reaction to the COVID19 vaccines. To help with that concern, WE ARE OFFERING THE COVID19 VACCINES IN OUR OFFICE! Ask the front desk for dates!     "Like" [place] on Facebook and Instagram for our latest updates!      A healthy democracy works best when [employer] participate! Make sure you are registered to vote! If you have moved or changed any of your contact information, you will need to get this updated before voting!  In some cases, you MAY be able to register to vote online: [email or web address]

## 2022-11-19 NOTE — Progress Notes (Signed)
Contacted patient to cancel appointment with [facility] Pharmacist. She noted that she received paperwork for [facility] assistance and will complete and return to Dr. Ellouise Newer office for [facility] assistance.    Catie Eppie Gibson, PharmD, BCACP, CPP [facility] [phone]

## 2022-11-19 NOTE — Progress Notes (Signed)
FOLLOW UP  Date of Service/Encounter:  11/19/22   Assessment:   Mild intermittent asthma without complication   Allergic rhinitis  Right ear lesion with bilateral otalgia - adding Ciprodex   Vertigo  Plan/Recommendations:   1. Mild intermittent asthma without complication - Lung testing looked AMAZING today. - We are changing you to [facility] two puffs twice daily. - [place] and [place] form filled out to get you some free drug.  - Hopefully we can get this approved.  - Daily controller medication(s): Breztri two puffs twice daily with spacer  - Prior to physical activity: Ventolin 2 puffs 10-15 minutes before physical activity. - Rescue medications: Ventolin 4 puffs every 4-6 hours as needed - Asthma control goals:  * Full participation in all desired activities (may need albuterol before activity) * Albuterol use two time or less a week on average (not counting use with activity) * Cough interfering with sleep two time or less a month * Oral steroids no more than once a year * No hospitalizations  2. Allergic rhinitis - Continue with Dymista 1-2 sprays per nostril twice daily.  - Continue with Pataday one drop per eye twice daily.  - You can use more of your nose spray when you have a lot of phlegm in your throat.   3. Vertigo - Continue to follow with Dr. Suszanne Conners as needed.   4. Ear lesion on the right side - Start CiproDex one drop per eye twice daily for two weeks. - Hopefully this will allow your ear lesion to heal.   5. Return in about 6 months (around 05/21/2023).    Subjective:   Paula Pollard is a 89 y.o. female presenting today for follow up of  Chief Complaint  Patient presents with   Asthma    No issues     Paula Pollard has a history of the following: Patient Active Problem List   Diagnosis Date Noted   Cervicalgia 11/16/2022   Hypertensive nephropathy 04/23/2021   Chronic renal disease, stage II 04/23/2021   Hair loss 04/23/2021   Other  abnormal glucose 04/23/2021   Mild intermittent asthma 06/26/2019   Vertigo 10/28/2018   Hypokalemia 10/28/2018   Acute sinusitis 10/05/2017   Other allergic rhinitis 10/05/2017   Asthma with acute exacerbation 03/06/2015   Unexplained night sweats 03/06/2015   Cough 02/16/2015   Essential hypertension, benign 08/25/2012   Other and unspecified hyperlipidemia 08/25/2012   Benign paroxysmal positional vertigo 08/25/2012   Unspecified constipation 08/25/2012   Generalized anxiety disorder 08/25/2012   GERD (gastroesophageal reflux disease) 08/25/2012   Osteoarthritis of right hip 08/10/2012    History obtained from: chart review and patient.  Paula Pollard is a 89 y.o. female presenting for a follow up visit.  She was last seen in December 2023.  At that time, lung testing stable.  We continue with Advair 115/21 mcg 2 puffs 1-2 times daily as well as Ventolin.  For her allergic rhinitis, we continue with Dymista as well as Pataday.  She continue to follow with Dr. Suszanne Conners for her vertigo.  Since last visit, she has done very well.  Asthma/Respiratory Symptom History: She does have the Advair, but it cost her $200 per refill.  She is wondering if there is anything cheaper we can try.  She does not use it every day.  She tends to use at least once a day.  She has been doing very well mostly.  She has not needed prednisone or been to the  emergency room.  She is open to trying a different inhaler if it is going to be covered better.  He has been able to do stuff that she wants to do around the house.  Allergic Rhinitis Symptom History: Allergic rhinitis is under good control with the Dymista and the Pataday.  She has not been on antibiotics.  She denies any postnasal drip.  This allergy season has not been particularly terrible for her.  She does report that she has a sore in the right ear which is irritating her.  She has been doing hydrogen peroxide rinses to try to clear out the cerumen.  This lesion  has been there for about a week or so.  She recently returned from [place].  She was helping take care of her 41 year old brother who recently had a blood clot.  She remains very active with her family.  She always speaks very highly of them.  Otherwise, there have been no changes to her past medical history, surgical history, family history, or social history.    Review of Systems  Constitutional: Negative.  Negative for chills, fever, malaise/fatigue and weight loss.  HENT:  Negative for congestion, ear discharge, ear pain and sinus pain.   Eyes:  Negative for pain, discharge and redness.  Respiratory:  Positive for cough and wheezing. Negative for sputum production and shortness of breath.   Cardiovascular: Negative.  Negative for chest pain and palpitations.  Gastrointestinal:  Negative for abdominal pain, constipation, diarrhea, heartburn, nausea and vomiting.  Skin: Negative.  Negative for itching and rash.  Neurological:  Negative for dizziness and headaches.  Endo/Heme/Allergies:  Positive for environmental allergies. Does not bruise/bleed easily.       Objective:   Blood pressure 128/80, pulse 84, temperature 98.6 F (37 C), resp. rate 16, height 5\' 1"  (1.549 m), weight 159 lb 8 oz (72.3 kg), SpO2 97 %. Body mass index is 30.14 kg/m.    Physical Exam Vitals reviewed.  Constitutional:      Appearance: She is well-developed.     Comments: Delightful female.  Very interactive. Very sweet!  Talkative.  HENT:     Head: Normocephalic and atraumatic.     Right Ear: Tympanic membrane, ear canal and external ear normal.     Left Ear: Tympanic membrane, ear canal and external ear normal.     Nose: No nasal deformity, septal deviation, mucosal edema or rhinorrhea.     Right Turbinates: Enlarged, swollen and pale.     Left Turbinates: Enlarged, swollen and pale.     Right Sinus: No maxillary sinus tenderness or frontal sinus tenderness.     Left Sinus: No maxillary sinus  tenderness or frontal sinus tenderness.     Mouth/Throat:     Lips: Pink.     Mouth: Mucous membranes are moist. Mucous membranes are not pale and not dry.     Pharynx: Uvula midline.     Comments: Cobblestoning in the posterior oropharynx.  Eyes:     General: Lids are normal. No allergic shiner.       Right eye: No discharge.        Left eye: No discharge.     Conjunctiva/sclera: Conjunctivae normal.     Right eye: Right conjunctiva is not injected. No chemosis.    Left eye: Left conjunctiva is not injected. No chemosis.    Pupils: Pupils are equal, round, and reactive to light.  Cardiovascular:     Rate and Rhythm: Normal rate and regular rhythm.  Heart sounds: Normal heart sounds.  Pulmonary:     Effort: Pulmonary effort is normal. No tachypnea, accessory muscle usage or respiratory distress.     Breath sounds: Normal breath sounds. No wheezing, rhonchi or rales.     Comments: Moving air well in all lung fields.  No wheezes noted. Chest:     Chest wall: No tenderness.  Lymphadenopathy:     Cervical: No cervical adenopathy.  Skin:    Coloration: Skin is not pale.     Findings: No abrasion, erythema, petechiae or rash. Rash is not papular, urticarial or vesicular.  Neurological:     Mental Status: She is alert.  Psychiatric:        Behavior: Behavior is cooperative.      Diagnostic studies:    Spirometry: results normal (FEV1: 1.06/80%, FVC: 1.31/72%, FEV1/FVC: 81%).    Spirometry consistent with normal pattern.   Allergy Studies: none        Malachi Bonds, MD  [facility]

## 2022-11-20 ENCOUNTER — Telehealth: Payer: Self-pay

## 2022-11-20 NOTE — Telephone Encounter (Signed)
[place] provider portion has been filled out. Patient needs to bring patient portion back to the [place] office to be faxed together.    Dr. Dellis Anes needs to also sign Mark[employer] Daft Script before forms are faxed to [place]&ME.

## 2022-11-23 DIAGNOSIS — M542 Cervicalgia: Secondary | ICD-10-CM | POA: Diagnosis not present

## 2022-11-25 DIAGNOSIS — H8111 Benign paroxysmal vertigo, right ear: Secondary | ICD-10-CM | POA: Diagnosis not present

## 2022-11-25 DIAGNOSIS — H9042 Sensorineural hearing loss, unilateral, left ear, with unrestricted hearing on the contralateral side: Secondary | ICD-10-CM | POA: Diagnosis not present

## 2022-12-17 ENCOUNTER — Other Ambulatory Visit: Payer: Self-pay | Admitting: Allergy & Immunology

## 2022-12-18 NOTE — Telephone Encounter (Signed)
Prescription has been signed by Dr. Dellis Anes and is in the pending tray in the [facility] office.

## 2022-12-24 ENCOUNTER — Other Ambulatory Visit: Payer: Medicare Other | Admitting: Pharmacist

## 2022-12-24 NOTE — Progress Notes (Signed)
Care Coordination Call  Contacted patient to follow up on application status of Breztri assistance. She notes that she used the sample provided by Dr. Dellis Anes and did have benefit, but does not want to fill out the patient assistance application for [employer] because she does not want to give our her information. Explained to her the purpose of the program, the frequency with which our patients apply with no negative impact on credit, identify, but patient is not interested.   She reports her breathing is worse over the summer, but is taking albuterol HFA twice daily.   I encouraged her to think about the [employer] application for [place] and let me know if she changes her mind.   Catie Eppie Gibson, PharmD, BCACP, CPP Clinical Pharmacist [facility] [phone]

## 2023-01-18 ENCOUNTER — Other Ambulatory Visit: Payer: Self-pay | Admitting: Internal Medicine

## 2023-01-20 DIAGNOSIS — I2510 Atherosclerotic heart disease of native coronary artery without angina pectoris: Secondary | ICD-10-CM | POA: Diagnosis not present

## 2023-01-20 DIAGNOSIS — I519 Heart disease, unspecified: Secondary | ICD-10-CM | POA: Diagnosis not present

## 2023-01-20 DIAGNOSIS — I38 Endocarditis, valve unspecified: Secondary | ICD-10-CM | POA: Diagnosis not present

## 2023-01-20 DIAGNOSIS — I10 Essential (primary) hypertension: Secondary | ICD-10-CM | POA: Diagnosis not present

## 2023-02-05 DIAGNOSIS — R42 Dizziness and giddiness: Secondary | ICD-10-CM | POA: Diagnosis not present

## 2023-02-05 DIAGNOSIS — H8111 Benign paroxysmal vertigo, right ear: Secondary | ICD-10-CM | POA: Diagnosis not present

## 2023-02-05 DIAGNOSIS — H60331 Swimmer's ear, right ear: Secondary | ICD-10-CM | POA: Diagnosis not present

## 2023-02-19 ENCOUNTER — Other Ambulatory Visit: Payer: Self-pay | Admitting: Allergy & Immunology

## 2023-02-19 ENCOUNTER — Other Ambulatory Visit: Payer: Self-pay | Admitting: Internal Medicine

## 2023-04-15 DIAGNOSIS — H2513 Age-related nuclear cataract, bilateral: Secondary | ICD-10-CM | POA: Diagnosis not present

## 2023-04-19 ENCOUNTER — Other Ambulatory Visit: Payer: Self-pay | Admitting: Allergy & Immunology

## 2023-04-21 ENCOUNTER — Telehealth: Payer: Self-pay | Admitting: Allergy & Immunology

## 2023-04-21 DIAGNOSIS — E782 Mixed hyperlipidemia: Secondary | ICD-10-CM | POA: Diagnosis not present

## 2023-04-21 DIAGNOSIS — I25118 Atherosclerotic heart disease of native coronary artery with other forms of angina pectoris: Secondary | ICD-10-CM | POA: Diagnosis not present

## 2023-04-21 DIAGNOSIS — I10 Essential (primary) hypertension: Secondary | ICD-10-CM | POA: Diagnosis not present

## 2023-04-21 DIAGNOSIS — I519 Heart disease, unspecified: Secondary | ICD-10-CM | POA: Diagnosis not present

## 2023-04-21 NOTE — Telephone Encounter (Signed)
Patient asking for nurse to call in reference to her current medications (inhaler)

## 2023-04-21 NOTE — Telephone Encounter (Signed)
Patient cleared it up with the pharmacy.  [place] [phone]

## 2023-05-05 DIAGNOSIS — Z1231 Encounter for screening mammogram for malignant neoplasm of breast: Secondary | ICD-10-CM | POA: Diagnosis not present

## 2023-05-05 LAB — HM MAMMOGRAPHY

## 2023-05-20 ENCOUNTER — Ambulatory Visit (INDEPENDENT_AMBULATORY_CARE_PROVIDER_SITE_OTHER): Payer: Medicare Other | Admitting: Internal Medicine

## 2023-05-20 ENCOUNTER — Ambulatory Visit: Payer: Medicare Other

## 2023-05-20 ENCOUNTER — Encounter: Payer: Self-pay | Admitting: Internal Medicine

## 2023-05-20 VITALS — BP 130/90 | HR 85 | Temp 97.7°F | Ht 62.0 in | Wt 158.0 lb

## 2023-05-20 DIAGNOSIS — Z0000 Encounter for general adult medical examination without abnormal findings: Secondary | ICD-10-CM | POA: Diagnosis not present

## 2023-05-20 DIAGNOSIS — R195 Other fecal abnormalities: Secondary | ICD-10-CM | POA: Diagnosis not present

## 2023-05-20 DIAGNOSIS — Z79899 Other long term (current) drug therapy: Secondary | ICD-10-CM | POA: Diagnosis not present

## 2023-05-20 DIAGNOSIS — I129 Hypertensive chronic kidney disease with stage 1 through stage 4 chronic kidney disease, or unspecified chronic kidney disease: Secondary | ICD-10-CM

## 2023-05-20 DIAGNOSIS — N182 Chronic kidney disease, stage 2 (mild): Secondary | ICD-10-CM | POA: Diagnosis not present

## 2023-05-20 DIAGNOSIS — K219 Gastro-esophageal reflux disease without esophagitis: Secondary | ICD-10-CM

## 2023-05-20 DIAGNOSIS — E559 Vitamin D deficiency, unspecified: Secondary | ICD-10-CM

## 2023-05-20 DIAGNOSIS — L700 Acne vulgaris: Secondary | ICD-10-CM

## 2023-05-20 DIAGNOSIS — J45901 Unspecified asthma with (acute) exacerbation: Secondary | ICD-10-CM

## 2023-05-20 DIAGNOSIS — R7309 Other abnormal glucose: Secondary | ICD-10-CM | POA: Diagnosis not present

## 2023-05-20 DIAGNOSIS — M542 Cervicalgia: Secondary | ICD-10-CM

## 2023-05-20 NOTE — Progress Notes (Signed)
I,Victoria T Deloria Lair, CMA,acting as a Neurosurgeon for Gwynneth Aliment, MD.,have documented all relevant documentation on the behalf of Gwynneth Aliment, MD,as directed by  Gwynneth Aliment, MD while in the presence of Gwynneth Aliment, MD.  Subjective:  Patient ID: Paula Pollard , female    DOB: 25-Mar-1930 , 89 y.o.   MRN: [id number]  Chief Complaint  Patient presents with   Hypertension    HPI  She is here today for BP check. She reports compliance with meds. She denies headaches, chest pain and shortness of breath. She feels well, has no specific concerns or complaints at this time.   AWV completed with [facility] Advisor [place].   Hypertension This is a chronic problem. The current episode started more than 1 year ago. The problem has been gradually improving since onset. The problem is controlled. Associated symptoms include neck pain. Pertinent negatives include no blurred vision. Risk factors for coronary artery disease include sedentary lifestyle, obesity, stress and post-menopausal state. There are no compliance problems.  Hypertensive end-organ damage includes kidney disease.     Past Medical History:  Diagnosis Date   Anemia    "in past as a young girl"   Anxiety    Arthritis    Asthma    Bronchitis    hx of   Cataract    bilaterally, "no surgery at this time"   Diabetes mellitus without complication (HCC)    pre diabetic   Dysrhythmia    when gets excited   GERD (gastroesophageal reflux disease)    Headache(784.0)    hx of migraines   History of hiatal hernia    Hyperlipidemia    Hypertension    sees Dr. Sharyn Lull   Neuromuscular disorder (HCC)    hx of carpal tunnel "never had surgery for"right hand     Family History  Problem Relation Age of Onset   Heart attack Mother    Heart attack Father    Stroke Father    Stroke Daughter    Colon cancer Sister      Current Outpatient Medications:    acetaminophen (TYLENOL) 500 MG tablet, Take 500 mg by mouth every 6  (six) hours as needed for moderate pain or headache., Disp: , Rfl:    amLODipine (NORVASC) 2.5 MG tablet, Take 2.5 mg by mouth daily., Disp: , Rfl:    Ascorbic Acid (VITAMIN C PO), Take 1 tablet by mouth daily., Disp: , Rfl:    aspirin EC 81 MG tablet, Take 81 mg by mouth daily., Disp: , Rfl:    atorvastatin (LIPITOR) 40 MG tablet, Take 40 mg by mouth daily., Disp: , Rfl:    azelastine (ASTELIN) 0.1 % nasal spray, Place 1 spray into both nostrils daily. Use in each nostril as directed, Disp: 30 mL, Rfl: 5   cholecalciferol (VITAMIN D) 400 units TABS tablet, Take 600 Units by mouth daily., Disp: , Rfl:    ciprofloxacin-dexamethasone (CIPRODEX) OTIC suspension, Place 4 drops into both ears 2 (two) times daily. (Patient not taking: Reported on 05/20/2023), Disp: 7.5 mL, Rfl: 1   Cyanocobalamin (B-12 PO), Take 500 mcg by mouth daily., Disp: , Rfl:    esomeprazole (NEXIUM) 20 MG capsule, Take 20 mg by mouth as needed (for heartburn)., Disp: , Rfl:    fluticasone (FLONASE) 50 MCG/ACT nasal spray, PLACE 2 SPRAYS INTO BOTH NOSTRILS DAILY AS NEEDED FOR ALLERGIES OR RHINITIS., Disp: 48 mL, Rfl: 1   hydrochlorothiazide (HYDRODIURIL) 12.5 MG tablet, TAKE 1 TABLET BY  MOUTH EVERY DAY, Disp: 90 tablet, Rfl: 1   meclizine (ANTIVERT) 25 MG tablet, Take 12.5 mg by mouth 3 (three) times daily as needed for dizziness., Disp: , Rfl:    metoprolol succinate (TOPROL-XL) 50 MG 24 hr tablet, Take 50 mg by mouth daily., Disp: , Rfl:    Multiple Vitamins-Minerals (ECHINACEA ACZ PO), Take 1 capsule by mouth as needed (for immune support)., Disp: , Rfl:    nitroGLYCERIN (NITROSTAT) 0.4 MG SL tablet, Place 0.4 mg under the tongue every 5 (five) minutes as needed for chest pain., Disp: , Rfl:    Polyethyl Glycol-Propyl Glycol 0.4-0.3 % SOLN, Apply 1 drop to eye 2 (two) times a day. systane, Disp: , Rfl:    telmisartan (MICARDIS) 20 MG tablet, TAKE 1 TABLET BY MOUTH EVERY DAY, Disp: 90 tablet, Rfl: 1   VENTOLIN HFA 108 (90 Base)  MCG/ACT inhaler, INHALE 2 PUFFS INTO THE LUNGS EVERY 4 HOURS AS NEEDED FOR WHEEZING OR SHORTNESS OF BREATH., Disp: 18 each, Rfl: 1   vitamin E 400 UNIT capsule, Take 400 Units by mouth daily., Disp: , Rfl:    Allergies  Allergen Reactions   Codeine Nausea Only and Other (See Comments)    Reaction:Dizziness and hallucinations "makes me climb walls"   Doxycycline Other (See Comments)     Review of Systems  Constitutional: Negative.   Eyes:  Negative for blurred vision.  Respiratory: Negative.    Cardiovascular: Negative.   Gastrointestinal:  Positive for diarrhea.       She c/o intermittent diarrhea. She went to ER Feb 2024 for similar sx. No fever/chills. She has found that dairy products exacerbate her sx.   Musculoskeletal:  Positive for neck pain.       States she has knot on her neck that she wants looked at.  There is some discomfort.   Neurological: Negative.   Psychiatric/Behavioral: Negative.       Today's Vitals   05/20/23 1142 05/20/23 1158  BP: (!) 150/90 (!) 130/90  Pulse: 85   Temp: 97.7 F (36.5 C)   SpO2: 98%   Weight: 158 lb (71.7 kg)   Height: 5\' 2"  (1.575 m)    Body mass index is 28.9 kg/m.  Wt Readings from Last 3 Encounters:  05/20/23 158 lb (71.7 kg)  05/20/23 158 lb (71.7 kg)  11/19/22 159 lb 8 oz (72.3 kg)     Objective:  Physical Exam Vitals and nursing note reviewed.  Constitutional:      Appearance: Normal appearance.  HENT:     Head: Normocephalic and atraumatic.  Eyes:     Extraocular Movements: Extraocular movements intact.  Cardiovascular:     Rate and Rhythm: Normal rate and regular rhythm.     Heart sounds: Normal heart sounds.  Pulmonary:     Effort: Pulmonary effort is normal.     Breath sounds: Normal breath sounds.  Abdominal:     General: Bowel sounds are normal.     Palpations: Abdomen is soft.     Tenderness: There is no abdominal tenderness. There is no guarding.  Musculoskeletal:     Cervical back: Normal range of  motion. Tenderness present.  Skin:    General: Skin is warm.  Neurological:     General: No focal deficit present.     Mental Status: She is alert.  Psychiatric:        Mood and Affect: Mood normal.        Behavior: Behavior normal.  Assessment And Plan:  Hypertensive nephropathy Assessment & Plan: Chronic, uncontrolled.  Importance of medication and dietary compliance was discussed with the patient. She will continue with amlodipine 2.5mg  daily, metoprolol XL 50mg  daily, telmisartan 20mg   and hydrochlorothiazide 12.5mg  daily. Will consider increasing telmisartan to 40mg  daily, in hopes that we can d/c amlodipine to lessen her pill burden.   Orders: -     CMP14+EGFR  Chronic renal disease, stage II Assessment & Plan: Chronic, I will check labs as listed below. Encouraged to stay well hydrated and keep BP controlled to decrease risk of CKD progression.      Loose stools Assessment & Plan: She appears to be lactose intolerant. She is encouraged to avoid foods that exacerbate her sx. If sx are persistent, will consider g/I referral for further evaluation.   Orders: -     CBC with Differential/Platelet  Cervicalgia Assessment & Plan: Chronic, intermittent. She has been evaluated by Orthopedics in the past. Area of concern is her spine. Advised to apply Voltaren gel topically twice daily as needed. She will let me know if her sx persist.    Comedone Assessment & Plan: At tne end of her visit, she requested Derm referral for comedone extraction. She is advised to apply warm compresses to affected lesion. She is aware it will take 2-3 months before an appointment will be scheduled.   Orders: -     Ambulatory referral to Dermatology  Other abnormal glucose Assessment & Plan: Previous labs reviewed, her A1c has been elevated in the past. I will check an A1c today. Reminded to avoid refined sugars including sugary drinks/foods and processed meats including bacon, sausages  and deli meats.    Orders: -     CMP14+EGFR -     Hemoglobin A1c  Polypharmacy -     Vitamin B12     Return for 4 month bpc.  Patient was given opportunity to ask questions. Patient verbalized understanding of the plan and was able to repeat key elements of the plan. All questions were answered to their satisfaction.    I, Gwynneth Aliment, MD, have reviewed all documentation for this visit. The documentation on 05/20/23 for the exam, diagnosis, procedures, and orders are all accurate and complete.   IF YOU HAVE BEEN REFERRED TO A SPECIALIST, IT MAY TAKE 1-2 WEEKS TO SCHEDULE/PROCESS THE REFERRAL. IF YOU HAVE NOT HEARD FROM US/SPECIALIST IN TWO WEEKS, PLEASE GIVE [place] A CALL AT [phone].   THE PATIENT IS ENCOURAGED TO PRACTICE SOCIAL DISTANCING DUE TO THE COVID-19 PANDEMIC.

## 2023-05-20 NOTE — Progress Notes (Signed)
Subjective:   Paula Pollard is a 89 y.o. female who presents for Medicare Annual (Subsequent) preventive examination.  Visit Complete: In person    Cardiac Risk Factors include: advanced age (>37men, >55 women);hypertension     Objective:    Today's Vitals   05/20/23 1138 05/20/23 1157  BP: (!) 150/90 (!) 130/90  Pulse: 85   Temp: 97.7 F (36.5 C)   TempSrc: Oral   SpO2: 99%   Weight: 158 lb (71.7 kg)   Height: 5\' 2"  (1.575 m)    Body mass index is 28.9 kg/m.     05/20/2023   11:46 AM 05/14/2022   11:16 AM 07/15/2021    1:07 PM 04/23/2021   10:10 AM 12/29/2020    6:40 AM 04/18/2020   11:13 AM 04/12/2019    9:30 AM  Advanced Directives  Does Patient Have a Medical Advance Directive? Yes Yes No Yes Yes Yes Yes  Type of Estate agent of [place];Living will Healthcare Power of [place];Living will  Healthcare Power of [place];Living will Healthcare Power of [place];Living will Healthcare Power of [place];Living will Healthcare Power of [place];Living will  Does patient want to make changes to medical advance directive?     No - Patient declined    Copy of Healthcare Power of Attorney in Chart? No - copy requested No - copy requested  No - copy requested No - copy requested No - copy requested No - copy requested    Current Medications (verified) Outpatient Encounter Medications as of 05/20/2023  Medication Sig   acetaminophen (TYLENOL) 500 MG tablet Take 500 mg by mouth every 6 (six) hours as needed for moderate pain or headache.   amLODipine (NORVASC) 2.5 MG tablet Take 2.5 mg by mouth daily.   Ascorbic Acid (VITAMIN C PO) Take 1 tablet by mouth daily.   aspirin EC 81 MG tablet Take 81 mg by mouth daily.   atorvastatin (LIPITOR) 40 MG tablet Take 40 mg by mouth daily.   azelastine (ASTELIN) 0.1 % nasal spray Place 1 spray into both nostrils daily. Use in each nostril as directed   cholecalciferol (VITAMIN D) 400 units TABS tablet Take 600  Units by mouth daily.   Cyanocobalamin (B-12 PO) Take 500 mcg by mouth daily.   esomeprazole (NEXIUM) 20 MG capsule Take 20 mg by mouth as needed (for heartburn).   fluticasone (FLONASE) 50 MCG/ACT nasal spray PLACE 2 SPRAYS INTO BOTH NOSTRILS DAILY AS NEEDED FOR ALLERGIES OR RHINITIS.   hydrochlorothiazide (HYDRODIURIL) 12.5 MG tablet TAKE 1 TABLET BY MOUTH EVERY DAY   meclizine (ANTIVERT) 25 MG tablet Take 12.5 mg by mouth 3 (three) times daily as needed for dizziness.   metoprolol succinate (TOPROL-XL) 50 MG 24 hr tablet Take 50 mg by mouth daily.   Multiple Vitamins-Minerals (ECHINACEA ACZ PO) Take 1 capsule by mouth as needed (for immune support).   nitroGLYCERIN (NITROSTAT) 0.4 MG SL tablet Place 0.4 mg under the tongue every 5 (five) minutes as needed for chest pain.   Polyethyl Glycol-Propyl Glycol 0.4-0.3 % SOLN Apply 1 drop to eye 2 (two) times a day. systane   telmisartan (MICARDIS) 20 MG tablet TAKE 1 TABLET BY MOUTH EVERY DAY   VENTOLIN HFA 108 (90 Base) MCG/ACT inhaler INHALE 2 PUFFS INTO THE LUNGS EVERY 4 HOURS AS NEEDED FOR WHEEZING OR SHORTNESS OF BREATH.   vitamin E 400 UNIT capsule Take 400 Units by mouth daily.   ciprofloxacin-dexamethasone (CIPRODEX) OTIC suspension Place 4 drops into both ears  2 (two) times daily. (Patient not taking: Reported on 05/20/2023)   [DISCONTINUED] Calcium Carbonate-Vitamin D (CALCIUM-VITAMIN D3 PO) Take 1 tablet by mouth every morning. 1200mg  of calcium   No facility-administered encounter medications on file as of 05/20/2023.    Allergies (verified) Codeine and Doxycycline   History: Past Medical History:  Diagnosis Date   Anemia    "in past as a young girl"   Anxiety    Arthritis    Asthma    Bronchitis    hx of   Cataract    bilaterally, "no surgery at this time"   Diabetes mellitus without complication (HCC)    pre diabetic   Dysrhythmia    when gets excited   GERD (gastroesophageal reflux disease)    Headache(784.0)    hx  of migraines   History of hiatal hernia    Hyperlipidemia    Hypertension    sees Dr. Sharyn Lull   Neuromuscular disorder [facility])    hx of carpal tunnel "never had surgery for"right hand   Past Surgical History:  Procedure Laterality Date   CARDIAC CATHETERIZATION     5 years ago no problems   COLONOSCOPY  07/2018   polypectomy x 5   DILATATION & CURETTAGE/HYSTEROSCOPY WITH MYOSURE N/A 01/06/2018   Procedure: DILATATION & CURETTAGE/HYSTEROSCOPY WITH MYOSURE;  Surgeon: Maxie Better, MD;  Location: [facility];  Service: Gynecology;  Laterality: N/A;   DOPPLER ECHOCARDIOGRAPHY     hx of   NECK SURGERY     2002, cyst removed,    removal of toe nails     both feet   TOTAL HIP ARTHROPLASTY Right 08/08/2012   Dr Turner Daniels   TOTAL HIP ARTHROPLASTY Right 08/08/2012   Procedure: TOTAL HIP ARTHROPLASTY;  Surgeon: Nestor Lewandowsky, MD;  Location: [facility] OR;  Service: Orthopedics;  Laterality: Right;   TUBAL LIGATION     1970   Family History  Problem Relation Age of Onset   Heart attack Mother    Heart attack Father    Stroke Father    Stroke Daughter    Colon cancer Sister    Social History   Socioeconomic History   Marital status: Divorced    Spouse name: Not on file   Number of children: Not on file   Years of education: Not on file   Highest education level: Not on file  Occupational History   Occupation: retired  Tobacco Use   Smoking status: Never   Smokeless tobacco: Never  Vaping Use   Vaping status: Never Used  Substance and Sexual Activity   Alcohol use: No   Drug use: No   Sexual activity: Not Currently  Other Topics Concern   Not on file  Social History Narrative   Not on file   Social Drivers of Health   Financial Resource Strain: Low Risk  (05/20/2023)   Overall Financial Resource Strain (CARDIA)    Difficulty of Paying Living Expenses: Not hard at all  Food Insecurity: No Food Insecurity (05/20/2023)   Hunger Vital Sign    Worried About Running Out of Food in the  Last Year: Never true    Ran Out of Food in the Last Year: Never true  Transportation Needs: No Transportation Needs (05/20/2023)   PRAPARE - Administrator, Civil Service (Medical): No    Lack of Transportation (Non-Medical): No  Physical Activity: Insufficiently Active (05/20/2023)   Exercise Vital Sign    Days of Exercise per Week: 7 days  Minutes of Exercise per Session: 20 min  Stress: No Stress Concern Present (05/20/2023)   [employer] of Occupational Health - Occupational Stress Questionnaire    Feeling of Stress : Not at all  Social Connections: Socially Isolated (05/20/2023)   Social Connection and Isolation Panel [NHANES]    Frequency of Communication with Friends and Family: More than three times a week    Frequency of Social Gatherings with Friends and Family: More than three times a week    Attends Religious Services: Never    Database administrator or Organizations: No    Attends Engineer, structural: Never    Marital Status: Divorced    Tobacco Counseling Counseling given: Not Answered   Clinical Intake:  Pre-visit preparation completed: Yes  Pain : No/denies pain     Nutritional Risks: Nausea/ vomitting/ diarrhea (diarrhea last night) Diabetes: No  How often do you need to have someone help you when you read instructions, pamphlets, or other written materials from your doctor or pharmacy?: 1 - Never  Interpreter Needed?: No  Information entered by :: NAllen LPN   Activities of Daily Living    05/20/2023   11:39 AM  In your present state of health, do you have any difficulty performing the following activities:  Hearing? 0  Vision? 0  Difficulty concentrating or making decisions? 0  Walking or climbing stairs? 0  Dressing or bathing? 0  Doing errands, shopping? 0  Preparing Food and eating ? N  Using the Toilet? N  In the past six months, have you accidently leaked urine? N  Do you have problems with loss of bowel  control? N  Managing your Medications? N  Managing your Finances? N  Housekeeping or managing your Housekeeping? N    Patient Care Team: Dorothyann Peng, MD as PCP - General (Internal Medicine) Jethro Bolus, MD as Consulting Physician (Ophthalmology)  Indicate any recent Medical Services you may have received from other than [facility] providers in the past year (date may be approximate).     Assessment:   This is a routine wellness examination for [place].  Hearing/Vision screen Hearing Screening - Comments:: Denies hearing issues Vision Screening - Comments:: Regular eye exams, Dr. Joseph Art   Goals Addressed             This Visit's Progress    Patient Stated       05/20/2023, do more around the house and read more       Depression Screen    05/20/2023   11:48 AM 11/16/2022   10:52 AM 05/14/2022   11:18 AM 04/23/2021   10:12 AM 04/18/2020   11:14 AM 10/04/2019    2:58 PM 04/12/2019    9:30 AM  PHQ 2/9 Scores  PHQ - 2 Score 0 0 0 0 1 0 0  PHQ- 9 Score     1  0    Fall Risk    05/20/2023   11:47 AM 05/14/2022   11:17 AM 04/23/2021   10:11 AM 04/18/2020   11:13 AM 10/04/2019    2:58 PM  Fall Risk   Falls in the past year? 0 1 1 0 0  Comment  tripped hit corner of a wall    Number falls in past yr: 0 0 0  0  Injury with Fall? 0 0 1  0  Comment   cut head    Risk for fall due to : Medication side effect Medication side effect Medication side effect Medication  side effect   Follow up Falls prevention discussed;Falls evaluation completed Falls evaluation completed;Education provided;Falls prevention discussed Falls evaluation completed;Education provided;Falls prevention discussed Falls evaluation completed;Education provided;Falls prevention discussed     MEDICARE RISK AT HOME: Medicare Risk at Home Any stairs in or around the home?: Yes If so, are there any without handrails?: No Home free of loose throw rugs in walkways, pet beds, electrical cords, etc?:  Yes Adequate lighting in your home to reduce risk of falls?: Yes Life alert?: No Use of a cane, walker or w/c?: No Grab bars in the bathroom?: Yes Shower chair or bench in shower?: Yes Elevated toilet seat or a handicapped toilet?: Yes  TIMED UP AND GO:  Was the test performed?  Yes  Length of time to ambulate 10 feet: 7 sec Gait slow and steady without use of assistive device    Cognitive Function:        05/20/2023   11:48 AM 05/14/2022   11:19 AM 04/23/2021   10:14 AM 04/18/2020   11:17 AM 04/12/2019    9:33 AM  6CIT Screen  What Year? 0 points 0 points 0 points 0 points 0 points  What month? 0 points 0 points 0 points 0 points 0 points  What time? 0 points 0 points 0 points 0 points 0 points  Count back from 20 0 points 0 points 0 points 0 points 0 points  Months in reverse 4 points 4 points 2 points 2 points 2 points  Repeat phrase 4 points 4 points 4 points 4 points 2 points  Total Score 8 points 8 points 6 points 6 points 4 points    Immunizations Immunization History  Administered Date(s) Administered   Moderna Sars-Covid-2 Vaccination 07/23/2019, 08/21/2019   PNEUMOCOCCAL CONJUGATE-20 10/27/2021   Pneumococcal Polysaccharide-23 04/18/2020   Tdap 05/14/2022    TDAP status: Up to date  Flu Vaccine status: Declined, Education has been provided regarding the importance of this vaccine but patient still declined. Advised may receive this vaccine at local pharmacy or Health Dept. Aware to provide a copy of the vaccination record if obtained from local pharmacy or Health Dept. Verbalized acceptance and understanding.  Pneumococcal vaccine status: Up to date  Covid-19 vaccine status: Information provided on how to obtain vaccines.   Qualifies for Shingles Vaccine? Yes   Zostavax completed No   Shingrix Completed?: No.    Education has been provided regarding the importance of this vaccine. Patient has been advised to call insurance company to determine out of  pocket expense if they have not yet received this vaccine. Advised may also receive vaccine at local pharmacy or Health Dept. Verbalized acceptance and understanding.  Screening Tests Health Maintenance  Topic Date Due   COVID-19 Vaccine (3 - Moderna risk series) 06/05/2023 (Originally 09/18/2019)   Zoster Vaccines- Shingrix (1 of 2) 08/18/2023 (Originally 02/03/1949)   INFLUENZA VACCINE  09/06/2023 (Originally 01/07/2023)   MAMMOGRAM  05/04/2024   Medicare Annual Wellness (AWV)  05/19/2024   DTaP/Tdap/Td (2 - Td or Tdap) 05/14/2032   Pneumonia Vaccine 40+ Years old  Completed   DEXA SCAN  Completed   HPV VACCINES  Aged Out    Health Maintenance  There are no preventive care reminders to display for this patient.   Colorectal cancer screening: No longer required.   Mammogram status: Completed 05/05/2023. Repeat every year  Bone Density status: Completed 01/17/2018.   Lung Cancer Screening: (Low Dose CT Chest recommended if Age 28-80 years, 20 pack-year currently smoking OR  have quit w/in 15years.) does not qualify.   Lung Cancer Screening Referral: no  Additional Screening:  Hepatitis C Screening: does not qualify;   Vision Screening: Recommended annual ophthalmology exams for early detection of glaucoma and other disorders of the eye. Is the patient up to date with their annual eye exam?  Yes  Who is the provider or what is the name of the office in which the patient attends annual eye exams? Dr. Joseph Art If pt is not established with a provider, would they like to be referred to a provider to establish care? No .   Dental Screening: Recommended annual dental exams for proper oral hygiene  Diabetic Foot Exam: n/a  Community Resource Referral / Chronic Care Management: CRR required this visit?  No   CCM required this visit?  No     Plan:     I have personally reviewed and noted the following in the patient's chart:   Medical and social history Use of alcohol, tobacco  or illicit drugs  Current medications and supplements including opioid prescriptions. Patient is not currently taking opioid prescriptions. Functional ability and status Nutritional status Physical activity Advanced directives List of other physicians Hospitalizations, surgeries, and ER visits in previous 12 months Vitals Screenings to include cognitive, depression, and falls Referrals and appointments  In addition, I have reviewed and discussed with patient certain preventive protocols, quality metrics, and best practice recommendations. A written personalized care plan for preventive services as well as general preventive health recommendations were provided to patient.     Barb Merino, LPN   [id number]   After Visit Summary: (In Person-Printed) AVS printed and given to the patient  Nurse Notes: none

## 2023-05-20 NOTE — Patient Instructions (Signed)
Ms. Kirgan , Thank you for taking time to come for your Medicare Wellness Visit. I appreciate your ongoing commitment to your health goals. Please review the following plan we discussed and let me know if I can assist you in the future.   Referrals/Orders/Follow-Ups/Clinician Recommendations: no  This is a list of the screening recommended for you and due dates:  Health Maintenance  Topic Date Due   COVID-19 Vaccine (3 - Moderna risk series) 06/05/2023*   Zoster (Shingles) Vaccine (1 of 2) 08/18/2023*   Flu Shot  09/06/2023*   Mammogram  05/04/2024   Medicare Annual Wellness Visit  05/19/2024   DTaP/Tdap/Td vaccine (2 - Td or Tdap) 05/14/2032   Pneumonia Vaccine  Completed   DEXA scan (bone density measurement)  Completed   HPV Vaccine  Aged Out  *Topic was postponed. The date shown is not the original due date.    Advanced directives: (Copy Requested) Please bring a copy of your health care power of attorney and living will to the office to be added to your chart at your convenience.  Next Medicare Annual Wellness Visit scheduled for next year: No, office will schedule  Insert Preventive Care attachment Insert FALL PREVENTION attachment if needed

## 2023-05-20 NOTE — Patient Instructions (Signed)
Hypertension, Adult Hypertension is another name for high blood pressure. High blood pressure forces your heart to work harder to pump blood. This can cause problems over time. There are two numbers in a blood pressure reading. There is a top number (systolic) over a bottom number (diastolic). It is best to have a blood pressure that is below 120/80. What are the causes? The cause of this condition is not known. Some other conditions can lead to high blood pressure. What increases the risk? Some lifestyle factors can make you more likely to develop high blood pressure: Smoking. Not getting enough exercise or physical activity. Being overweight. Having too much fat, sugar, calories, or salt (sodium) in your diet. Drinking too much alcohol. Other risk factors include: Having any of these conditions: Heart disease. Diabetes. High cholesterol. Kidney disease. Obstructive sleep apnea. Having a family history of high blood pressure and high cholesterol. Age. The risk increases with age. Stress. What are the signs or symptoms? High blood pressure may not cause symptoms. Very high blood pressure (hypertensive crisis) may cause: Headache. Fast or uneven heartbeats (palpitations). Shortness of breath. Nosebleed. Vomiting or feeling like you may vomit (nauseous). Changes in how you see. Very bad chest pain. Feeling dizzy. Seizures. How is this treated? This condition is treated by making healthy lifestyle changes, such as: Eating healthy foods. Exercising more. Drinking less alcohol. Your doctor may prescribe medicine if lifestyle changes do not help enough and if: Your top number is above 130. Your bottom number is above 80. Your personal target blood pressure may vary. Follow these instructions at home: Eating and drinking  If told, follow the DASH eating plan. To follow this plan: Fill one half of your plate at each meal with fruits and vegetables. Fill one fourth of your plate  at each meal with whole grains. Whole grains include whole-wheat pasta, brown rice, and whole-grain bread. Eat or drink low-fat dairy products, such as skim milk or low-fat yogurt. Fill one fourth of your plate at each meal with low-fat (lean) proteins. Low-fat proteins include fish, chicken without skin, eggs, beans, and tofu. Avoid fatty meat, cured and processed meat, or chicken with skin. Avoid pre-made or processed food. Limit the amount of salt in your diet to less than 1,500 mg each day. Do not drink alcohol if: Your doctor tells you not to drink. You are pregnant, may be pregnant, or are planning to become pregnant. If you drink alcohol: Limit how much you have to: 0-1 drink a day for women. 0-2 drinks a day for men. Know how much alcohol is in your drink. In the [place] one drink equals one 12 oz bottle of beer (355 mL), one 5 oz glass of wine (148 mL), or one 1 oz glass of hard liquor (44 mL). Lifestyle  Work with your doctor to stay at a healthy weight or to lose weight. Ask your doctor what the best weight is for you. Get at least 30 minutes of exercise that causes your heart to beat faster (aerobic exercise) most days of the week. This may include walking, swimming, or biking. Get at least 30 minutes of exercise that strengthens your muscles (resistance exercise) at least 3 days a week. This may include lifting weights or doing Pilates. Do not smoke or use any products that contain nicotine or tobacco. If you need help quitting, ask your doctor. Check your blood pressure at home as told by your doctor. Keep all follow-up visits. Medicines Take over-the-counter and prescription medicines   only as told by your doctor. Follow directions carefully. Do not skip doses of blood pressure medicine. The medicine does not work as well if you skip doses. Skipping doses also puts you at risk for problems. Ask your doctor about side effects or reactions to medicines that you should watch  for. Contact a doctor if: You think you are having a reaction to the medicine you are taking. You have headaches that keep coming back. You feel dizzy. You have swelling in your ankles. You have trouble with your vision. Get help right away if: You get a very bad headache. You start to feel mixed up (confused). You feel weak or numb. You feel faint. You have very bad pain in your: Chest. Belly (abdomen). You vomit more than once. You have trouble breathing. These symptoms may be an emergency. Get help right away. Call 911. Do not wait to see if the symptoms will go away. Do not drive yourself to the hospital. Summary Hypertension is another name for high blood pressure. High blood pressure forces your heart to work harder to pump blood. For most people, a normal blood pressure is less than 120/80. Making healthy choices can help lower blood pressure. If your blood pressure does not get lower with healthy choices, you may need to take medicine. This information is not intended to replace advice given to you by your health care provider. Make sure you discuss any questions you have with your health care provider. Document Revised: 03/13/2021 Document Reviewed: 03/13/2021 Elsevier Patient Education  2024 Elsevier Inc.  

## 2023-05-21 LAB — CBC WITH DIFFERENTIAL/PLATELET
Basophils Absolute: 0 10*3/uL (ref 0.0–0.2)
Basos: 1 %
EOS (ABSOLUTE): 0.1 10*3/uL (ref 0.0–0.4)
Eos: 3 %
Hematocrit: 41 % (ref 34.0–46.6)
Hemoglobin: 12.9 g/dL (ref 11.1–15.9)
Immature Grans (Abs): 0 10*3/uL (ref 0.0–0.1)
Immature Granulocytes: 0 %
Lymphocytes Absolute: 1.4 10*3/uL (ref 0.7–3.1)
Lymphs: 32 %
MCH: 29.3 pg (ref 26.6–33.0)
MCHC: 31.5 g/dL (ref 31.5–35.7)
MCV: 93 fL (ref 79–97)
Monocytes Absolute: 0.4 10*3/uL (ref 0.1–0.9)
Monocytes: 9 %
Neutrophils Absolute: 2.4 10*3/uL (ref 1.4–7.0)
Neutrophils: 55 %
Platelets: 213 10*3/uL (ref 150–450)
RBC: 4.4 x10E6/uL (ref 3.77–5.28)
RDW: 12.1 % (ref 11.7–15.4)
WBC: 4.3 10*3/uL (ref 3.4–10.8)

## 2023-05-21 LAB — CMP14+EGFR
ALT: 18 [IU]/L (ref 0–32)
AST: 22 [IU]/L (ref 0–40)
Albumin: 4.2 g/dL (ref 3.6–4.6)
Alkaline Phosphatase: 80 [IU]/L (ref 44–121)
BUN/Creatinine Ratio: 16 (ref 12–28)
BUN: 12 mg/dL (ref 10–36)
Bilirubin Total: 0.5 mg/dL (ref 0.0–1.2)
CO2: 26 mmol/L (ref 20–29)
Calcium: 9.1 mg/dL (ref 8.7–10.3)
Chloride: 99 mmol/L (ref 96–106)
Creatinine, Ser: 0.77 mg/dL (ref 0.57–1.00)
Globulin, Total: 3 g/dL (ref 1.5–4.5)
Glucose: 91 mg/dL (ref 70–99)
Potassium: 3.8 mmol/L (ref 3.5–5.2)
Sodium: 138 mmol/L (ref 134–144)
Total Protein: 7.2 g/dL (ref 6.0–8.5)
eGFR: 73 mL/min/{1.73_m2} (ref >59)

## 2023-05-21 LAB — HEMOGLOBIN A1C
Est. average glucose Bld gHb Est-mCnc: 120 mg/dL
Hgb A1c MFr Bld: 5.8 % — ABNORMAL HIGH (ref 4.8–5.6)

## 2023-05-21 LAB — VITAMIN B12: Vitamin B-12: >2000 pg/mL — ABNORMAL HIGH (ref 232–1245)

## 2023-05-23 DIAGNOSIS — R195 Other fecal abnormalities: Secondary | ICD-10-CM | POA: Insufficient documentation

## 2023-05-23 DIAGNOSIS — L700 Acne vulgaris: Secondary | ICD-10-CM | POA: Insufficient documentation

## 2023-05-23 NOTE — Assessment & Plan Note (Signed)
Chronic, intermittent. She has been evaluated by Orthopedics in the past. Area of concern is her spine. Advised to apply Voltaren gel topically twice daily as needed. She will let me know if her sx persist.

## 2023-05-23 NOTE — Assessment & Plan Note (Signed)
She appears to be lactose intolerant. She is encouraged to avoid foods that exacerbate her sx. If sx are persistent, will consider g/I referral for further evaluation.

## 2023-05-23 NOTE — Assessment & Plan Note (Signed)
Chronic, uncontrolled.  Importance of medication and dietary compliance was discussed with the patient. She will continue with amlodipine 2.5mg  daily, metoprolol XL 50mg  daily, telmisartan 20mg   and hydrochlorothiazide 12.5mg  daily. Will consider increasing telmisartan to 40mg  daily, in hopes that we can d/c amlodipine to lessen her pill burden.

## 2023-05-23 NOTE — Assessment & Plan Note (Signed)
Chronic, I will check labs as listed below. Encouraged to stay well hydrated and keep BP controlled to decrease risk of CKD progression.

## 2023-05-23 NOTE — Assessment & Plan Note (Signed)
Previous labs reviewed, her A1c has been elevated in the past. I will check an A1c today. Reminded to avoid refined sugars including sugary drinks/foods and processed meats including bacon, sausages and deli meats.  

## 2023-05-23 NOTE — Assessment & Plan Note (Addendum)
At tne end of her visit, she requested Derm referral for comedone extraction. She is advised to apply warm compresses to affected lesion. She is aware it will take 2-3 months before an appointment will be scheduled.

## 2023-05-25 ENCOUNTER — Encounter: Payer: Self-pay | Admitting: Allergy & Immunology

## 2023-05-25 ENCOUNTER — Ambulatory Visit (INDEPENDENT_AMBULATORY_CARE_PROVIDER_SITE_OTHER): Payer: Medicare Other | Admitting: Allergy & Immunology

## 2023-05-25 ENCOUNTER — Other Ambulatory Visit: Payer: Self-pay

## 2023-05-25 VITALS — BP 118/66 | HR 89 | Temp 97.9°F | Resp 16 | Ht 60.83 in | Wt 161.9 lb

## 2023-05-25 DIAGNOSIS — J4540 Moderate persistent asthma, uncomplicated: Secondary | ICD-10-CM | POA: Diagnosis not present

## 2023-05-25 DIAGNOSIS — R42 Dizziness and giddiness: Secondary | ICD-10-CM

## 2023-05-25 DIAGNOSIS — J3089 Other allergic rhinitis: Secondary | ICD-10-CM

## 2023-05-25 NOTE — Progress Notes (Signed)
FOLLOW UP  Date of Service/Encounter:  05/25/23   Assessment:   Mild intermittent asthma without complication - doing very well on the Breztri   Allergic rhinitis   Right ear lesion with bilateral otalgia - patient scheduled to follow up with Dr. Suszanne Conners on Friday   Vertigo    Plan/Recommendations:   1. Mild intermittent asthma without complication - Lung testing looked AMAZING today. - We will not make any changes at this time since you seem to be doing very well.   - We can send you some Ventolin in the future if you need it.  - Daily controller medication(s): Breztri two puffs one to two times daily with spacer  - Prior to physical activity: Ventolin 2 puffs 10-15 minutes before physical activity. - Rescue medications: Ventolin 4 puffs every 4-6 hours as needed - Asthma control goals:  * Full participation in all desired activities (may need albuterol before activity) * Albuterol use two time or less a week on average (not counting use with activity) * Cough interfering with sleep two time or less a month * Oral steroids no more than once a year * No hospitalizations  2. Allergic rhinitis - Continue with Dymista 1-2 sprays per nostril twice daily.  - Continue with Pataday one drop per eye twice daily.   3. Vertigo - Continue to follow with Dr. Suszanne Conners as needed.   4. Return in about 6 months (around 11/23/2023).     Subjective:   Paula Pollard is a 89 y.o. female presenting today for follow up of  Chief Complaint  Patient presents with   Follow-up    Paula Pollard has a history of the following: Patient Active Problem List   Diagnosis Date Noted   Loose stools 05/23/2023   Comedone 05/23/2023   Cervicalgia 11/16/2022   Hypertensive nephropathy 04/23/2021   Chronic renal disease, stage II 04/23/2021   Hair loss 04/23/2021   Other abnormal glucose 04/23/2021   Mild intermittent asthma 06/26/2019   Vertigo 10/28/2018   Hypokalemia 10/28/2018    Acute sinusitis 10/05/2017   Other allergic rhinitis 10/05/2017   Asthma with acute exacerbation 03/06/2015   Unexplained night sweats 03/06/2015   Cough 02/16/2015   Essential hypertension, benign 08/25/2012   Other and unspecified hyperlipidemia 08/25/2012   Benign paroxysmal positional vertigo 08/25/2012   Constipation 08/25/2012   Generalized anxiety disorder 08/25/2012   GERD (gastroesophageal reflux disease) 08/25/2012   Osteoarthritis of right hip 08/10/2012    History obtained from: chart review and patient.  Discussed the use of AI scribe software for clinical note transcription with the patient and/or guardian, who gave verbal consent to proceed.  Paula Pollard is a 89 y.o. female presenting for a follow up visit.  She was last seen in June 2024.  At that time, like testing looked great.  We continued with the Breztri 2 puffs twice daily with a spacer.  Since the last visit, she has done fairly well.    Asthma/Respiratory Symptom History: Malaysiah reports a significant improvement in her lung function test, which was in the high 80s. She has been adhering to her prescribed Breztri inhaler regimen, using it nightly. Despite the cost, she has managed to maintain her medication schedule, although she has reduced the frequency to one puff twice a day due to the expense. She reports no need for an emergency inhaler and has not been sick recently.  Allergic Rhinitis Symptom History: She remains on her nasal sprays. This seems to be  working well to control her symptoms.  She is doing very well with this regimen. She has not needed antibiotics at all for her symptoms.   The patient also reports a history of vertigo, which has improved with positional changes during sleep, specifically avoiding sleeping on her back. She has a hip replacement on one side, which causes discomfort when sleeping on that side. She is scheduled to see a specialist for a follow-up on her vertigo.  She is on a small  dose of a benzodiazepine, which she has been taking for a while for anxiety. She has been trying to reduce her dosage, cutting the pills into quarters. She reports having many problems and needing something to calm her down. This seems to be working well.   The patient also reports numbness in one hand, which she has been managing with warmth and daily stretches. She has seen a neurologist in the past but did not find the visits helpful. She also reports issues with one ear, for which she has been prescribed drops. She has an upcoming appointment with Dr. Suszanne Conners to address these issues.  The patient's blood pressure has been fluctuating, but she is unsure why. She has been taking her prescribed medication, but she feels it makes her feel different. Despite this, she reports no swelling in her feet, a common side effect of her medication. She is considering switching back to a previous medication, Symbicort, due to concerns about the effects of her current medication on her heart.   Otherwise, there have been no changes to her past medical history, surgical history, family history, or social history.    Review of systems otherwise negative other than that mentioned in the HPI.    Objective:   Blood pressure 118/66, pulse 89, temperature 97.9 F (36.6 C), temperature source Temporal, resp. rate 16, height 5' 0.83" (1.545 m), weight 161 lb 14.4 oz (73.4 kg), SpO2 96%. Body mass index is 30.76 kg/m.    Physical Exam Vitals reviewed.  Constitutional:      Appearance: She is well-developed.     Comments: Delightful female.  Very interactive. Very sweet!  Talkative.  HENT:     Head: Normocephalic and atraumatic.     Right Ear: Tympanic membrane, ear canal and external ear normal.     Left Ear: Tympanic membrane, ear canal and external ear normal.     Nose: No nasal deformity, septal deviation, mucosal edema or rhinorrhea.     Right Turbinates: Enlarged, swollen and pale.     Left Turbinates:  Enlarged, swollen and pale.     Right Sinus: No maxillary sinus tenderness or frontal sinus tenderness.     Left Sinus: No maxillary sinus tenderness or frontal sinus tenderness.     Mouth/Throat:     Lips: Pink.     Mouth: Mucous membranes are moist. Mucous membranes are not pale and not dry.     Pharynx: Uvula midline.     Comments: Cobblestoning in the posterior oropharynx.  Eyes:     General: Lids are normal. No allergic shiner.       Right eye: No discharge.        Left eye: No discharge.     Conjunctiva/sclera: Conjunctivae normal.     Right eye: Right conjunctiva is not injected. No chemosis.    Left eye: Left conjunctiva is not injected. No chemosis.    Pupils: Pupils are equal, round, and reactive to light.  Cardiovascular:     Rate and Rhythm:  Normal rate and regular rhythm.     Heart sounds: Normal heart sounds.  Pulmonary:     Effort: Pulmonary effort is normal. No tachypnea, accessory muscle usage or respiratory distress.     Breath sounds: Normal breath sounds. No wheezing, rhonchi or rales.     Comments: Moving air well in all lung fields.  No wheezes noted. No increased work of breathing noted.  Chest:     Chest wall: No tenderness.  Lymphadenopathy:     Cervical: No cervical adenopathy.  Skin:    Coloration: Skin is not pale.     Findings: No abrasion, erythema, petechiae or rash. Rash is not papular, urticarial or vesicular.  Neurological:     Mental Status: She is alert.  Psychiatric:        Behavior: Behavior is cooperative.      Diagnostic studies:    Spirometry: results normal (FEV1: 1.15/88%, FVC: 1.52/88%, FEV1/FVC: 76%).    Spirometry consistent with normal pattern.   Allergy Studies: none       Malachi Bonds, MD  [facility]

## 2023-05-25 NOTE — Patient Instructions (Addendum)
1. Mild intermittent asthma without complication - Lung testing looked AMAZING today. - We will not make any changes at this time since you seem to be doing very well.   - We can send you some Ventolin in the future if you need it.  - Daily controller medication(s): Breztri two puffs one to two times daily with spacer  - Prior to physical activity: Ventolin 2 puffs 10-15 minutes before physical activity. - Rescue medications: Ventolin 4 puffs every 4-6 hours as needed - Asthma control goals:  * Full participation in all desired activities (may need albuterol before activity) * Albuterol use two time or less a week on average (not counting use with activity) * Cough interfering with sleep two time or less a month * Oral steroids no more than once a year * No hospitalizations  2. Allergic rhinitis - Continue with Dymista 1-2 sprays per nostril twice daily.  - Continue with Pataday one drop per eye twice daily.   3. Vertigo - Continue to follow with Dr. Suszanne Conners as needed.   4. Return in about 6 months (around 11/23/2023).   Please inform us of any Emergency Department visits, hospitalizations, or changes in symptoms. Call us before going to the ED for breathing or allergy symptoms since we might be able to fit you in for a sick visit. Feel free to contact us anytime with any questions, problems, or concerns.  It was a pleasure to see you again today!  Websites that have reliable patient information: 1. American Academy of Asthma, Allergy, and Immunology: www.aaaai.org 2. Food Allergy Research and Education (FARE): foodallergy.org 3. Mothers of Asthmatics: http://www.asthmacommunitynetwork.org 4. American College of Allergy, Asthma, and Immunology: www.acaai.org      "Like" [place] on Facebook and Instagram for our latest updates!      A healthy democracy works best when [employer] participate! Make sure you are registered to vote! If you have moved or changed any of your contact  information, you will need to get this updated before voting! Scan the QR codes below to learn more!

## 2023-05-28 ENCOUNTER — Encounter (INDEPENDENT_AMBULATORY_CARE_PROVIDER_SITE_OTHER): Payer: Self-pay

## 2023-05-28 ENCOUNTER — Ambulatory Visit (INDEPENDENT_AMBULATORY_CARE_PROVIDER_SITE_OTHER): Payer: Medicare Other | Admitting: Otolaryngology

## 2023-05-28 VITALS — Ht 61.0 in | Wt 158.0 lb

## 2023-05-28 DIAGNOSIS — H9203 Otalgia, bilateral: Secondary | ICD-10-CM | POA: Diagnosis not present

## 2023-05-28 DIAGNOSIS — R42 Dizziness and giddiness: Secondary | ICD-10-CM

## 2023-05-31 DIAGNOSIS — H9203 Otalgia, bilateral: Secondary | ICD-10-CM | POA: Insufficient documentation

## 2023-05-31 DIAGNOSIS — H9201 Otalgia, right ear: Secondary | ICD-10-CM | POA: Insufficient documentation

## 2023-05-31 NOTE — Progress Notes (Signed)
Patient ID: Paula Pollard, female   DOB: Jul 19, 1929, 89 y.o.   MRN: [id number]  Follow-up: Recurrent dizziness, ear pain  HPI: The patient is a 89 year old female who returns today for her follow-up evaluation.  The patient was previously seen for recurrent dizziness.  At her last visit, she was noted to have right benign paroxysmal positional vertigo.  Her right Dix-Hallpike maneuver was positive.  She was treated with right Epley maneuver without difficulty.  In addition, she was also noted to have an acute right otitis externa at her last visit.  She was treated with Ciprodex eardrops.  The patient returns today reporting no more spinning vertigo.  However, she has noted intermittent bilateral otalgia.  She denies any otorrhea or change in her hearing.  She is not on any otologic medication at this time.  Exam: General: Communicates without difficulty, well nourished, no acute distress. Head: Normocephalic, no evidence injury, no tenderness, facial buttresses intact without stepoff. Face/sinus: No tenderness to palpation and percussion. Facial movement is normal and symmetric. Eyes: PERRL, EOMI. No scleral icterus, conjunctivae clear. Neuro: CN II exam reveals vision grossly intact.  No nystagmus at any point of gaze. Ears: Auricles well formed without lesions.  Ear canals are intact without mass or lesion.  No erythema or edema is appreciated.  The TMs are intact without fluid.  Bilateral TMJ mildly tender to touch.  Nose: External evaluation reveals normal support and skin without lesions.  Dorsum is intact.  Anterior rhinoscopy reveals congested mucosa over anterior aspect of inferior turbinates and intact septum.  No purulence noted. Oral:  Oral cavity and oropharynx are intact, symmetric, without erythema or edema.  Mucosa is moist without lesions. Neck: Full range of motion without pain.  There is no significant lymphadenopathy.  No masses palpable.  Thyroid bed within normal limits to palpation.   Parotid glands and submandibular glands equal bilaterally without mass.  Trachea is midline. Neuro:  CN 2-12 grossly intact.   Assessment: 1.  The patient's right benign paroxysmal positional vertigo has resolved.  She is not symptomatic at this time. 2.  Her ear canals, tympanic membranes, and middle ear spaces are normal. 3.  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  Plan: 1.  The physical exam findings are reviewed with the patient. 2.  Robaxin 750 mg tabs (half tab nightly). 3.  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 4.  The patient is encouraged to call with any questions or concerns.

## 2023-06-05 ENCOUNTER — Other Ambulatory Visit: Payer: Self-pay | Admitting: Allergy & Immunology

## 2023-06-10 ENCOUNTER — Other Ambulatory Visit: Payer: Self-pay | Admitting: Allergy & Immunology

## 2023-07-27 DIAGNOSIS — I519 Heart disease, unspecified: Secondary | ICD-10-CM | POA: Diagnosis not present

## 2023-07-27 DIAGNOSIS — I25118 Atherosclerotic heart disease of native coronary artery with other forms of angina pectoris: Secondary | ICD-10-CM | POA: Diagnosis not present

## 2023-07-27 DIAGNOSIS — I38 Endocarditis, valve unspecified: Secondary | ICD-10-CM | POA: Diagnosis not present

## 2023-07-27 DIAGNOSIS — I10 Essential (primary) hypertension: Secondary | ICD-10-CM | POA: Diagnosis not present

## 2023-08-26 ENCOUNTER — Other Ambulatory Visit: Payer: Self-pay | Admitting: Allergy & Immunology

## 2023-08-26 NOTE — Telephone Encounter (Signed)
 Refill for Ventolin HFA x 1 with 2 refills sent to [facility].

## 2023-09-06 ENCOUNTER — Other Ambulatory Visit: Payer: Self-pay | Admitting: Internal Medicine

## 2023-09-23 ENCOUNTER — Ambulatory Visit: Payer: Medicare Other | Admitting: Internal Medicine

## 2023-09-23 ENCOUNTER — Encounter: Payer: Self-pay | Admitting: Internal Medicine

## 2023-09-23 VITALS — BP 124/82 | HR 81 | Temp 98.1°F | Ht 61.0 in | Wt 162.8 lb

## 2023-09-23 DIAGNOSIS — Z6830 Body mass index (BMI) 30.0-30.9, adult: Secondary | ICD-10-CM | POA: Diagnosis not present

## 2023-09-23 DIAGNOSIS — H6121 Impacted cerumen, right ear: Secondary | ICD-10-CM

## 2023-09-23 DIAGNOSIS — Z9989 Dependence on other enabling machines and devices: Secondary | ICD-10-CM

## 2023-09-23 DIAGNOSIS — I10 Essential (primary) hypertension: Secondary | ICD-10-CM | POA: Diagnosis not present

## 2023-09-23 DIAGNOSIS — I129 Hypertensive chronic kidney disease with stage 1 through stage 4 chronic kidney disease, or unspecified chronic kidney disease: Secondary | ICD-10-CM | POA: Diagnosis not present

## 2023-09-23 DIAGNOSIS — R7309 Other abnormal glucose: Secondary | ICD-10-CM

## 2023-09-23 DIAGNOSIS — I38 Endocarditis, valve unspecified: Secondary | ICD-10-CM | POA: Diagnosis not present

## 2023-09-23 DIAGNOSIS — I519 Heart disease, unspecified: Secondary | ICD-10-CM | POA: Diagnosis not present

## 2023-09-23 DIAGNOSIS — E6609 Other obesity due to excess calories: Secondary | ICD-10-CM

## 2023-09-23 DIAGNOSIS — E66811 Obesity, class 1: Secondary | ICD-10-CM

## 2023-09-23 DIAGNOSIS — N182 Chronic kidney disease, stage 2 (mild): Secondary | ICD-10-CM | POA: Diagnosis not present

## 2023-09-23 DIAGNOSIS — L308 Other specified dermatitis: Secondary | ICD-10-CM

## 2023-09-23 DIAGNOSIS — I25118 Atherosclerotic heart disease of native coronary artery with other forms of angina pectoris: Secondary | ICD-10-CM | POA: Diagnosis not present

## 2023-09-23 DIAGNOSIS — J4520 Mild intermittent asthma, uncomplicated: Secondary | ICD-10-CM | POA: Diagnosis not present

## 2023-09-23 MED ORDER — CLOTRIMAZOLE-BETAMETHASONE 1-0.05 % EX CREA: 1.0000 | TOPICAL_CREAM | Freq: Two times a day (BID) | CUTANEOUS | 0 refills | Status: AC

## 2023-09-23 NOTE — Progress Notes (Signed)
 I,Victoria T Basil Lim, CMA,acting as a Neurosurgeon for Smiley Dung, MD.,have documented all relevant documentation on the behalf of Smiley Dung, MD,as directed by  Smiley Dung, MD while in the presence of Smiley Dung, MD.  Subjective:  Patient ID: Paula Pollard , female    DOB: Oct 18, 1929 , 89 y.o.   MRN: [id number]  Chief Complaint  Patient presents with   Hypertension    Patient presents today for bpc. She reports compliance with medication. Denies headache, chest pain & sob. She recently noticed a rash on the back of her neck & back. At home she has used a cream. It has helped improve rash.     HPI Discussed the use of AI scribe software for clinical note transcription with the patient, who gave verbal consent to proceed.  History of Present Illness Paula Pollard is a 89 year old female with hypertension who presents for a blood pressure check.  She has consistently high blood pressure readings at home, approximately 159/96 mmHg in the mornings, despite a reading of 124/82 mmHg in the office today. She uses a home blood pressure machine and is concerned about its accuracy. She plans to bring the machine for calibration but is going out of town to assist her ill brother. She is currently taking amlodipine 2.5 mg every other day due to pedal edema when taken daily. Her blood pressure improves on the days she takes amlodipine, but she experiences swelling. She also takes baby aspirin , Lipitor 40 mg, azelastine  nasal spray, Nexium as needed for heartburn, Flonase  nasal spray, hydrochlorothiazide , metoprolol 50 mg, nitroglycerin as needed, and telmisartan 20 mg.  She admits she has not needed to use sl NTG recently.   She experiences 'noises' in her chest, described as a different sound, not a whistling noise typical of asthma. She uses an inhaler every night before bed, although it was prescribed as needed. She has not been using Breztri as prescribed due to disliking the sample  she tried. No wheezing today, but she reports hearing a whistling sound at night, which she associates with allergy season.  She reports dizziness when lying on her right ear, which she attributes to needing ear flushing. She prefers to use over-the-counter drops instead of having her ear flushed today. 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 She has not seen her ear doctor this year but recalls a visit in December.  She has a rash on her chest and neck, which she attributes to sweating and irritation from her hair. She uses a cream previously prescribed for her scalp, which she believes has helped.   Hypertension This is a chronic problem. The current episode started more than 1 year ago. The problem has been gradually improving since onset. The problem is controlled. Associated symptoms include neck pain. Pertinent negatives include no blurred vision. Risk factors for coronary artery disease include sedentary lifestyle, obesity, stress and post-menopausal state. There are no compliance problems.  Hypertensive end-organ damage includes kidney disease.     Past Medical History:  Diagnosis Date   Anemia    "in past as a young girl"   Anxiety    Arthritis    Asthma    Bronchitis    hx of   Cataract    bilaterally, "no surgery at this time"   Diabetes mellitus without complication (HCC)    pre diabetic   Dysrhythmia  when gets excited   GERD (gastroesophageal reflux disease)    Headache(784.0)    hx of migraines   History of hiatal hernia    Hyperlipidemia    Hypertension    sees Dr. Glena Landau   Neuromuscular disorder [facility])    hx of carpal tunnel "never had surgery for"right hand     Family History  Problem Relation Age of Onset   Heart attack Mother    Heart attack Father    Stroke Father    Stroke Daughter    Colon cancer Sister      Current Outpatient Medications:    acetaminophen   (TYLENOL ) 500 MG tablet, Take 500 mg by mouth every 6 (six) hours as needed for moderate pain or headache., Disp: , Rfl:    amLODipine (NORVASC) 2.5 MG tablet, Take 2.5 mg by mouth daily., Disp: , Rfl:    Ascorbic Acid (VITAMIN C PO), Take 1 tablet by mouth daily., Disp: , Rfl:    atorvastatin  (LIPITOR) 40 MG tablet, Take 40 mg by mouth daily., Disp: , Rfl:    Azelastine  HCl 137 MCG/SPRAY SOLN, PLACE 1 SPRAY INTO BOTH NOSTRILS DAILY. USE IN EACH NOSTRIL AS DIRECTED, Disp: 30 mL, Rfl: 5   cholecalciferol (VITAMIN D ) 400 units TABS tablet, Take 600 Units by mouth daily., Disp: , Rfl:    ciprofloxacin -dexamethasone  (CIPRODEX ) OTIC suspension, Place 4 drops into both ears 2 (two) times daily., Disp: 7.5 mL, Rfl: 1   clotrimazole -betamethasone  (LOTRISONE ) cream, Apply 1 Application topically 2 (two) times daily. As needed, Disp: 30 g, Rfl: 0   Cyanocobalamin  (B-12 PO), Take 500 mcg by mouth daily., Disp: , Rfl:    esomeprazole (NEXIUM) 20 MG capsule, Take 20 mg by mouth as needed (for heartburn)., Disp: , Rfl:    fluticasone  (FLONASE ) 50 MCG/ACT nasal spray, PLACE 2 SPRAYS INTO BOTH NOSTRILS DAILY AS NEEDED FOR ALLERGIES OR RHINITIS., Disp: 48 mL, Rfl: 1   hydrochlorothiazide  (HYDRODIURIL ) 12.5 MG tablet, TAKE 1 TABLET BY MOUTH EVERY DAY, Disp: 90 tablet, Rfl: 1   meclizine  (ANTIVERT ) 25 MG tablet, Take 12.5 mg by mouth 3 (three) times daily as needed for dizziness., Disp: , Rfl:    metoprolol succinate (TOPROL-XL) 50 MG 24 hr tablet, Take 50 mg by mouth daily., Disp: , Rfl:    Multiple Vitamins-Minerals (ECHINACEA ACZ PO), Take 1 capsule by mouth as needed (for immune support)., Disp: , Rfl:    nitroGLYCERIN (NITROSTAT) 0.4 MG SL tablet, Place 0.4 mg under the tongue every 5 (five) minutes as needed for chest pain., Disp: , Rfl:    Polyethyl Glycol-Propyl Glycol 0.4-0.3 % SOLN, Apply 1 drop to eye 2 (two) times a day. systane, Disp: , Rfl:    telmisartan (MICARDIS) 20 MG tablet, TAKE 1 TABLET BY MOUTH  EVERY DAY, Disp: 90 tablet, Rfl: 1   vitamin E 400 UNIT capsule, Take 400 Units by mouth daily., Disp: , Rfl:    albuterol  (VENTOLIN  HFA) 108 (90 Base) MCG/ACT inhaler, INHALE 2 PUFFS BY MOUTH EVERY 4 HOURS AS NEEDED FOR WHEEZE OR SHORTNESS OF BREATH, Disp: 18 each, Rfl: 0   aspirin  EC 81 MG tablet, Take 81 mg by mouth daily., Disp: , Rfl:    Allergies  Allergen Reactions   Codeine Nausea Only and Other (See Comments)    Reaction:Dizziness and hallucinations "makes me climb walls"   Doxycycline  Other (See Comments)     Review of Systems  Constitutional: Negative.   Eyes:  Negative for blurred vision.  Respiratory: Negative.  Cardiovascular: Negative.   Gastrointestinal: Negative.   Musculoskeletal:  Positive for neck pain.  Neurological: Negative.   Psychiatric/Behavioral: Negative.       Today's Vitals   09/23/23 1146  BP: 124/82  Pulse: 81  Temp: 98.1 F (36.7 C)  SpO2: 98%  Weight: 162 lb 12.8 oz (73.8 kg)  Height: 5\' 1"  (1.549 m)   Body mass index is 30.76 kg/m.  Wt Readings from Last 3 Encounters:  09/23/23 162 lb 12.8 oz (73.8 kg)  05/28/23 158 lb (71.7 kg)  05/25/23 161 lb 14.4 oz (73.4 kg)    BP Readings from Last 3 Encounters:  09/23/23 124/82  05/25/23 118/66  05/20/23 (!) 130/90     Objective:  Physical Exam Vitals and nursing note reviewed.  Constitutional:      Appearance: Normal appearance.  HENT:     Head: Normocephalic and atraumatic.     Right Ear: Ear canal and external ear normal. There is impacted cerumen.     Left Ear: Tympanic membrane, ear canal and external ear normal. There is no impacted cerumen.  Eyes:     Extraocular Movements: Extraocular movements intact.  Cardiovascular:     Rate and Rhythm: Normal rate and regular rhythm.     Heart sounds: Normal heart sounds.  Pulmonary:     Effort: Pulmonary effort is normal.     Breath sounds: Normal breath sounds.  Musculoskeletal:     Cervical back: Normal range of motion.      Comments: Ambulatory with cane  Skin:    General: Skin is warm.  Neurological:     General: No focal deficit present.     Mental Status: She is alert.  Psychiatric:        Mood and Affect: Mood normal.        Behavior: Behavior normal.         Assessment And Plan:  Hypertensive nephropathy Assessment & Plan: Chronic, uncontrolled.  Importance of medication and dietary compliance was discussed with the patient. She will c/w amlodipine 2.5mg  every other day, metoprolol XL 50mg  daily, telmisartan 20mg   and hydrochlorothiazide  12.5mg  daily. Will consider increasing telmisartan to 40mg  daily, in hopes that we can d/c amlodipine to lessen her pill burden.  Blood pressure controlled in office but elevated at home. Discussed importance of control to prevent complications. Emphasized verifying home monitor accuracy. - Verify home monitor accuracy with office readings. - Continue amlodipine 2.5 mg every other day, monitor swelling. - Encourage feet elevation for swelling. - Bring monitor to next visit for calibration.  Orders: -     CMP14+EGFR -     Lipid panel  Chronic renal disease, stage II Assessment & Plan: Chronic, I will check labs as listed below. Encouraged to stay well hydrated and keep BP controlled to decrease risk of CKD progression.     Orders: -     CMP14+EGFR  Right ear impacted cerumen Assessment & Plan: She declines right ear irrigation. She plans to use Debrox gtt. She was advised that this could contribute to her dizziness.    Mild intermittent asthma without complication Assessment & Plan: Chronic, encouraged to avoid known triggers. She reports experiencing wheezing; however, admits she has not been taking Breztri as prescribed. She is encouraged to take meds as prescribed. May have to consider trial of Breo if she is still unable to tolerate Breztri.  Continue albuterol  HFA, 1 to 2 inhalations every 4-6 hours if needed. Subjective and objective measures of  pulmonary function will  be followed and the treatment plan will be adjusted accordingly.   Other eczema Assessment & Plan: Rash on chest and neck, possibly from sweat and hair. No vesicles noted. Using triamcinolone  with some improvement. - Prescribe different cream if needed. - Advise avoiding scratching and keeping hair off area.  Orders: -     Clotrimazole -Betamethasone ; Apply 1 Application topically 2 (two) times daily. As needed  Dispense: 30 g; Refill: 0  Other abnormal glucose Assessment & Plan: Previous labs reviewed, her A1c has been elevated in the past. I will check an A1c today. Reminded to avoid refined sugars including sugary drinks/foods and processed meats including bacon, sausages and deli meats.    Orders: -     CMP14+EGFR -     Hemoglobin A1c  Class 1 obesity due to excess calories with serious comorbidity and body mass index (BMI) of 30.0 to 30.9 in adult Assessment & Plan: She is encouraged to aim for at least 150 minutes of exercise per week.      Return for 4 month abnormal glucose f/u.Aaron Aas  Patient was given opportunity to ask questions. Patient verbalized understanding of the plan and was able to repeat key elements of the plan. All questions were answered to their satisfaction.    I, Smiley Dung, MD, have reviewed all documentation for this visit. The documentation on 09/23/23 for the exam, diagnosis, procedures, and orders are all accurate and complete.   IF YOU HAVE BEEN REFERRED TO A SPECIALIST, IT MAY TAKE 1-2 WEEKS TO SCHEDULE/PROCESS THE REFERRAL. IF YOU HAVE NOT HEARD FROM US /SPECIALIST IN TWO WEEKS, PLEASE GIVE US  A CALL AT [phone].   THE PATIENT IS ENCOURAGED TO PRACTICE SOCIAL DISTANCING DUE TO THE COVID-19 PANDEMIC.

## 2023-09-23 NOTE — Patient Instructions (Signed)
 Hypertension, Adult Hypertension is another name for high blood pressure. High blood pressure forces your heart to work harder to pump blood. This can cause problems over time. There are two numbers in a blood pressure reading. There is a top number (systolic) over a bottom number (diastolic). It is best to have a blood pressure that is below 120/80. What are the causes? The cause of this condition is not known. Some other conditions can lead to high blood pressure. What increases the risk? Some lifestyle factors can make you more likely to develop high blood pressure: Smoking. Not getting enough exercise or physical activity. Being overweight. Having too much fat, sugar, calories, or salt (sodium) in your diet. Drinking too much alcohol. Other risk factors include: Having any of these conditions: Heart disease. Diabetes. High cholesterol. Kidney disease. Obstructive sleep apnea. Having a family history of high blood pressure and high cholesterol. Age. The risk increases with age. Stress. What are the signs or symptoms? High blood pressure may not cause symptoms. Very high blood pressure (hypertensive crisis) may cause: Headache. Fast or uneven heartbeats (palpitations). Shortness of breath. Nosebleed. Vomiting or feeling like you may vomit (nauseous). Changes in how you see. Very bad chest pain. Feeling dizzy. Seizures. How is this treated? This condition is treated by making healthy lifestyle changes, such as: Eating healthy foods. Exercising more. Drinking less alcohol. Your doctor may prescribe medicine if lifestyle changes do not help enough and if: Your top number is above 130. Your bottom number is above 80. Your personal target blood pressure may vary. Follow these instructions at home: Eating and drinking  If told, follow the DASH eating plan. To follow this plan: Fill one half of your plate at each meal with fruits and vegetables. Fill one fourth of your plate  at each meal with whole grains. Whole grains include whole-wheat pasta, brown rice, and whole-grain bread. Eat or drink low-fat dairy products, such as skim milk or low-fat yogurt. Fill one fourth of your plate at each meal with low-fat (lean) proteins. Low-fat proteins include fish, chicken without skin, eggs, beans, and tofu. Avoid fatty meat, cured and processed meat, or chicken with skin. Avoid pre-made or processed food. Limit the amount of salt in your diet to less than 1,500 mg each day. Do not drink alcohol if: Your doctor tells you not to drink. You are pregnant, may be pregnant, or are planning to become pregnant. If you drink alcohol: Limit how much you have to: 0-1 drink a day for women. 0-2 drinks a day for men. Know how much alcohol is in your drink. In the [place] one drink equals one 12 oz bottle of beer (355 mL), one 5 oz glass of wine (148 mL), or one 1 oz glass of hard liquor (44 mL). Lifestyle  Work with your doctor to stay at a healthy weight or to lose weight. Ask your doctor what the best weight is for you. Get at least 30 minutes of exercise that causes your heart to beat faster (aerobic exercise) most days of the week. This may include walking, swimming, or biking. Get at least 30 minutes of exercise that strengthens your muscles (resistance exercise) at least 3 days a week. This may include lifting weights or doing Pilates. Do not smoke or use any products that contain nicotine or tobacco. If you need help quitting, ask your doctor. Check your blood pressure at home as told by your doctor. Keep all follow-up visits. Medicines Take over-the-counter and prescription medicines  only as told by your doctor. Follow directions carefully. Do not skip doses of blood pressure medicine. The medicine does not work as well if you skip doses. Skipping doses also puts you at risk for problems. Ask your doctor about side effects or reactions to medicines that you should watch  for. Contact a doctor if: You think you are having a reaction to the medicine you are taking. You have headaches that keep coming back. You feel dizzy. You have swelling in your ankles. You have trouble with your vision. Get help right away if: You get a very bad headache. You start to feel mixed up (confused). You feel weak or numb. You feel faint. You have very bad pain in your: Chest. Belly (abdomen). You vomit more than once. You have trouble breathing. These symptoms may be an emergency. Get help right away. Call 911. Do not wait to see if the symptoms will go away. Do not drive yourself to the hospital. Summary Hypertension is another name for high blood pressure. High blood pressure forces your heart to work harder to pump blood. For most people, a normal blood pressure is less than 120/80. Making healthy choices can help lower blood pressure. If your blood pressure does not get lower with healthy choices, you may need to take medicine. This information is not intended to replace advice given to you by your health care provider. Make sure you discuss any questions you have with your health care provider. Document Revised: 03/13/2021 Document Reviewed: 03/13/2021 Elsevier Patient Education  2024 [employer].

## 2023-09-24 LAB — CMP14+EGFR
ALT: 19 IU/L (ref 0–32)
AST: 32 IU/L (ref 0–40)
Albumin: 4.5 g/dL (ref 3.6–4.6)
Alkaline Phosphatase: 77 IU/L (ref 44–121)
BUN/Creatinine Ratio: 20 (ref 12–28)
BUN: 15 mg/dL (ref 10–36)
Bilirubin Total: 0.6 mg/dL (ref 0.0–1.2)
CO2: 25 mmol/L (ref 20–29)
Calcium: 9.8 mg/dL (ref 8.7–10.3)
Chloride: 99 mmol/L (ref 96–106)
Creatinine, Ser: 0.75 mg/dL (ref 0.57–1.00)
Globulin, Total: 3.2 g/dL (ref 1.5–4.5)
Glucose: 93 mg/dL (ref 70–99)
Potassium: 4.1 mmol/L (ref 3.5–5.2)
Sodium: 138 mmol/L (ref 134–144)
Total Protein: 7.7 g/dL (ref 6.0–8.5)
eGFR: 75 mL/min/{1.73_m2} (ref >59)

## 2023-09-24 LAB — LIPID PANEL
Chol/HDL Ratio: 2.3 ratio (ref 0.0–4.4)
Cholesterol, Total: 137 mg/dL (ref 100–199)
HDL: 60 mg/dL (ref >39)
LDL Chol Calc (NIH): 64 mg/dL (ref 0–99)
Triglycerides: 64 mg/dL (ref 0–149)
VLDL Cholesterol Cal: 13 mg/dL (ref 5–40)

## 2023-09-24 LAB — HEMOGLOBIN A1C
Est. average glucose Bld gHb Est-mCnc: 120 mg/dL
Hgb A1c MFr Bld: 5.8 % — ABNORMAL HIGH (ref 4.8–5.6)

## 2023-09-25 ENCOUNTER — Encounter: Payer: Self-pay | Admitting: Internal Medicine

## 2023-09-28 ENCOUNTER — Other Ambulatory Visit: Payer: Self-pay | Admitting: Allergy & Immunology

## 2023-10-02 DIAGNOSIS — H6121 Impacted cerumen, right ear: Secondary | ICD-10-CM | POA: Insufficient documentation

## 2023-10-02 DIAGNOSIS — L309 Dermatitis, unspecified: Secondary | ICD-10-CM | POA: Insufficient documentation

## 2023-10-02 NOTE — Assessment & Plan Note (Signed)
 Rash on chest and neck, possibly from sweat and hair. No vesicles noted. Using triamcinolone  with some improvement. - Prescribe different cream if needed. - Advise avoiding scratching and keeping hair off area.

## 2023-10-02 NOTE — Assessment & Plan Note (Addendum)
 Chronic, encouraged to avoid known triggers. She reports experiencing wheezing; however, admits she has not been taking Breztri as prescribed. She is encouraged to take meds as prescribed. May have to consider trial of Breo if she is still unable to tolerate Breztri.  Continue albuterol  HFA, 1 to 2 inhalations every 4-6 hours if needed. Subjective and objective measures of pulmonary function will be followed and the treatment plan will be adjusted accordingly.

## 2023-10-02 NOTE — Assessment & Plan Note (Signed)
 Chronic, uncontrolled.  Importance of medication and dietary compliance was discussed with the patient. She will c/w amlodipine 2.5mg  every other day, metoprolol XL 50mg  daily, telmisartan 20mg   and hydrochlorothiazide  12.5mg  daily. Will consider increasing telmisartan to 40mg  daily, in hopes that we can d/c amlodipine to lessen her pill burden.  Blood pressure controlled in office but elevated at home. Discussed importance of control to prevent complications. Emphasized verifying home monitor accuracy. - Verify home monitor accuracy with office readings. - Continue amlodipine 2.5 mg every other day, monitor swelling. - Encourage feet elevation for swelling. - Bring monitor to next visit for calibration.

## 2023-10-02 NOTE — Assessment & Plan Note (Signed)
 Chronic, I will check labs as listed below. Encouraged to stay well hydrated and keep BP controlled to decrease risk of CKD progression.

## 2023-10-02 NOTE — Assessment & Plan Note (Signed)
 Previous labs reviewed, her A1c has been elevated in the past. I will check an A1c today. Reminded to avoid refined sugars including sugary drinks/foods and processed meats including bacon, sausages and deli meats.

## 2023-10-02 NOTE — Assessment & Plan Note (Addendum)
 She declines right ear irrigation. She plans to use Debrox gtt. She was advised that this could contribute to her dizziness.

## 2023-10-02 NOTE — Assessment & Plan Note (Signed)
 She is encouraged to aim for at least 150 minutes of exercise per week.

## 2023-10-19 DIAGNOSIS — R059 Cough, unspecified: Secondary | ICD-10-CM | POA: Diagnosis not present

## 2023-10-19 DIAGNOSIS — G44209 Tension-type headache, unspecified, not intractable: Secondary | ICD-10-CM | POA: Diagnosis not present

## 2023-10-19 DIAGNOSIS — R6883 Chills (without fever): Secondary | ICD-10-CM | POA: Diagnosis not present

## 2023-10-19 DIAGNOSIS — J069 Acute upper respiratory infection, unspecified: Secondary | ICD-10-CM | POA: Diagnosis not present

## 2023-10-19 DIAGNOSIS — R062 Wheezing: Secondary | ICD-10-CM | POA: Diagnosis not present

## 2023-10-19 DIAGNOSIS — R051 Acute cough: Secondary | ICD-10-CM | POA: Diagnosis not present

## 2023-11-16 ENCOUNTER — Other Ambulatory Visit: Payer: Self-pay | Admitting: Allergy & Immunology

## 2023-11-23 ENCOUNTER — Ambulatory Visit: Payer: Medicare Other | Admitting: Allergy & Immunology

## 2023-11-26 ENCOUNTER — Encounter (INDEPENDENT_AMBULATORY_CARE_PROVIDER_SITE_OTHER): Payer: Self-pay

## 2023-11-26 ENCOUNTER — Ambulatory Visit (INDEPENDENT_AMBULATORY_CARE_PROVIDER_SITE_OTHER): Payer: Medicare Other | Admitting: Otolaryngology

## 2024-01-17 ENCOUNTER — Other Ambulatory Visit: Payer: Self-pay | Admitting: Internal Medicine

## 2024-01-17 ENCOUNTER — Other Ambulatory Visit: Payer: Self-pay | Admitting: Allergy & Immunology

## 2024-01-20 ENCOUNTER — Ambulatory Visit (INDEPENDENT_AMBULATORY_CARE_PROVIDER_SITE_OTHER): Admitting: Otolaryngology

## 2024-01-20 ENCOUNTER — Ambulatory Visit: Admitting: Allergy & Immunology

## 2024-01-27 ENCOUNTER — Ambulatory Visit: Admitting: Internal Medicine

## 2024-01-31 ENCOUNTER — Encounter: Payer: Self-pay | Admitting: Internal Medicine

## 2024-01-31 ENCOUNTER — Ambulatory Visit (INDEPENDENT_AMBULATORY_CARE_PROVIDER_SITE_OTHER): Admitting: Internal Medicine

## 2024-01-31 VITALS — BP 120/64 | HR 63 | Temp 97.7°F | Ht 61.0 in | Wt 155.0 lb

## 2024-01-31 DIAGNOSIS — M542 Cervicalgia: Secondary | ICD-10-CM

## 2024-01-31 DIAGNOSIS — N182 Chronic kidney disease, stage 2 (mild): Secondary | ICD-10-CM | POA: Diagnosis not present

## 2024-01-31 DIAGNOSIS — Z8709 Personal history of other diseases of the respiratory system: Secondary | ICD-10-CM | POA: Insufficient documentation

## 2024-01-31 DIAGNOSIS — E2839 Other primary ovarian failure: Secondary | ICD-10-CM

## 2024-01-31 DIAGNOSIS — H6121 Impacted cerumen, right ear: Secondary | ICD-10-CM

## 2024-01-31 DIAGNOSIS — R7309 Other abnormal glucose: Secondary | ICD-10-CM | POA: Diagnosis not present

## 2024-01-31 DIAGNOSIS — I129 Hypertensive chronic kidney disease with stage 1 through stage 4 chronic kidney disease, or unspecified chronic kidney disease: Secondary | ICD-10-CM

## 2024-01-31 DIAGNOSIS — Z2821 Immunization not carried out because of patient refusal: Secondary | ICD-10-CM | POA: Diagnosis not present

## 2024-01-31 DIAGNOSIS — H9201 Otalgia, right ear: Secondary | ICD-10-CM

## 2024-01-31 NOTE — Patient Instructions (Signed)
 Mediterranean Diet A Mediterranean diet is based on the traditions of countries on the [employer]. It focuses on eating more: Fruits and vegetables. Whole grains, beans, nuts, and seeds. Heart-healthy fats. These are fats that are good for your heart. It involves eating less: Dairy. Meat and eggs. Processed foods with added sugar, salt, and fat. This type of diet can help prevent certain conditions. It can also improve outcomes if you have a long-term (chronic) disease, such as kidney or heart disease. What are tips for following this plan? Reading food labels Check packaged foods for: The serving size. For foods such as rice and pasta, the serving size is the amount of cooked product, not dry. The total fat. Avoid foods with saturated fat or trans fat. Added sugars, such as corn syrup. Shopping  Try to have a balanced diet. Buy a variety of foods, such as: Fresh fruits and vegetables. You may be able to get these from local farmers markets. You can also buy them frozen. Grains, beans, nuts, and seeds. Some of these can be bought in bulk. Fresh seafood. Poultry and eggs. Low-fat dairy products. Buy whole ingredients instead of foods that have already been packaged. If you can't get fresh seafood, buy precooked frozen shrimp or canned fish, such as tuna, salmon, or sardines. Stock your pantry so you always have certain foods on hand, such as olive oil, canned tuna, canned tomatoes, rice, pasta, and beans. Cooking Cook foods with extra-virgin olive oil instead of using butter or other vegetable oils. Have meat as a side dish. Have vegetables or grains as your main dish. This means having meat in small portions or adding small amounts of meat to foods like pasta or stew. Use beans or vegetables instead of meat in common dishes like chili or lasagna. Try out different cooking methods. Try roasting, broiling, steaming, and sauting vegetables. Add frozen vegetables to soups, stews,  pasta, or rice. Add nuts or seeds for added healthy fats and plant protein at each meal. You can add these to yogurt, salads, or vegetable dishes. Marinate fish or vegetables using olive oil, lemon juice, garlic, and fresh herbs. Meal planning Plan to eat a vegetarian meal one day each week. Try to work up to two vegetarian meals, if possible. Eat seafood two or more times a week. Have healthy snacks on hand. These may include: Vegetable sticks with hummus. Greek yogurt. Fruit and nut trail mix. Eat balanced meals. These should include: Fruit: 2-3 servings a day. Vegetables: 4-5 servings a day. Low-fat dairy: 2 servings a day. Fish, poultry, or lean meat: 1 serving a day. Beans and legumes: 2 or more servings a week. Nuts and seeds: 1-2 servings a day. Whole grains: 6-8 servings a day. Extra-virgin olive oil: 3-4 servings a day. Limit red meat and sweets to just a few servings a month. Lifestyle  Try to cook and eat meals with your family. Drink enough fluid to keep your pee (urine) pale yellow. Be active every day. This includes: Aerobic exercise, which is exercise that causes your heart to beat faster. Examples include running and swimming. Leisure activities like gardening, walking, or housework. Get 7-8 hours of sleep each night. Drink red wine if your provider says you can. A glass of wine is 5 oz (150 mL). You may be allowed to have: Up to 1 glass a day if you're female and not pregnant. Up to 2 glasses a day if you're female. What foods should I eat? Fruits Apples. Apricots. Avocado.  Berries. Bananas. Cherries. Dates. Figs. Grapes. Lemons. Melon. Oranges. Peaches. Plums. Pomegranate. Vegetables Artichokes. Beets. Broccoli. Cabbage. Carrots. Eggplant. Green beans. Chard. Kale. Spinach. Onions. Leeks. Peas. Squash. Tomatoes. Peppers. Radishes. Grains Whole-grain pasta. Brown rice. Bulgur wheat. Polenta. Couscous. Whole-wheat bread. [place] Madeira. Meats and other  proteins Beans. Almonds. Sunflower seeds. Pine nuts. Peanuts. Cod. Salmon. Scallops. Shrimp. Tuna. Tilapia. Clams. Oysters. Eggs. Chicken or [place] without skin. Dairy Low-fat milk. Cheese. Greek yogurt. Fats and oils Extra-virgin olive oil. Avocado oil. Grapeseed oil. Beverages Water. Red wine. Herbal tea. Sweets and desserts Greek yogurt with honey. Baked apples. Poached pears. Trail mix. Seasonings and condiments Basil. Cilantro. Coriander. Cumin. Mint. Parsley. Sage. Rosemary. Tarragon. Garlic. Oregano. Thyme. Pepper. Balsamic vinegar. Tahini. Hummus. Tomato sauce. Olives. Mushrooms. The items listed above may not be all the foods and drinks you can have. Talk to a dietitian to learn more. What foods should I limit? This is a list of foods that should be eaten rarely. Fruits Fruit canned in syrup. Vegetables Deep-fried potatoes, like [place] fries. Grains Packaged pasta or rice dishes. Cereal with added sugar. Snacks with added sugar. Meats and other proteins Beef. Pork. Lamb. Chicken or [place] with skin. Hot dogs. [place]. Dairy Ice cream. Sour cream. Whole milk. Fats and oils Butter. Canola oil. Vegetable oil. Beef fat (tallow). Lard. Beverages Juice. Sugar-sweetened soft drinks. Beer. Liquor and spirits. Sweets and desserts Cookies. Cakes. Pies. Candy. Seasonings and condiments Mayonnaise. Pre-made sauces and marinades. The items listed above may not be all the foods and drinks you should limit. Talk to a dietitian to learn more. Where to find more information American Heart Association (AHA): heart.org This information is not intended to replace advice given to you by your health care provider. Make sure you discuss any questions you have with your health care provider. Document Revised: 09/06/2022 Document Reviewed: 09/06/2022 Elsevier Patient Education  2024 [employer].

## 2024-01-31 NOTE — Assessment & Plan Note (Signed)
 Chronic, intermittent. She has been evaluated by Orthopedics in the past. She declines referral to physical therapy. She will notify me when she is ready to go.

## 2024-01-31 NOTE — Assessment & Plan Note (Signed)
 Chronic, well controlled.  Importance of medication and dietary compliance was discussed with the patient. She will c/w amlodipine 2.5mg  every other day, metoprolol XL 50mg  daily, telmisartan 20mg   and hydrochlorothiazide  12.5mg  daily. Discussed importance of control to prevent complications. Emphasized verifying home monitor accuracy. - Verify home monitor accuracy with office readings. - Bring monitor to next visit for calibration.

## 2024-01-31 NOTE — Assessment & Plan Note (Signed)
 ER records reviewed from [facility]. She was seen May 2025 for cough. Diagnosed with viral URI. Prescribed prednisone  and guaifenesin syrup.

## 2024-01-31 NOTE — Assessment & Plan Note (Signed)
 Chronic, she is encouraged to stay well hydrated, avoid NSAIDs and keep BP controlled to prevent progression of CKD.

## 2024-01-31 NOTE — Assessment & Plan Note (Signed)
 She declined ear irrigation today. Prefers to have this issue addressed by her allergist. She has an appt scheduled for tomorrow.

## 2024-01-31 NOTE — Progress Notes (Signed)
 Paula Pollard, CMA,acting as a Neurosurgeon for Paula LOISE Slocumb, MD.,have documented all relevant documentation on the behalf of Paula LOISE Slocumb, MD,as directed by  Paula LOISE Slocumb, MD while in the presence of Paula LOISE Slocumb, MD.  Subjective:  Patient ID: Paula Pollard , female    DOB: 07-17-1929 , 89 y.o.   MRN: [id number]  Chief Complaint  Patient presents with   Hypertension    Patient presents today for bpc. Patient reports compliance with her meds. Patient denies having chest pain,sob or headaches at this time. Patient doesn't have any questions or concerns at this time.    HPI Discussed the use of AI scribe software for clinical note transcription with the patient, who gave verbal consent to proceed.  History of Present Illness [name] is a 89 year old female with hypertension who presents for a blood pressure check.  She consistently uses her medications, including amlodipine 2.5 mg, atorvastatin  40 mg, hydrochlorothiazide , metoprolol, and telmisartan. Her home blood pressure readings have been variable, with a recent systolic reading of 153 mmHg. She suspects her home blood pressure machine may be inaccurate.  Recently, she traveled to [place]  to care for her brother, who passed away, and her niece with severe diabetes. During this time, she developed bronchitis and was treated in the hospital with an inhaler, steroids, Tylenol , cough syrup, and Robitussin. No current chest pain or shortness of breath.  She has been experiencing right ear pain for about a month, which she attributes to water entering her ear while washing her hair. She was previously prescribed ear drops but did not use them as she lost the bottle. The pain subsided temporarily but has since returned, causing dizziness when lying on that side.  She has a history of arthritis of the spine, affecting her neck mobility. She can turn her neck but is not satisfied with its condition. She has not pursued  physical therapy due to transportation difficulties. She prefers Tylenol  over Motrin for pain management.   Hypertension This is a chronic problem. The current episode started more than 1 year ago. The problem has been gradually improving since onset. The problem is controlled. Associated symptoms include neck pain. Pertinent negatives include no blurred vision. Risk factors for coronary artery disease include sedentary lifestyle, obesity, stress and post-menopausal state. There are no compliance problems.  Hypertensive end-organ damage includes kidney disease.     Past Medical History:  Diagnosis Date   Anemia    in past as a young girl   Anxiety    Arthritis    Asthma    Bronchitis    hx of   Cataract    bilaterally, no surgery at this time   Diabetes mellitus without complication (HCC)    pre diabetic   Dysrhythmia    when gets excited   GERD (gastroesophageal reflux disease)    Headache(784.0)    hx of migraines   History of hiatal hernia    Hyperlipidemia    Hypertension    sees Dr. Levern   Neuromuscular disorder [facility])    hx of carpal tunnel never had surgery forright hand     Family History  Problem Relation Age of Onset   Heart attack Mother    Heart attack Father    Stroke Father    Stroke Daughter    Colon cancer Sister      Current Outpatient Medications:    acetaminophen  (TYLENOL ) 500 MG tablet, Take 500 mg by mouth every  6 (six) hours as needed for moderate pain or headache., Disp: , Rfl:    albuterol  (VENTOLIN  HFA) 108 (90 Base) MCG/ACT inhaler, INHALE 2 PUFFS BY MOUTH EVERY 4 HOURS AS NEEDED FOR WHEEZE OR SHORTNESS OF BREATH, Disp: 18 each, Rfl: 0   amLODipine (NORVASC) 2.5 MG tablet, Take 2.5 mg by mouth daily., Disp: , Rfl:    Ascorbic Acid (VITAMIN C PO), Take 1 tablet by mouth daily., Disp: , Rfl:    aspirin  EC 81 MG tablet, Take 81 mg by mouth daily., Disp: , Rfl:    atorvastatin  (LIPITOR) 40 MG tablet, Take 40 mg by mouth daily., Disp: ,  Rfl:    Azelastine  HCl 137 MCG/SPRAY SOLN, PLACE 1 SPRAY INTO BOTH NOSTRILS DAILY. USE IN EACH NOSTRIL AS DIRECTED, Disp: 30 mL, Rfl: 5   cholecalciferol (VITAMIN D ) 400 units TABS tablet, Take 600 Units by mouth daily., Disp: , Rfl:    ciprofloxacin -dexamethasone  (CIPRODEX ) OTIC suspension, Place 4 drops into both ears 2 (two) times daily., Disp: 7.5 mL, Rfl: 1   clotrimazole -betamethasone  (LOTRISONE ) cream, Apply 1 Application topically 2 (two) times daily. As needed, Disp: 30 g, Rfl: 0   Cyanocobalamin  (B-12 PO), Take 500 mcg by mouth daily., Disp: , Rfl:    esomeprazole (NEXIUM) 20 MG capsule, Take 20 mg by mouth as needed (for heartburn)., Disp: , Rfl:    fluticasone  (FLONASE ) 50 MCG/ACT nasal spray, PLACE 2 SPRAYS INTO BOTH NOSTRILS DAILY AS NEEDED FOR ALLERGIES OR RHINITIS., Disp: 48 mL, Rfl: 1   hydrochlorothiazide  (HYDRODIURIL ) 12.5 MG tablet, TAKE 1 TABLET BY MOUTH EVERY DAY, Disp: 90 tablet, Rfl: 1   meclizine  (ANTIVERT ) 25 MG tablet, Take 12.5 mg by mouth 3 (three) times daily as needed for dizziness., Disp: , Rfl:    metoprolol succinate (TOPROL-XL) 50 MG 24 hr tablet, Take 50 mg by mouth daily., Disp: , Rfl:    Multiple Vitamins-Minerals (ECHINACEA ACZ PO), Take 1 capsule by mouth as needed (for immune support)., Disp: , Rfl:    nitroGLYCERIN (NITROSTAT) 0.4 MG SL tablet, Place 0.4 mg under the tongue every 5 (five) minutes as needed for chest pain., Disp: , Rfl:    Polyethyl Glycol-Propyl Glycol 0.4-0.3 % SOLN, Apply 1 drop to eye 2 (two) times a day. systane, Disp: , Rfl:    telmisartan (MICARDIS) 20 MG tablet, TAKE 1 TABLET BY MOUTH EVERY DAY, Disp: 90 tablet, Rfl: 1   vitamin E 400 UNIT capsule, Take 400 Units by mouth daily., Disp: , Rfl:    Allergies  Allergen Reactions   Codeine Nausea Only and Other (See Comments)    Reaction:Dizziness and hallucinations makes me climb walls   Doxycycline  Other (See Comments)     Review of Systems  Constitutional: Negative.   Eyes:   Negative for blurred vision.  Respiratory: Negative.    Cardiovascular: Negative.   Gastrointestinal: Negative.   Musculoskeletal:  Positive for neck pain.  Neurological: Negative.   Psychiatric/Behavioral: Negative.       Today's Vitals   01/31/24 0821  BP: 120/64  Pulse: 63  Temp: 97.7 F (36.5 C)  TempSrc: Oral  Weight: 155 lb (70.3 kg)  Height: 5' 1 (1.549 m)  PainSc: 0-No pain   Body mass index is 29.29 kg/m.  Wt Readings from Last 3 Encounters:  01/31/24 155 lb (70.3 kg)  09/23/23 162 lb 12.8 oz (73.8 kg)  05/28/23 158 lb (71.7 kg)    The ASCVD Risk score (Arnett DK, et al., 2019) failed to calculate  for the following reasons:   The 2019 ASCVD risk score is only valid for ages 51 to 55  Objective:  Physical Exam Vitals and nursing note reviewed.  Constitutional:      Appearance: Normal appearance.  HENT:     Head: Normocephalic and atraumatic.     Right Ear: Ear canal and external ear normal. There is impacted cerumen.     Left Ear: Tympanic membrane, ear canal and external ear normal.     Ears:     Comments: Unable to visualize right TM Eyes:     Extraocular Movements: Extraocular movements intact.  Cardiovascular:     Rate and Rhythm: Normal rate and regular rhythm.     Heart sounds: Normal heart sounds.  Pulmonary:     Effort: Pulmonary effort is normal.     Breath sounds: Normal breath sounds.  Skin:    General: Skin is warm.  Neurological:     General: No focal deficit present.     Mental Status: She is alert.  Psychiatric:        Mood and Affect: Mood normal.        Behavior: Behavior normal.         Assessment And Plan:  Hypertensive nephropathy Assessment & Plan: Chronic, well controlled.  Importance of medication and dietary compliance was discussed with the patient. She will c/w amlodipine 2.5mg  every other day, metoprolol XL 50mg  daily, telmisartan 20mg   and hydrochlorothiazide  12.5mg  daily. Discussed importance of control to prevent  complications. Emphasized verifying home monitor accuracy. - Verify home monitor accuracy with office readings. - Bring monitor to next visit for calibration.  Orders: -     BMP8+EGFR  Chronic renal disease, stage II Assessment & Plan: Chronic, she is encouraged to stay well hydrated, avoid NSAIDs and keep BP controlled to prevent progression of CKD.     Right ear pain Assessment & Plan: Chronic pain possibly due to water exposure and wax buildup, dizziness noted. - Follow up with Dr. Iva for examination and possible wax removal.   Cervicalgia Assessment & Plan: Chronic, intermittent. She has been evaluated by Orthopedics in the past. She declines referral to physical therapy. She will notify me when she is ready to go.    Excessive cerumen in ear canal, right Assessment & Plan: She declined ear irrigation today. Prefers to have this issue addressed by her allergist. She has an appt scheduled for tomorrow.    Estrogen deficiency -     DG Bone Density; Future  Other abnormal glucose Assessment & Plan: Previous labs reviewed, her A1c has been elevated in the past. I will check an A1c today. Reminded to avoid refined sugars including sugary drinks/foods and processed meats including bacon, sausages and deli meats.    Orders: -     Hemoglobin A1c -     BMP8+EGFR  History of URI (upper respiratory infection) Assessment & Plan: ER records reviewed from [facility]. She was seen May 2025 for cough. Diagnosed with viral URI. Prescribed prednisone  and guaifenesin syrup.   Herpes zoster vaccination declined   Return in about 6 months (around 08/02/2024), or bp check.  Patient was given opportunity to ask questions. Patient verbalized understanding of the plan and was able to repeat key elements of the plan. All questions were answered to their satisfaction.    I, Paula LOISE Slocumb, MD, have reviewed all documentation for this visit. The documentation on  01/31/24 for the exam, diagnosis, procedures, and orders are all  accurate and complete.   IF YOU HAVE BEEN REFERRED TO A SPECIALIST, IT MAY TAKE 1-2 WEEKS TO SCHEDULE/PROCESS THE REFERRAL. IF YOU HAVE NOT HEARD FROM US /SPECIALIST IN TWO WEEKS, PLEASE GIVE US  A CALL AT [phone].

## 2024-01-31 NOTE — Assessment & Plan Note (Signed)
 Previous labs reviewed, her A1c has been elevated in the past. I will check an A1c today. Reminded to avoid refined sugars including sugary drinks/foods and processed meats including bacon, sausages and deli meats.

## 2024-01-31 NOTE — Assessment & Plan Note (Signed)
 Chronic pain possibly due to water exposure and wax buildup, dizziness noted. - Follow up with Dr. Iva for examination and possible wax removal.

## 2024-02-01 ENCOUNTER — Encounter: Payer: Self-pay | Admitting: Allergy & Immunology

## 2024-02-01 ENCOUNTER — Ambulatory Visit (INDEPENDENT_AMBULATORY_CARE_PROVIDER_SITE_OTHER): Admitting: Allergy & Immunology

## 2024-02-01 ENCOUNTER — Ambulatory Visit: Payer: Self-pay | Admitting: Internal Medicine

## 2024-02-01 VITALS — BP 132/78 | HR 69 | Ht 61.0 in | Wt 155.0 lb

## 2024-02-01 DIAGNOSIS — J3089 Other allergic rhinitis: Secondary | ICD-10-CM | POA: Diagnosis not present

## 2024-02-01 DIAGNOSIS — R42 Dizziness and giddiness: Secondary | ICD-10-CM | POA: Diagnosis not present

## 2024-02-01 DIAGNOSIS — J4540 Moderate persistent asthma, uncomplicated: Secondary | ICD-10-CM | POA: Diagnosis not present

## 2024-02-01 LAB — HEMOGLOBIN A1C
Est. average glucose Bld gHb Est-mCnc: 120 mg/dL
Hgb A1c MFr Bld: 5.8 % — ABNORMAL HIGH (ref 4.8–5.6)

## 2024-02-01 LAB — BMP8+EGFR
BUN/Creatinine Ratio: 18 (ref 12–28)
BUN: 14 mg/dL (ref 10–36)
CO2: 24 mmol/L (ref 20–29)
Calcium: 9.1 mg/dL (ref 8.7–10.3)
Chloride: 100 mmol/L (ref 96–106)
Creatinine, Ser: 0.79 mg/dL (ref 0.57–1.00)
Glucose: 84 mg/dL (ref 70–99)
Potassium: 3.7 mmol/L (ref 3.5–5.2)
Sodium: 141 mmol/L (ref 134–144)
eGFR: 71 mL/min/1.73 (ref >59)

## 2024-02-01 NOTE — Patient Instructions (Addendum)
 1. Mild intermittent asthma without complication - Lung testing looked weird today but we will check it at the next visit. - Daily controller medication(s): Breztri two puffs one to two times daily with spacer  - Prior to physical activity: Ventolin  2 puffs 10-15 minutes before physical activity. - Rescue medications: Ventolin  4 puffs every 4-6 hours as needed - Asthma control goals:  * Full participation in all desired activities (may need albuterol  before activity) * Albuterol  use two time or less a week on average (not counting use with activity) * Cough interfering with sleep two time or less a month * Oral steroids no more than once a year * No hospitalizations  2. Allergic rhinitis - Continue with Dymista  1-2 sprays per nostril twice daily.  - Continue with Pataday  one drop per eye twice daily.   3. Vertigo - Continue to follow with Dr. Karis as needed.   4. Return in about 3 months (around 05/03/2024).   Please inform us  of any Emergency Department visits, hospitalizations, or changes in symptoms. Call us  before going to the ED for breathing or allergy symptoms since we might be able to fit you in for a sick visit. Feel free to contact us  anytime with any questions, problems, or concerns.  It was a pleasure to see you again today!  Websites that have reliable patient information: 1. American Academy of Asthma, Allergy, and Immunology: www.aaaai.org 2. Food Allergy Research and Education (FARE): foodallergy.org 3. Mothers of Asthmatics: http://www.asthmacommunitynetwork.org 4. American College of Allergy, Asthma, and Immunology: www.acaai.org      "Like" us  on Facebook and Instagram for our latest updates!      A healthy democracy works best when [employer] participate! Make sure you are registered to vote! If you have moved or changed any of your contact information, you will need to get this updated before voting! Scan the QR codes below to learn more!

## 2024-02-01 NOTE — Progress Notes (Signed)
 FOLLOW UP  Date of Service/Encounter:  02/01/24   Assessment:   Mild intermittent asthma without complication - doing very well on the Breztri   Allergic rhinitis   Right cerumen impaction   Vertigo - sees Dr. Karis  Plan/Recommendations:   1. Mild intermittent asthma without complication - Lung testing looked weird today but we will check it at the next visit. - Daily controller medication(s): Breztri two puffs one to two times daily with spacer  - Prior to physical activity: Ventolin  2 puffs 10-15 minutes before physical activity. - Rescue medications: Ventolin  4 puffs every 4-6 hours as needed - Asthma control goals:  * Full participation in all desired activities (may need albuterol  before activity) * Albuterol  use two time or less a week on average (not counting use with activity) * Cough interfering with sleep two time or less a month * Oral steroids no more than once a year * No hospitalizations  2. Allergic rhinitis - Continue with Dymista  1-2 sprays per nostril twice daily.  - Continue with Pataday  one drop per eye twice daily.   3. Vertigo - Continue to follow with Dr. Karis as needed.   4. Return in about 3 months (around 05/03/2024).   Subjective:   Paula Pollard is a 89 y.o. female presenting today for follow up of  Chief Complaint  Patient presents with   Other allergic rhinitis   Asthma   Ear Fullness    Right ear, x1 month, concerned about water/fluid    Paula Pollard has a history of the following: Patient Active Problem List   Diagnosis Date Noted   History of URI (upper respiratory infection) 01/31/2024   Excessive cerumen in ear canal, right 10/02/2023   Eczema 10/02/2023   Class 1 obesity due to excess calories with serious comorbidity and body mass index (BMI) of 30.0 to 30.9 in adult 09/23/2023   Right ear pain 05/31/2023   Loose stools 05/23/2023   [place]one 05/23/2023   Cervicalgia 11/16/2022   Hypertensive nephropathy  04/23/2021   Chronic renal disease, stage II 04/23/2021   Hair loss 04/23/2021   Other abnormal glucose 04/23/2021   Mild intermittent asthma 06/26/2019   Dizziness 10/28/2018   Hypokalemia 10/28/2018   Acute sinusitis 10/05/2017   Other allergic rhinitis 10/05/2017   Asthma with acute exacerbation 03/06/2015   Unexplained night sweats 03/06/2015   Cough 02/16/2015   Essential hypertension, benign 08/25/2012   Other and unspecified hyperlipidemia 08/25/2012   Benign paroxysmal positional vertigo 08/25/2012   Constipation 08/25/2012   Generalized anxiety disorder 08/25/2012   GERD (gastroesophageal reflux disease) 08/25/2012   Osteoarthritis of right hip 08/10/2012    History obtained from: chart review and patient.  Discussed the use of AI scribe software for clinical note transcription with the patient and/or guardian, who gave verbal consent to proceed.  Paula Pollard is a 89 y.o. female presenting for a follow up visit.  She was last seen in December 2024.  At that time, like testing looked amazing.  We continue with Breztri 2 puffs twice daily as well as Ventolin .  For her allergic rhinitis, she is doing well on Dymista  and Pataday .  She continues to follow with Dr. Karis.  Since the last visit, she has done well.   Asthma/Respiratory Symptom History: Her breathing is stable, and she uses her Breztri inhaler twice daily, in the morning and at night, without needing additional doses during the day. She also uses a nasal spray more frequently at night.  No recent asthma attacks have required steroids or hospital visits. Her Breztri inhaler now costs $34 to $37, which is less than before. She does not require albuterol  refills as she does not use it, but she does need her nasal spray refilled.  She recently experienced an illness while in [place] , necessitating an ER visit where she was diagnosed with hepatitis and received a chest x-ray and antibiotics. She was prescribed the same  medication she was previously taking.  She is overall doing well. She is happy with how she is feeling. She has not needed antibiotics for her symptoms.   She was in [place]  to care for her brother, who passed away at the age of 54. She stayed to arrange the funeral and returned about a week ago. She is the last surviving sibling of ten, with seven siblings having died in infancy.  Otherwise, there have been no changes to her past medical history, surgical history, family history, or social history.    Review of systems otherwise negative other than that mentioned in the HPI.    Objective:   Blood pressure 132/78, pulse 69, height 5' 1 (1.549 m), weight 155 lb (70.3 kg), SpO2 96%. Body mass index is 29.29 kg/m.    Physical Exam Vitals reviewed.  Constitutional:      Appearance: She is well-developed.     Comments: Delightful female.  Very interactive. Very sweet!  Talkative.  HENT:     Head: Normocephalic and atraumatic.     Right Ear: Tympanic membrane, ear canal and external ear normal.     Left Ear: Tympanic membrane, ear canal and external ear normal.     Nose: No nasal deformity, septal deviation, mucosal edema or rhinorrhea.     Right Turbinates: Enlarged, swollen and pale.     Left Turbinates: Enlarged, swollen and pale.     Right Sinus: No maxillary sinus tenderness or frontal sinus tenderness.     Left Sinus: No maxillary sinus tenderness or frontal sinus tenderness.     Comments: No polyps.    Mouth/Throat:     Lips: Pink.     Mouth: Mucous membranes are moist. Mucous membranes are not pale and not dry.     Pharynx: Uvula midline.     Comments: Cobblestoning in the posterior oropharynx.  Eyes:     General: Lids are normal. Allergic shiner present.        Right eye: No discharge.        Left eye: No discharge.     Conjunctiva/sclera: Conjunctivae normal.     Right eye: Right conjunctiva is not injected. No chemosis.    Left eye: Left conjunctiva is not  injected. No chemosis.    Pupils: Pupils are equal, round, and reactive to light.  Cardiovascular:     Rate and Rhythm: Normal rate and regular rhythm.     Heart sounds: Normal heart sounds.  Pulmonary:     Effort: Pulmonary effort is normal. No tachypnea, accessory muscle usage or respiratory distress.     Breath sounds: Normal breath sounds. No wheezing, rhonchi or rales.     Comments: Moving air well in all lung fields.  No wheezes noted. No increased work of breathing noted.  Chest:     Chest wall: No tenderness.  Lymphadenopathy:     Cervical: No cervical adenopathy.  Skin:    Coloration: Skin is not pale.     Findings: No abrasion, erythema, petechiae or rash. Rash is not papular, urticarial or vesicular.  Neurological:     Mental Status: She is alert.  Psychiatric:        Behavior: Behavior is cooperative.      Diagnostic studies:    Spirometry: results abnormal (FEV1: 0.12/9%, FVC: 0.13/8%, FEV1/FVC: 62%).   Overall, this was a very poor effort versus equipment malfunction.  I checked all of the demographics and they were correct.  She is talking in full sentences.  She has had excellent spirometries in the past.  We will just check this at the next visit.    Allergy Studies: none        Marty Shaggy, MD  [facility] 

## 2024-02-02 ENCOUNTER — Ambulatory Visit (INDEPENDENT_AMBULATORY_CARE_PROVIDER_SITE_OTHER): Admitting: Otolaryngology

## 2024-02-02 ENCOUNTER — Encounter (INDEPENDENT_AMBULATORY_CARE_PROVIDER_SITE_OTHER): Payer: Self-pay | Admitting: Otolaryngology

## 2024-02-02 VITALS — BP 154/74 | HR 66

## 2024-02-02 DIAGNOSIS — H6121 Impacted cerumen, right ear: Secondary | ICD-10-CM | POA: Diagnosis not present

## 2024-02-02 DIAGNOSIS — R42 Dizziness and giddiness: Secondary | ICD-10-CM | POA: Diagnosis not present

## 2024-02-03 NOTE — Progress Notes (Signed)
 Patient ID: Paula Pollard, female   DOB: [birth year], 89 y.o.   MRN: [id number]  Follow-up: Recurrent dizziness, right ear pain  HPI: The patient is a 89 year old female who returns today for her follow-up evaluation.  The patient has a history of recurrent dizziness, secondary to benign paroxysmal positional vertigo.  She was treated with the right Epley maneuver.  She also has a history of recurrent otitis externa.  She was treated with Ciprodex  eardrops.  The patient returns today complaining of intermittent right ear pain.  She has also noted occasional dizziness, especially when she turns her head to the right quickly.  She denies any otorrhea or recent change in her hearing.  Exam: General: Communicates without difficulty, well nourished, no acute distress. Head: Normocephalic, no evidence injury, no tenderness, facial buttresses intact without stepoff. Face/sinus: No tenderness to palpation and percussion. Facial movement is normal and symmetric. Eyes: PERRL, EOMI. No scleral icterus, conjunctivae clear. Neuro: CN II exam reveals vision grossly intact.  No nystagmus at any point of gaze. Ears: Auricles well formed without lesions.  Right ear cerumen impaction.  The left ear canal and tympanic membrane are normal.  Nose: External evaluation reveals normal support and skin without lesions.  Dorsum is intact.  Anterior rhinoscopy reveals congested mucosa over anterior aspect of inferior turbinates and intact septum.  No purulence noted. Oral:  Oral cavity and oropharynx are intact, symmetric, without erythema or edema.  Mucosa is moist without lesions. Neck: Full range of motion without pain.  There is no significant lymphadenopathy.  No masses palpable.  Thyroid  bed within normal limits to palpation.  Parotid glands and submandibular glands equal bilaterally without mass.  Trachea is midline. Neuro:  CN 2-12 grossly intact. Vestibular: No nystagmus at any point of gaze. Dix Hallpike negative.  Vestibular: There is no nystagmus with pneumatic pressure on either tympanic membrane or Valsalva. The cerebellar examination is unremarkable.   Procedure: Right ear cerumen disimpaction Anesthesia: None Description: Under the operating microscope, the cerumen is carefully removed with a combination of cerumen currette, alligator forceps, and suction catheters.  After the cerumen is removed, the TMs are noted to be normal.  No mass, erythema, or lesions. The patient tolerated the procedure well.    Assessment: 1.  Recurrent dizziness, likely secondary to transient BPPV.  Her Dix-Hallpike maneuver is negative today. 2.  Right ear cerumen impaction.  After the disimpaction procedure, her tympanic membranes and middle ear spaces are noted to be normal.  No acute infection is noted today.  Plan: 1.  Otomicroscopy with right ear cerumen disimpaction. 2.  The physical exam findings are reviewed with the patient. 3.  The patient will return for reevaluation if her dizziness recurs. 4.  She is encouraged to call with any questions or concerns.

## 2024-02-08 DIAGNOSIS — I519 Heart disease, unspecified: Secondary | ICD-10-CM | POA: Diagnosis not present

## 2024-02-08 DIAGNOSIS — I25118 Atherosclerotic heart disease of native coronary artery with other forms of angina pectoris: Secondary | ICD-10-CM | POA: Diagnosis not present

## 2024-02-08 DIAGNOSIS — I361 Nonrheumatic tricuspid (valve) insufficiency: Secondary | ICD-10-CM | POA: Diagnosis not present

## 2024-02-08 DIAGNOSIS — I10 Essential (primary) hypertension: Secondary | ICD-10-CM | POA: Diagnosis not present

## 2024-02-21 ENCOUNTER — Telehealth: Payer: Self-pay | Admitting: Allergy & Immunology

## 2024-02-21 NOTE — Telephone Encounter (Signed)
 Patient called stating she is needing a refill on Flonase  and her albuterol  inhaler [facility] to [facility] [address].

## 2024-02-22 MED ORDER — FLUTICASONE PROPIONATE 50 MCG/ACT NA SUSP: 2.0000 | Freq: Every day | NASAL | 1 refills | Status: DC

## 2024-02-22 MED ORDER — ALBUTEROL SULFATE HFA 108 (90 BASE) MCG/ACT IN AERS: 2.0000 | INHALATION_SPRAY | RESPIRATORY_TRACT | 0 refills | Status: DC | PRN

## 2024-03-14 ENCOUNTER — Encounter: Payer: Self-pay | Admitting: Dermatology

## 2024-03-14 ENCOUNTER — Ambulatory Visit: Admitting: Dermatology

## 2024-03-14 VITALS — BP 110/70 | HR 73

## 2024-03-14 DIAGNOSIS — Z800 Family history of malignant neoplasm of digestive organs: Secondary | ICD-10-CM | POA: Insufficient documentation

## 2024-03-14 DIAGNOSIS — Z1211 Encounter for screening for malignant neoplasm of colon: Secondary | ICD-10-CM | POA: Insufficient documentation

## 2024-03-14 DIAGNOSIS — L729 Follicular cyst of the skin and subcutaneous tissue, unspecified: Secondary | ICD-10-CM

## 2024-03-14 DIAGNOSIS — L720 Epidermal cyst: Secondary | ICD-10-CM | POA: Diagnosis not present

## 2024-03-14 DIAGNOSIS — R430 Anosmia: Secondary | ICD-10-CM | POA: Insufficient documentation

## 2024-03-14 DIAGNOSIS — Z860100 Personal history of colon polyps, unspecified: Secondary | ICD-10-CM | POA: Insufficient documentation

## 2024-03-14 DIAGNOSIS — R1033 Periumbilical pain: Secondary | ICD-10-CM | POA: Insufficient documentation

## 2024-03-14 DIAGNOSIS — K602 Anal fissure, unspecified: Secondary | ICD-10-CM | POA: Insufficient documentation

## 2024-03-14 DIAGNOSIS — R142 Eructation: Secondary | ICD-10-CM | POA: Insufficient documentation

## 2024-03-14 DIAGNOSIS — E559 Vitamin D deficiency, unspecified: Secondary | ICD-10-CM | POA: Insufficient documentation

## 2024-03-14 DIAGNOSIS — K625 Hemorrhage of anus and rectum: Secondary | ICD-10-CM | POA: Insufficient documentation

## 2024-03-14 DIAGNOSIS — R634 Abnormal weight loss: Secondary | ICD-10-CM | POA: Insufficient documentation

## 2024-03-14 DIAGNOSIS — K146 Glossodynia: Secondary | ICD-10-CM | POA: Insufficient documentation

## 2024-03-14 DIAGNOSIS — M79641 Pain in right hand: Secondary | ICD-10-CM | POA: Insufficient documentation

## 2024-03-14 NOTE — Progress Notes (Signed)
   New Patient Visit   Subjective  Paula Pollard is a 89 y.o. female accompanied by her daughter Paula Pollard) who presents for the following: Bump  Patient states she has a Bump located at the left Axilla that she would like to have examined. Patient reports the areas have been there for 1 year. She reports the areas are not bothersome. Patient reports she has not previously been treated for these areas. Patient denies Hx of bx. Patient denies family history of skin cancer(s).  The following portions of the chart were reviewed this encounter and updated as appropriate: medications, allergies, medical history  Review of Systems:  No other skin or systemic complaints except as noted in HPI or Assessment and Plan.  Objective  Well appearing patient in no apparent distress; mood and affect are within normal limits.  A focused examination was performed of the following areas: Left Axilla  Relevant exam findings are noted in the Assessment and Plan.         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 The cyst is subcutaneous, measuring one centimeter, with a large punctum and dilated pore. It is not painful and has been previously drained. Likely due to a clogged oil gland, causing oil to be trapped beneath the skin. Manually expressed during the visit, resulting in flattening of the lesion. The pore remains open, indicating potential for recurrence. Surgical excision is the definitive treatment to prevent recurrence, but she prefers conservative management.  - Advise warm compresses during showering to soften the cyst. - Instruct to gently squeeze the cyst once a month to prevent hardening. - Educate that the cyst is not dangerous and can be left alone if preferred. - Schedule follow-up appointment to discuss other issues.   Return in about 6 months (around 09/12/2024) for TBSE.  I, Paula Pollard, am acting as Neurosurgeon  for [employer], DO.  Documentation: I have reviewed the above documentation for accuracy and completeness, and I agree with the above.  Delon Lenis, DO

## 2024-03-14 NOTE — Patient Instructions (Addendum)
 VISIT SUMMARY:  During today's visit, we discussed the subcutaneous cystic nodule under your right arm that has been present for about a year. We reviewed your history with similar cysts and examined the current one. The cyst was manually expressed during the visit, resulting in some flattening of the lesion.  YOUR PLAN:  -EPIDERMAL CYST OF RIGHT AXILLA:  An epidermal cyst is a non-cancerous bump beneath the skin, often caused by a clogged oil gland. The cyst under your right arm was manually expressed today, which helped flatten it.  To manage it conservatively, you should apply warm compresses during showering to soften the cyst and gently squeeze it once a month to prevent hardening.  The cyst is not dangerous and can be left alone if you prefer. Surgical removal is an option if it recurs, but you have chosen to manage it conservatively for now.  INSTRUCTIONS:  Please schedule a follow-up appointment to discuss any other issues you may have.   Important Information   Due to recent changes in healthcare laws, you may see results of your pathology and/or laboratory studies on MyChart before the doctors have had a chance to review them. We understand that in some cases there may be results that are confusing or concerning to you. Please understand that not all results are received at the same time and often the doctors may need to interpret multiple results in order to provide you with the best plan of care or course of treatment. Therefore, we ask that you please give us  2 business days to thoroughly review all your results before contacting the office for clarification. Should we see a critical lab result, you will be contacted sooner.     If You Need Anything After Your Visit   If you have any questions or concerns for your doctor, please call our main line at [phone]. If no one answers, please leave a voicemail as directed and we will return your call as soon as possible. Messages  left after 4 pm will be answered the following business day.    You may also send us  a message via MyChart. We typically respond to MyChart messages within 1-2 business days.  For prescription refills, please ask your pharmacy to contact our office. Our fax number is [phone].  If you have an urgent issue when the clinic is closed that cannot wait until the next business day, you can page your doctor at the number below.     Please note that while we do our best to be available for urgent issues outside of office hours, we are not available 24/7.    If you have an urgent issue and are unable to reach us , you may choose to seek medical care at your doctor's office, retail clinic, urgent care center, or emergency room.   If you have a medical emergency, please immediately call 911 or go to the emergency department. In the event of inclement weather, please call our main line at [phone] for an update on the status of any delays or closures.  Dermatology Medication Tips: Please keep the boxes that topical medications come in in order to help keep track of the instructions about where and how to use these. Pharmacies typically print the medication instructions only on the boxes and not directly on the medication tubes.   If your medication is too expensive, please contact our office at [phone] or send us  a message through MyChart.    We are unable to tell what  your co-pay for medications will be in advance as this is different depending on your insurance coverage. However, we may be able to find a substitute medication at lower cost or fill out paperwork to get insurance to cover a needed medication.    If a prior authorization is required to get your medication covered by your insurance company, please allow us  1-2 business days to complete this process.   Drug prices often vary depending on where the prescription is filled and some pharmacies may offer cheaper prices.   The  website www.goodrx.com contains coupons for medications through different pharmacies. The prices here do not account for what the cost may be with help from insurance (it may be cheaper with your insurance), but the website can give you the price if you did not use any insurance.  - You can print the associated coupon and take it with your prescription to the pharmacy.  - You may also stop by our office during regular business hours and pick up a GoodRx coupon card.  - If you need your prescription sent electronically to a different pharmacy, notify our office through [facility] or by phone at [phone]

## 2024-03-24 ENCOUNTER — Other Ambulatory Visit: Payer: Self-pay | Admitting: Internal Medicine

## 2024-03-24 NOTE — Telephone Encounter (Signed)
 Copied from CRM #[id number]. Topic: Clinical - Medication Refill >> Mar 24, 2024  4:06 PM Delon HERO wrote: Medication: hydrochlorothiazide  (HYDRODIURIL ) 12.5 MG tablet [[id number]]  Has the patient contacted their pharmacy? Yes (Agent: If no, request that the patient contact the pharmacy for the refill. If patient does not wish to contact the pharmacy document the reason why and proceed with request.) (Agent: If yes, when and what did the pharmacy advise?)  This is the patient's preferred pharmacy:  [id number] - [place], [place] - [address] AT [facility] [address] [place] [address] Phone: [phone] Fax: [phone]   Is this the correct pharmacy for this prescription? Yes If no, delete pharmacy and type the correct one.   Has the prescription been filled recently? Yes  Is the patient out of the medication? Yes  Has the patient been seen for an appointment in the last year OR does the patient have an upcoming appointment? Yes  Can we respond through MyChart? Yes  Agent: Please be advised that Rx refills may take up to 3 business days. We ask that you follow-up with your pharmacy.

## 2024-03-27 MED ORDER — HYDROCHLOROTHIAZIDE 12.5 MG PO TABS: 12.5000 mg | ORAL_TABLET | Freq: Every day | ORAL | 1 refills | Status: AC

## 2024-05-09 DIAGNOSIS — Z1231 Encounter for screening mammogram for malignant neoplasm of breast: Secondary | ICD-10-CM | POA: Diagnosis not present

## 2024-05-11 ENCOUNTER — Encounter: Payer: Self-pay | Admitting: Internal Medicine

## 2024-05-18 DIAGNOSIS — I519 Heart disease, unspecified: Secondary | ICD-10-CM | POA: Diagnosis not present

## 2024-05-18 DIAGNOSIS — I080 Rheumatic disorders of both mitral and aortic valves: Secondary | ICD-10-CM | POA: Diagnosis not present

## 2024-05-18 DIAGNOSIS — I10 Essential (primary) hypertension: Secondary | ICD-10-CM | POA: Diagnosis not present

## 2024-05-18 DIAGNOSIS — I25118 Atherosclerotic heart disease of native coronary artery with other forms of angina pectoris: Secondary | ICD-10-CM | POA: Diagnosis not present

## 2024-05-19 DIAGNOSIS — M25562 Pain in left knee: Secondary | ICD-10-CM | POA: Diagnosis not present

## 2024-05-19 DIAGNOSIS — M542 Cervicalgia: Secondary | ICD-10-CM | POA: Diagnosis not present

## 2024-05-26 ENCOUNTER — Telehealth: Payer: Self-pay | Admitting: Allergy & Immunology

## 2024-05-26 NOTE — Telephone Encounter (Signed)
 PT called to get RX refill for Albuterol , I advised needed an APT per last AVS, scheduled for this Monday 05/29/24 w/NP Arlean  PT still asked [facility], I advised would send back to clinic

## 2024-05-28 NOTE — Patient Instructions (Incomplete)
 Asthma Continue Breztri 2 puffs twice a day with a spacer to prevent cough or wheeze Continue albuterol  2 puffs once every 4 hours if needed for cough or wheeze You may use albuterol  2 puffs 5-15 minutes before activity to decrease cough or wheeze   Allergic rhinitis Continue allergen avoidance measures directed toward perennial mold, cockroach, and dust mite as listed below Continue Dymista  2 sprays in each nostril up to twice a day if needed for nasal symptoms Consider saline nasal rinses as needed for nasal symptoms. Use this before any medicated nasal sprays for best result  Allergic conjunctivitis Some over the counter eye drops include Pataday  one drop in each eye once a day as needed for red, itchy eyes OR Zaditor one drop in each eye twice a day as needed for red itchy eyes. Avoid eye drops that say red eye relief as they may contain medications that dry out your eyes.  Continue a lubricating eyedrop as needed for dry eyes  Vertigo Continue to follow-up with Dr. Karis as recommended  Rash Continue a daily moisturizing routine.  Begin Eucrisa to red and itchy areas up to twice a day if needed (samples provided) Continue to follow up with your dermatology specialist as recommended.   Call the clinic if this treatment plan is not working well for you.  Follow up in 6 months or sooner if needed.  Control of Mold Allergen Mold and fungi can grow on a variety of surfaces provided certain temperature and moisture conditions exist.  Outdoor molds grow on plants, decaying vegetation and soil.  The major outdoor mold, Alternaria and Cladosporium, are found in very high numbers during hot and dry conditions.  Generally, a late Summer - Fall peak is seen for common outdoor fungal spores.  Rain will temporarily lower outdoor mold spore count, but counts rise rapidly when the rainy period ends.  The most important indoor molds are Aspergillus and Penicillium.  Dark, humid and poorly ventilated  basements are ideal sites for mold growth.  The next most common sites of mold growth are the bathroom and the kitchen.  Outdoor [employer] Use air conditioning and keep windows closed Avoid exposure to decaying vegetation. Avoid leaf raking. Avoid grain handling. Consider wearing a face mask if working in moldy areas.  Indoor Mold Control Maintain humidity below 50%. Clean washable surfaces with 5% bleach solution. Remove sources e.g. Contaminated carpets.  Control of Cockroach Allergen Cockroach allergen has been identified as an important cause of acute attacks of asthma, especially in urban settings.  There are fifty-five species of cockroach that exist in the [place] , however only three, the American, German and Oriental species produce allergen that can affect patients with Asthma.  Allergens can be obtained from fecal particles, egg casings and secretions from cockroaches.    Remove food sources. Reduce access to water. Seal access and entry points. Spray runways with 0.5-1% Diazinon or Chlorpyrifos Blow boric acid power under stoves and refrigerator. Place bait stations (hydramethylnon) at feeding sites.    Control of Dust Mite Allergen Dust mites play a major role in allergic asthma and rhinitis. They occur in environments with high humidity wherever human skin is found. Dust mites absorb humidity from the atmosphere (ie, they do not drink) and feed on organic matter (including shed human and animal skin). Dust mites are a microscopic type of insect that you cannot see with the naked eye. High levels of dust mites have been detected from mattresses, pillows, carpets, upholstered  furniture, bed covers, clothes, soft toys and any woven material. The principal allergen of the dust mite is found in its feces. A gram of dust may contain 1,000 mites and 250,000 fecal particles. Mite antigen is easily measured in the air during house cleaning activities. Dust mites do not bite  and do not cause harm to humans, other than by triggering allergies/asthma.  Ways to decrease your exposure to dust mites in your home:  1. Encase mattresses, box springs and pillows with a mite-impermeable barrier or cover  2. Wash sheets, blankets and drapes weekly in hot water (130 F) with detergent and dry them in a dryer on the hot setting.  3. Have the room cleaned frequently with a vacuum cleaner and a damp dust-mop. For carpeting or rugs, vacuuming with a vacuum cleaner equipped with a high-efficiency particulate air (HEPA) filter. The dust mite allergic individual should not be in a room which is being cleaned and should wait 1 hour after cleaning before going into the room.  4. Do not sleep on upholstered furniture (eg, couches).  5. If possible removing carpeting, upholstered furniture and drapery from the home is ideal. Horizontal blinds should be eliminated in the rooms where the person spends the most time (bedroom, study, television room). Washable vinyl, roller-type shades are optimal.  6. Remove all non-washable stuffed toys from the bedroom. Wash stuffed toys weekly like sheets and blankets above.  7. Reduce indoor humidity to less than 50%. Inexpensive humidity monitors can be purchased at most hardware stores. Do not use a humidifier as can make the problem worse and are not recommended.

## 2024-05-28 NOTE — Progress Notes (Unsigned)
" ° °  [address] [place] [address] Dept: [phone]  FOLLOW UP NOTE  Patient ID: Paula Pollard, female    DOB: [birth year]  Age: 89 y.o. MRN: [id number] Date of Office Visit: 05/29/2024  Assessment  Chief Complaint: No chief complaint on file.  HPI Paula Pollard is a 89 year old female who presents to the clinic for follow-up visit.  She was last seen in this clinic on 01/12/2024 by Dr. Iva for evaluation of asthma, allergic rhinitis, allergic conjunctivitis, and vertigo for which she follows up with Dr. Karis.  Discussed the use of AI scribe software for clinical note transcription with the patient, who gave verbal consent to proceed.  History of Present Illness      Drug Allergies:  Allergies[1]  Physical Exam: There were no vitals taken for this visit.   Physical Exam  Diagnostics:    Assessment and Plan: No diagnosis found.  No orders of the defined types were placed in this encounter.   There are no Patient Instructions on file for this visit.  No follow-ups on file.    Thank you for the opportunity to care for this patient.  Please do not hesitate to contact me with questions.  Arlean Mutter, FNP [facility]           [1]  Allergies Allergen Reactions   Codeine Nausea Only and Other (See Comments)    Reaction:Dizziness and hallucinations makes me climb walls   Doxycycline  Other (See Comments)   "

## 2024-05-29 ENCOUNTER — Encounter: Payer: Self-pay | Admitting: Family Medicine

## 2024-05-29 ENCOUNTER — Ambulatory Visit (INDEPENDENT_AMBULATORY_CARE_PROVIDER_SITE_OTHER): Admitting: Family Medicine

## 2024-05-29 ENCOUNTER — Other Ambulatory Visit: Payer: Self-pay

## 2024-05-29 VITALS — BP 130/70 | HR 72 | Temp 97.6°F

## 2024-05-29 DIAGNOSIS — R42 Dizziness and giddiness: Secondary | ICD-10-CM

## 2024-05-29 DIAGNOSIS — H1010 Acute atopic conjunctivitis, unspecified eye: Secondary | ICD-10-CM | POA: Diagnosis not present

## 2024-05-29 DIAGNOSIS — J4540 Moderate persistent asthma, uncomplicated: Secondary | ICD-10-CM | POA: Diagnosis not present

## 2024-05-29 DIAGNOSIS — J3089 Other allergic rhinitis: Secondary | ICD-10-CM | POA: Diagnosis not present

## 2024-05-29 DIAGNOSIS — R21 Rash and other nonspecific skin eruption: Secondary | ICD-10-CM | POA: Diagnosis not present

## 2024-05-29 MED ORDER — ALBUTEROL SULFATE HFA 108 (90 BASE) MCG/ACT IN AERS: 2.0000 | INHALATION_SPRAY | RESPIRATORY_TRACT | 0 refills | Status: AC | PRN

## 2024-05-29 MED ORDER — FLUTICASONE PROPIONATE 50 MCG/ACT NA SUSP: 2.0000 | Freq: Every day | NASAL | 1 refills | Status: AC

## 2024-05-29 MED ORDER — AZELASTINE HCL 137 MCG/SPRAY NA SOLN: 2.0000 | Freq: Two times a day (BID) | NASAL | 5 refills | Status: AC | PRN

## 2024-05-30 ENCOUNTER — Ambulatory Visit: Payer: Self-pay

## 2024-05-31 ENCOUNTER — Ambulatory Visit: Payer: Medicare Other | Admitting: Family Medicine

## 2024-05-31 ENCOUNTER — Ambulatory Visit: Payer: Medicare Other

## 2024-08-02 ENCOUNTER — Ambulatory Visit

## 2024-08-02 ENCOUNTER — Ambulatory Visit: Payer: Self-pay | Admitting: Internal Medicine

## 2024-08-09 ENCOUNTER — Ambulatory Visit

## 2024-09-12 ENCOUNTER — Ambulatory Visit: Admitting: Dermatology

## 2024-11-21 ENCOUNTER — Ambulatory Visit: Admitting: Allergy & Immunology
